# Patient Record
Sex: Female | Born: 1960 | Race: White | Hispanic: No | Marital: Married | Smoking: Current every day smoker
Health system: Southern US, Community
[De-identification: ages and names within clinical notes are randomized; demographics above are authoritative.]

## PROBLEM LIST (undated history)

## (undated) DIAGNOSIS — Z720 Tobacco use: Secondary | ICD-10-CM

## (undated) DIAGNOSIS — R011 Cardiac murmur, unspecified: Secondary | ICD-10-CM

## (undated) DIAGNOSIS — I358 Other nonrheumatic aortic valve disorders: Secondary | ICD-10-CM

## (undated) DIAGNOSIS — I10 Essential (primary) hypertension: Secondary | ICD-10-CM

## (undated) DIAGNOSIS — F419 Anxiety disorder, unspecified: Secondary | ICD-10-CM

## (undated) DIAGNOSIS — Z8249 Family history of ischemic heart disease and other diseases of the circulatory system: Secondary | ICD-10-CM

## (undated) DIAGNOSIS — I5189 Other ill-defined heart diseases: Secondary | ICD-10-CM

## (undated) DIAGNOSIS — T7840XA Allergy, unspecified, initial encounter: Secondary | ICD-10-CM

## (undated) DIAGNOSIS — H9190 Unspecified hearing loss, unspecified ear: Secondary | ICD-10-CM

## (undated) DIAGNOSIS — D649 Anemia, unspecified: Secondary | ICD-10-CM

## (undated) DIAGNOSIS — E785 Hyperlipidemia, unspecified: Secondary | ICD-10-CM

## (undated) DIAGNOSIS — F1910 Other psychoactive substance abuse, uncomplicated: Secondary | ICD-10-CM

## (undated) DIAGNOSIS — I739 Peripheral vascular disease, unspecified: Secondary | ICD-10-CM

## (undated) DIAGNOSIS — Z9289 Personal history of other medical treatment: Secondary | ICD-10-CM

## (undated) HISTORY — DX: Other psychoactive substance abuse, uncomplicated: F19.10

## (undated) HISTORY — DX: Other nonrheumatic aortic valve disorders: I35.8

## (undated) HISTORY — DX: Tobacco use: Z72.0

## (undated) HISTORY — DX: Hyperlipidemia, unspecified: E78.5

## (undated) HISTORY — DX: Family history of ischemic heart disease and other diseases of the circulatory system: Z82.49

## (undated) HISTORY — DX: Allergy, unspecified, initial encounter: T78.40XA

## (undated) HISTORY — PX: CHOLECYSTECTOMY: SHX55

## (undated) HISTORY — DX: Personal history of other medical treatment: Z92.89

## (undated) HISTORY — PX: SKIN CANCER EXCISION: SHX779

## (undated) HISTORY — DX: Essential (primary) hypertension: I10

## (undated) HISTORY — DX: Anxiety disorder, unspecified: F41.9

## (undated) HISTORY — PX: ABDOMINAL HYSTERECTOMY: SHX81

## (undated) HISTORY — DX: Cardiac murmur, unspecified: R01.1

## (undated) HISTORY — DX: Other ill-defined heart diseases: I51.89

---

## 2008-02-29 ENCOUNTER — Ambulatory Visit: Payer: Self-pay | Admitting: Unknown Physician Specialty

## 2011-07-22 LAB — CBC
HGB: 16.4 g/dL — ABNORMAL HIGH (ref 12.0–16.0)
MCH: 32.4 pg (ref 26.0–34.0)
MCHC: 32.5 g/dL (ref 32.0–36.0)
MCV: 100 fL (ref 80–100)
RBC: 5.07 10*6/uL (ref 3.80–5.20)
WBC: 11.7 10*3/uL — ABNORMAL HIGH (ref 3.6–11.0)

## 2011-07-22 LAB — COMPREHENSIVE METABOLIC PANEL
Alkaline Phosphatase: 107 U/L (ref 50–136)
Anion Gap: 11 (ref 7–16)
Bilirubin,Total: 0.5 mg/dL (ref 0.2–1.0)
Calcium, Total: 9.7 mg/dL (ref 8.5–10.1)
Co2: 27 mmol/L (ref 21–32)
Creatinine: 0.78 mg/dL (ref 0.60–1.30)
Osmolality: 282 (ref 275–301)
Potassium: 3.7 mmol/L (ref 3.5–5.1)
SGOT(AST): 16 U/L (ref 15–37)
SGPT (ALT): 20 U/L
Sodium: 141 mmol/L (ref 136–145)

## 2011-07-22 LAB — URINALYSIS, COMPLETE
Hyaline Cast: 6
Nitrite: NEGATIVE
Protein: 100
RBC,UR: 3 /HPF (ref 0–5)
Specific Gravity: 1.028 (ref 1.003–1.030)

## 2011-07-23 ENCOUNTER — Inpatient Hospital Stay: Payer: Self-pay | Admitting: Internal Medicine

## 2011-07-23 DIAGNOSIS — I059 Rheumatic mitral valve disease, unspecified: Secondary | ICD-10-CM

## 2011-07-23 LAB — CK TOTAL AND CKMB (NOT AT ARMC)
CK, Total: 42 U/L (ref 21–215)
CK, Total: 50 U/L (ref 21–215)
CK-MB: 1.4 ng/mL (ref 0.5–3.6)
CK-MB: 0.8 ng/mL (ref 0.5–3.6)

## 2011-07-23 LAB — DRUG SCREEN, URINE
Amphetamines, Ur Screen: NEGATIVE (ref ?–1000)
Benzodiazepine, Ur Scrn: NEGATIVE (ref ?–200)
Methadone, Ur Screen: NEGATIVE (ref ?–300)
Opiate, Ur Screen: POSITIVE (ref ?–300)
Tricyclic, Ur Screen: NEGATIVE (ref ?–1000)

## 2011-07-23 LAB — PRO B NATRIURETIC PEPTIDE: B-Type Natriuretic Peptide: 503 pg/mL — ABNORMAL HIGH (ref 0–125)

## 2011-07-23 LAB — TROPONIN I: Troponin-I: <0.02 ng/mL

## 2011-08-04 ENCOUNTER — Ambulatory Visit (INDEPENDENT_AMBULATORY_CARE_PROVIDER_SITE_OTHER): Payer: PRIVATE HEALTH INSURANCE | Admitting: Family Medicine

## 2011-08-04 ENCOUNTER — Encounter: Payer: Self-pay | Admitting: Family Medicine

## 2011-08-04 VITALS — BP 158/78 | HR 72 | Temp 98.0°F | Ht 65.0 in | Wt 201.0 lb

## 2011-08-04 DIAGNOSIS — F419 Anxiety disorder, unspecified: Secondary | ICD-10-CM | POA: Insufficient documentation

## 2011-08-04 DIAGNOSIS — I10 Essential (primary) hypertension: Secondary | ICD-10-CM

## 2011-08-04 DIAGNOSIS — I152 Hypertension secondary to endocrine disorders: Secondary | ICD-10-CM | POA: Insufficient documentation

## 2011-08-04 DIAGNOSIS — F411 Generalized anxiety disorder: Secondary | ICD-10-CM

## 2011-08-04 LAB — COMPREHENSIVE METABOLIC PANEL
ALT: 30 U/L (ref 0–35)
AST: 21 U/L (ref 0–37)
Creatinine, Ser: 0.8 mg/dL (ref 0.4–1.2)
Total Bilirubin: 0.4 mg/dL (ref 0.3–1.2)

## 2011-08-04 MED ORDER — HYDROCHLOROTHIAZIDE 50 MG PO TABS: 50.0000 mg | ORAL_TABLET | Freq: Every day | ORAL | Status: DC

## 2011-08-04 MED ORDER — ALPRAZOLAM 0.25 MG PO TABS: 0.2500 mg | ORAL_TABLET | Freq: Three times a day (TID) | ORAL | Status: DC | PRN

## 2011-08-04 MED ORDER — METOPROLOL SUCCINATE ER 50 MG PO TB24: 50.0000 mg | ORAL_TABLET | Freq: Every day | ORAL | Status: DC

## 2011-08-04 MED ORDER — POTASSIUM CHLORIDE ER 10 MEQ PO TBCR: 10.0000 meq | EXTENDED_RELEASE_TABLET | Freq: Every day | ORAL | Status: DC

## 2011-08-04 NOTE — Patient Instructions (Addendum)
We are starting potassium - 10 meq daily. We are also starting xanax 0.25 mg three times daily as needed. Please follow up with me in 2 weeks.

## 2011-08-04 NOTE — Progress Notes (Signed)
Subjective:    Patient ID: Sara Roy, female    DOB: 1960/06/29, 51 y.o.   MRN: [id number]  HPI  51 yo here to establish care and for hospital follow up.  Presented to [facility] on 6/14 with "worst head of her life x 6 days."  Had not bee to a physician in many years. Under significant stress and assumed that is why she felt so bad.  Also had been taking OTC decongestants/sudafed.  ER notes reviewed-  Upon arrival BP was 244/134! Given IV hydralazine, norvasc with only minimal improvement.  MRI of brain- No acute findings Head CT without contrast- no acute findings 2 D echo- LVEF 55%, moderate LV hypertrophy  Admitted to hospital and started on coreg, lisinopril and norvasc. Unclear why she was discharged home on HCTZ 50 mg (no potassium supplementation) and Metoprolol 50 mg daily.  Pt unsure as well.  HA has improved a little. Has been normotensive at home- BP running in 120s- 130s systolic.  Has been having panic attacks- and is frequently tearful. No SI or HI.  Patient Active Problem List  Diagnosis  . Anxiety  . Hypertension   Past Medical History  Diagnosis Date  . Anxiety   . Hypertension   . Family history of early CAD   . Tobacco abuse    Past Surgical History  Procedure Date  . Cholecystectomy   . Abdominal hysterectomy    History  Substance Use Topics  . Smoking status: Current Everyday Smoker  . Smokeless tobacco: Not on file  . Alcohol Use: Not on file   Family History  Problem Relation Age of Onset  . Heart disease Father    Allergies  Allergen Reactions  . Penicillins Hives   Current Outpatient Prescriptions on File Prior to Visit  Medication Sig Dispense Refill  . potassium chloride (K-DUR) 10 MEQ tablet Take 1 tablet (10 mEq total) by mouth daily.  30 tablet  1   The PMH, PSH, Social History, Family History, Medications, and allergies have been reviewed in [facility], and have been updated if relevant.    Review of Systems    See  HPI Objective:   Physical Exam BP 158/78  Pulse 72  Temp 98 F (36.7 C)  Ht 5\' 5"  (1.651 m)  Wt 201 lb (91.173 kg)  BMI 33.45 kg/m2  General:  Well-developed,well-nourished,in no acute distress; alert,appropriate and cooperative throughout examination Head:  normocephalic and atraumatic.   Eyes:  vision grossly intact, pupils equal, pupils round, and pupils reactive to light.   Ears:  R ear normal and L ear normal.   Nose:  no external deformity.   Mouth:  good dentition.   Neck:  No deformities, masses, or tenderness noted. Lungs:  Normal respiratory effort, chest expands symmetrically. Lungs are clear to auscultation, no crackles or wheezes. Heart:  Normal rate and regular rhythm. S1 and S2 normal without gallop, murmur, click, rub or other extra sounds. Msk:  No deformity or scoliosis noted of thoracic or lumbar spine.   Extremities:  No clubbing, cyanosis, edema, or deformity noted with normal full range of motion of all joints.   Neurologic:  alert & oriented X3 and gait normal.   Skin:  Intact without suspicious lesions or rashes Psych:  Cognition and judgment appear intact. Alert and cooperative with normal attention span and concentration. No apparent delusions, illusions, hallucinations      Assessment & Plan:   1. HTN (hypertension)  Complicated situation- unclear as to why she was  sent on her current medications. BP improved- will recheck CMET today since she was not sent home with potassium but is on high dose HCTZ. Start Kdur 10 meq daily. Follow up in 2 weeks. The patient indicates understanding of these issues and agrees with the plan.  Comprehensive metabolic panel  2. Family h/o CAD- Very high risk- she is also a smoker.  I cannot find a lipid panel in hospital records. Will order lipids in 2 weeks.    3. Anxiety  Deteriorated. Discussed different tx options. Will start xanax for now- as needed for panic attacks.  Reasses in two weeks.  She does not yet  want to start SSRI.  Has had issues with ETOH abuse in past, advised against drinking ETOH with benzo. The patient indicates understanding of these issues and agrees with the plan.

## 2011-08-18 ENCOUNTER — Encounter: Payer: Self-pay | Admitting: Family Medicine

## 2011-08-18 ENCOUNTER — Ambulatory Visit (INDEPENDENT_AMBULATORY_CARE_PROVIDER_SITE_OTHER): Payer: PRIVATE HEALTH INSURANCE | Admitting: Family Medicine

## 2011-08-18 ENCOUNTER — Other Ambulatory Visit: Payer: Self-pay | Admitting: Family Medicine

## 2011-08-18 VITALS — BP 142/84 | HR 72 | Temp 98.1°F | Wt 204.0 lb

## 2011-08-18 DIAGNOSIS — F419 Anxiety disorder, unspecified: Secondary | ICD-10-CM

## 2011-08-18 DIAGNOSIS — D72829 Elevated white blood cell count, unspecified: Secondary | ICD-10-CM | POA: Insufficient documentation

## 2011-08-18 DIAGNOSIS — R5381 Other malaise: Secondary | ICD-10-CM

## 2011-08-18 DIAGNOSIS — I10 Essential (primary) hypertension: Secondary | ICD-10-CM

## 2011-08-18 DIAGNOSIS — Z136 Encounter for screening for cardiovascular disorders: Secondary | ICD-10-CM

## 2011-08-18 DIAGNOSIS — R5383 Other fatigue: Secondary | ICD-10-CM | POA: Insufficient documentation

## 2011-08-18 DIAGNOSIS — D582 Other hemoglobinopathies: Secondary | ICD-10-CM

## 2011-08-18 DIAGNOSIS — F411 Generalized anxiety disorder: Secondary | ICD-10-CM

## 2011-08-18 LAB — CBC WITH DIFFERENTIAL/PLATELET
Basophils Absolute: 0 10*3/uL (ref 0.0–0.1)
Basophils Relative: 0.4 % (ref 0.0–3.0)
Eosinophils Absolute: 0.3 10*3/uL (ref 0.0–0.7)
Lymphocytes Relative: 26.7 % (ref 12.0–46.0)
MCHC: 33.8 g/dL (ref 30.0–36.0)
Neutrophils Relative %: 64.2 % (ref 43.0–77.0)
Platelets: 316 10*3/uL (ref 150.0–400.0)
RBC: 5.32 Mil/uL — ABNORMAL HIGH (ref 3.87–5.11)

## 2011-08-18 LAB — COMPREHENSIVE METABOLIC PANEL
AST: 20 U/L (ref 0–37)
Albumin: 3.8 g/dL (ref 3.5–5.2)
BUN: 20 mg/dL (ref 6–23)
Calcium: 9.7 mg/dL (ref 8.4–10.5)
Chloride: 97 mEq/L (ref 96–112)
Glucose, Bld: 111 mg/dL — ABNORMAL HIGH (ref 70–99)
Potassium: 4.1 mEq/L (ref 3.5–5.1)

## 2011-08-18 LAB — TSH: TSH: 1 u[IU]/mL (ref 0.35–5.50)

## 2011-08-18 LAB — LIPID PANEL: VLDL: 32.2 mg/dL (ref 0.0–40.0)

## 2011-08-18 LAB — LDL CHOLESTEROL, DIRECT: Direct LDL: 162.7 mg/dL

## 2011-08-18 MED ORDER — LISINOPRIL-HYDROCHLOROTHIAZIDE 20-25 MG PO TABS: 1.0000 | ORAL_TABLET | Freq: Every day | ORAL | Status: DC

## 2011-08-18 NOTE — Patient Instructions (Addendum)
It was good to see you. Please stop the metoprolol, HCTZ and Kdur (potassium). We are starting a new blood pressure medication. Please come see me in 1-2 weeks.

## 2011-08-18 NOTE — Progress Notes (Signed)
Subjective:    Patient ID: Sara Roy, female    DOB: [birth year], 51 y.o.   MRN: [id number]  HPI  51 yo here for BP follow up. Initially saw her two weeks ago for hospital follow up.  Presented to [facility] on 6/14 with "worst head of her life x 6 days."  Had not bee to a physician in many years. Under significant stress and assumed that is why she felt so bad.  Also had been taking OTC decongestants/sudafed.  ER notes reviewed-  Upon arrival BP was 244/134! Given IV hydralazine, norvasc with only minimal improvement.  MRI of brain- No acute findings Head CT without contrast- no acute findings 2 D echo- LVEF 55%, moderate LV hypertrophy  Admitted to hospital and started on coreg, lisinopril and norvasc. Unclear why she was discharged home on HCTZ 50 mg (no potassium supplementation) and Metoprolol 50 mg daily- ? Possible adverse rxn.  Pt unsure as well, said she vomited once but had not eaten in two days. Nothing in hospital records indicate and adverse reaction.  Started Kdur at last office visit and continued previous medication. Lab Results  Component Value Date   NA 140 08/04/2011   K 3.5 08/04/2011   CL 96 08/04/2011   CO2 35* 08/04/2011     Has been having panic attacks- and is frequently tearful. No SI or HI. Started Xanax as needed at last office visit.  Overall, HA has improved but she feels more fatigued. No SOB or CP.  Patient Active Problem List  Diagnosis  . Anxiety  . Hypertension   Past Medical History  Diagnosis Date  . Anxiety   . Hypertension   . Family history of early CAD   . Tobacco abuse    Past Surgical History  Procedure Date  . Cholecystectomy   . Abdominal hysterectomy    History  Substance Use Topics  . Smoking status: Current Everyday Smoker  . Smokeless tobacco: Not on file  . Alcohol Use: Not on file   Family History  Problem Relation Age of Onset  . Heart disease Father    Allergies  Allergen Reactions  . Penicillins Hives     Current Outpatient Prescriptions on File Prior to Visit  Medication Sig Dispense Refill  . ALPRAZolam (XANAX) 0.25 MG tablet Take 1 tablet (0.25 mg total) by mouth 3 (three) times daily as needed for sleep or anxiety.  90 tablet  0  . aspirin 325 MG tablet Take 325 mg by mouth daily.      . hydrochlorothiazide (HYDRODIURIL) 50 MG tablet Take 1 tablet (50 mg total) by mouth daily.      . metoprolol succinate (TOPROL-XL) 50 MG 24 hr tablet Take 1 tablet (50 mg total) by mouth daily. Take with or immediately following a meal.      . potassium chloride (K-DUR) 10 MEQ tablet Take 1 tablet (10 mEq total) by mouth daily.  30 tablet  1   The PMH, PSH, Social History, Family History, Medications, and allergies have been reviewed in [facility], and have been updated if relevant.    Review of Systems    See HPI Objective:   Physical Exam BP 142/84  Pulse 72  Temp 98.1 F (36.7 C)  Wt 204 lb (92.534 kg)  General:  Well-developed,well-nourished,in no acute distress; alert,appropriate and cooperative throughout examination Head:  normocephalic and atraumatic.   Eyes:  vision grossly intact, pupils equal, pupils round, and pupils reactive to light.   Ears:  R ear  normal and L ear normal.   Nose:  no external deformity.   Mouth:  good dentition.   Neck:  No deformities, masses, or tenderness noted. Lungs:  Normal respiratory effort, chest expands symmetrically. Lungs are clear to auscultation, no crackles or wheezes. Heart:  Normal rate and regular rhythm. S1 and S2 normal without gallop, murmur, click, rub or other extra sounds. Msk:  No deformity or scoliosis noted of thoracic or lumbar spine.   Extremities:  No clubbing, cyanosis, edema, or deformity noted with normal full range of motion of all joints.   Neurologic:  alert & oriented X3 and gait normal.   Skin:  Intact without suspicious lesions or rashes Psych:  Cognition and judgment appear intact. Alert and cooperative with normal attention  span and concentration. No apparent delusions, illusions, hallucinations      Assessment & Plan:   1. HTN (hypertension)  Improved but now with profound fatigue.  Remains unclear as to why she was sent on her current medications. Since no adverse reaction documented to ACEI and pt only states that she vomited once, will d/c metoprolol as it is likely contributing to her fatigue.  D/c HCTZ at current dose- pt reports urinating too frequently. Start ACEI- HCTZ combo- d/c Kdur as we have decreased dose of diuretic. Follow up in 1 week. Comprehensive metabolic panel  2. Family h/o CAD- Very high risk- she is also a smoker.   Lipid panel today.   3. Fatigue - see above. Likely multifactorial. Hopefully will improve after stopping metoprolol. Check labs to rule out other possible contributing factors. Orders Placed This Encounter  Procedures  . Lipid Panel  . Comprehensive metabolic panel  . CBC with Differential  . TSH  . Vitamin D, 25-hydroxy

## 2011-08-19 ENCOUNTER — Telehealth: Payer: Self-pay | Admitting: Family Medicine

## 2011-08-19 LAB — VITAMIN D 25 HYDROXY (VIT D DEFICIENCY, FRACTURES): Vit D, 25-Hydroxy: 20 ng/mL — ABNORMAL LOW (ref 30–89)

## 2011-08-19 MED ORDER — LOSARTAN POTASSIUM-HCTZ 50-12.5 MG PO TABS: 1.0000 | ORAL_TABLET | Freq: Every day | ORAL | Status: DC

## 2011-08-19 NOTE — Telephone Encounter (Signed)
Jacki Cones said to send to Dr Dayton Martes.

## 2011-08-19 NOTE — Telephone Encounter (Signed)
Caller: Honor/Patient; PCP: Ruthe Mannan (Nestor Ramp); CB#: [phone]; Call regarding Rash/Hives; Onset after first dose of new medication - Lisinopril/ HCTZ.  Rash noted on arms only so far, not itchy.  Splotchy rash primarily on forearms reported.   Emergent sx ruled out.  Call provider in 4 hours per Rash protocol.  Information noted and sent to office for follow up.  Confirmed pharmacy information with patient.

## 2011-08-19 NOTE — Telephone Encounter (Signed)
Called pt- instructed her to STOP the lisinopril/HCTZ although rash has improved. Will add lisinopril to her allergy list. Will send in rx for HCTZ-losartan. She has follow up appt scheduled with me.

## 2011-08-20 ENCOUNTER — Other Ambulatory Visit: Payer: Self-pay | Admitting: Family Medicine

## 2011-08-20 ENCOUNTER — Other Ambulatory Visit (INDEPENDENT_AMBULATORY_CARE_PROVIDER_SITE_OTHER): Payer: PRIVATE HEALTH INSURANCE

## 2011-08-20 DIAGNOSIS — D582 Other hemoglobinopathies: Secondary | ICD-10-CM

## 2011-08-20 MED ORDER — HYDROCHLOROTHIAZIDE 25 MG PO TABS: 25.0000 mg | ORAL_TABLET | Freq: Every day | ORAL | Status: DC

## 2011-08-20 MED ORDER — AMLODIPINE BESYLATE 5 MG PO TABS: 5.0000 mg | ORAL_TABLET | Freq: Every day | ORAL | Status: DC

## 2011-08-20 NOTE — Progress Notes (Signed)
Pt came in for lab draw- losartan label states not to take since she is allergic to PCN. Advised not to start it. Will add to allergy list. Called in rx for HCTZ and amlopidine. Follow up as planned.

## 2011-08-21 ENCOUNTER — Other Ambulatory Visit: Payer: Self-pay | Admitting: Family Medicine

## 2011-08-21 DIAGNOSIS — R7989 Other specified abnormal findings of blood chemistry: Secondary | ICD-10-CM

## 2011-08-21 LAB — CBC WITH DIFFERENTIAL/PLATELET
Basophils Absolute: 0 10*3/uL (ref 0.0–0.1)
HCT: 46 % (ref 36.0–46.0)
Lymphocytes Relative: 28 % (ref 12–46)
Neutro Abs: 7.4 10*3/uL (ref 1.7–7.7)
Neutrophils Relative %: 64 % (ref 43–77)
Platelets: 325 10*3/uL (ref 150–400)
RDW: 13.7 % (ref 11.5–15.5)
WBC: 11.6 10*3/uL — ABNORMAL HIGH (ref 4.0–10.5)

## 2011-08-21 LAB — D-DIMER, QUANTITATIVE: D-Dimer, Quant: 0.3 ug/mL-FEU (ref 0.00–0.48)

## 2011-09-01 ENCOUNTER — Ambulatory Visit (INDEPENDENT_AMBULATORY_CARE_PROVIDER_SITE_OTHER): Payer: PRIVATE HEALTH INSURANCE | Admitting: Family Medicine

## 2011-09-01 ENCOUNTER — Encounter: Payer: Self-pay | Admitting: Family Medicine

## 2011-09-01 ENCOUNTER — Ambulatory Visit: Payer: Self-pay | Admitting: Family Medicine

## 2011-09-01 VITALS — BP 142/82 | HR 84 | Temp 98.3°F | Wt 203.0 lb

## 2011-09-01 DIAGNOSIS — F411 Generalized anxiety disorder: Secondary | ICD-10-CM

## 2011-09-01 DIAGNOSIS — I10 Essential (primary) hypertension: Secondary | ICD-10-CM

## 2011-09-01 DIAGNOSIS — F419 Anxiety disorder, unspecified: Secondary | ICD-10-CM

## 2011-09-01 MED ORDER — POTASSIUM CHLORIDE ER 10 MEQ PO TBCR: 5.0000 meq | EXTENDED_RELEASE_TABLET | Freq: Every day | ORAL | Status: DC

## 2011-09-01 NOTE — Progress Notes (Signed)
Subjective:    Patient ID: Sara Roy, female    DOB: [birth year], 51 y.o.   MRN: [id number]  HPI  51 yo here for BP follow up. Initially saw her earlier this month for hospital follow up.  Presented to [facility] on 6/14 with "worst head of her life x 6 days."  Had not bee to a physician in many years. Under significant stress and assumed that is why she felt so bad.  Also had been taking OTC decongestants/sudafed.  ER notes reviewed-  Upon arrival BP was 244/134! Given IV hydralazine, norvasc with only minimal improvement.  MRI of brain- No acute findings Head CT without contrast- no acute findings 2 D echo- LVEF 55%, moderate LV hypertrophy  Admitted to hospital and started on coreg, lisinopril and norvasc. Unclear why she was discharged home on HCTZ 50 mg (no potassium supplementation) and Metoprolol 50 mg daily- ? Possible adverse rxn.  Pt unsure as well, said she vomited once but had not eaten in two days. Nothing in hospital records indicate and adverse reaction.  Attempted to start her on ACEI but she developed a rash. On 7/12, rx for amlodipine and HCTZ called in.  Lab Results  Component Value Date   NA 137 08/18/2011   K 4.1 08/18/2011   CL 97 08/18/2011   CO2 28 08/18/2011     Has been having panic attacks- and is frequently tearful. No SI or HI. Started Xanax as needed at last office visit.  Overall, HA has improved but she feels more fatigued. No SOB or CP.  Abnormal CBC- Lab Results  Component Value Date   WBC 11.6* 08/20/2011   HGB 16.3* 08/20/2011   HCT 46.0 08/20/2011   MCV 87.8 08/20/2011   PLT 325 08/20/2011   Elevated Hgb and WBC.  D dimer negative.  Stable- heavy smoker.  Patient Active Problem List  Diagnosis  . Anxiety  . Hypertension  . Fatigue  . Leukocytosis  . Elevated hemoglobin   Past Medical History  Diagnosis Date  . Anxiety   . Hypertension   . Family history of early CAD   . Tobacco abuse    Past Surgical History  Procedure Date    . Cholecystectomy   . Abdominal hysterectomy    History  Substance Use Topics  . Smoking status: Current Everyday Smoker  . Smokeless tobacco: Not on file  . Alcohol Use: Not on file   Family History  Problem Relation Age of Onset  . Heart disease Father    Allergies  Allergen Reactions  . Lisinopril     Rash   . Losartan   . Penicillins Hives   Current Outpatient Prescriptions on File Prior to Visit  Medication Sig Dispense Refill  . ALPRAZolam (XANAX) 0.25 MG tablet Take 1 tablet (0.25 mg total) by mouth 3 (three) times daily as needed for sleep or anxiety.  90 tablet  0  . amLODipine (NORVASC) 5 MG tablet Take 1 tablet (5 mg total) by mouth daily.  90 tablet  3  . aspirin 325 MG tablet Take 325 mg by mouth daily.      . hydrochlorothiazide (HYDRODIURIL) 25 MG tablet Take 1 tablet (25 mg total) by mouth daily.  30 tablet  1   The PMH, PSH, Social History, Family History, Medications, and allergies have been reviewed in [facility], and have been updated if relevant.    Review of Systems    See HPI Objective:   Physical Exam BP 142/82  Pulse  84  Temp 98.3 F (36.8 C)  Wt 203 lb (92.08 kg)  General:  Well-developed,well-nourished,in no acute distress; alert,appropriate and cooperative throughout examination Head:  normocephalic and atraumatic.   Eyes:  vision grossly intact, pupils equal, pupils round, and pupils reactive to light.   Ears:  R ear normal and L ear normal.   Nose:  no external deformity.   Mouth:  good dentition.   Neck:  No deformities, masses, or tenderness noted. Lungs:  Normal respiratory effort, chest expands symmetrically. Lungs are clear to auscultation, no crackles or wheezes. Heart:  Normal rate and regular rhythm. S1 and S2 normal without gallop, murmur, click, rub or other extra sounds. Msk:  No deformity or scoliosis noted of thoracic or lumbar spine.   Extremities:  No clubbing, cyanosis, edema, or deformity noted with normal full range of  motion of all joints.   Neurologic:  alert & oriented X3 and gait normal.   Skin:  Intact without suspicious lesions or rashes Psych:  Cognition and judgment appear intact. Alert and cooperative with normal attention span and concentration. No apparent delusions, illusions, hallucinations      Assessment & Plan:   1. HTN (hypertension)  Improved and now feeling less fatigued. Follow up in 2-3 months.   2. Abnormal CBC Likely due to long term tobacco use. Repeat labs scheduled for next month.

## 2011-09-01 NOTE — Patient Instructions (Addendum)
Good to see you. I will call you with your lab results in August. Please come see me in a few months and we address the concerns we talked about.

## 2011-09-09 ENCOUNTER — Telehealth: Payer: Self-pay | Admitting: *Deleted

## 2011-09-09 DIAGNOSIS — M549 Dorsalgia, unspecified: Secondary | ICD-10-CM

## 2011-09-09 NOTE — Telephone Encounter (Signed)
Order entered

## 2011-09-09 NOTE — Telephone Encounter (Signed)
Patient called and asked that you go ahead and refer her for MRI of her back. Her pain is still there and she would like to know the source. It isn't any worse, but it isn't any better.

## 2011-09-09 NOTE — Telephone Encounter (Signed)
We would need to get an xray first.  Please have her come for lumbar xray or office visit.

## 2011-09-09 NOTE — Telephone Encounter (Signed)
Advised patient.  She will come in tomorrow for x-ray.

## 2011-09-10 ENCOUNTER — Other Ambulatory Visit: Payer: Self-pay | Admitting: Family Medicine

## 2011-09-10 ENCOUNTER — Ambulatory Visit (INDEPENDENT_AMBULATORY_CARE_PROVIDER_SITE_OTHER)
Admission: RE | Admit: 2011-09-10 | Discharge: 2011-09-10 | Disposition: A | Discharge status: Home / Self Care | Payer: PRIVATE HEALTH INSURANCE | Source: Ambulatory Visit | Attending: Family Medicine | Admitting: Family Medicine

## 2011-09-10 DIAGNOSIS — M549 Dorsalgia, unspecified: Secondary | ICD-10-CM

## 2011-09-10 DIAGNOSIS — M545 Low back pain: Secondary | ICD-10-CM

## 2011-09-13 ENCOUNTER — Other Ambulatory Visit: Payer: Self-pay | Admitting: Family Medicine

## 2011-09-18 ENCOUNTER — Other Ambulatory Visit: Payer: PRIVATE HEALTH INSURANCE

## 2011-09-21 ENCOUNTER — Other Ambulatory Visit: Payer: Self-pay | Admitting: Family Medicine

## 2011-09-21 DIAGNOSIS — D72829 Elevated white blood cell count, unspecified: Secondary | ICD-10-CM

## 2011-09-23 ENCOUNTER — Other Ambulatory Visit (INDEPENDENT_AMBULATORY_CARE_PROVIDER_SITE_OTHER): Payer: PRIVATE HEALTH INSURANCE

## 2011-09-23 ENCOUNTER — Other Ambulatory Visit: Payer: Self-pay | Admitting: Family Medicine

## 2011-09-23 ENCOUNTER — Encounter: Payer: Self-pay | Admitting: Family Medicine

## 2011-09-23 ENCOUNTER — Ambulatory Visit (INDEPENDENT_AMBULATORY_CARE_PROVIDER_SITE_OTHER): Payer: PRIVATE HEALTH INSURANCE | Admitting: Family Medicine

## 2011-09-23 VITALS — BP 120/64 | HR 60 | Temp 98.2°F | Wt 205.0 lb

## 2011-09-23 DIAGNOSIS — D72829 Elevated white blood cell count, unspecified: Secondary | ICD-10-CM

## 2011-09-23 DIAGNOSIS — M79605 Pain in left leg: Secondary | ICD-10-CM | POA: Insufficient documentation

## 2011-09-23 DIAGNOSIS — M545 Low back pain: Secondary | ICD-10-CM

## 2011-09-23 LAB — CBC WITH DIFFERENTIAL/PLATELET
Basophils Relative: 0.4 % (ref 0.0–3.0)
Eosinophils Absolute: 0.3 10*3/uL (ref 0.0–0.7)
Eosinophils Relative: 2.2 % (ref 0.0–5.0)
Hemoglobin: 15.1 g/dL — ABNORMAL HIGH (ref 12.0–15.0)
Lymphocytes Relative: 25.5 % (ref 12.0–46.0)
MCHC: 32.4 g/dL (ref 30.0–36.0)
MCV: 92.6 fl (ref 78.0–100.0)
Monocytes Absolute: 0.9 10*3/uL (ref 0.1–1.0)
Neutro Abs: 10.1 10*3/uL — ABNORMAL HIGH (ref 1.4–7.7)
Neutrophils Relative %: 65.9 % (ref 43.0–77.0)
RBC: 5.04 Mil/uL (ref 3.87–5.11)
WBC: 15.3 10*3/uL — ABNORMAL HIGH (ref 4.5–10.5)

## 2011-09-23 MED ORDER — LIDOCAINE 5 % EX PTCH: 1.0000 | MEDICATED_PATCH | CUTANEOUS | Status: AC

## 2011-09-23 NOTE — Progress Notes (Signed)
Subjective:    Patient ID: Sara Roy, female    DOB: 1960/04/19, 52 y.o.   MRN: [id number]  HPI  51 yo here for back pain x 2 years but progressively within last 6 months.   Progressive back pain for past several month. Low back- radiates to left leg. She is starting to noticed that her left leg "may give way."  Has tried alleve or ibuprofen for months- pain continues to progress.   Dg Lumbar Spine Complete  09/10/2011  *RADIOLOGY REPORT*  Clinical Data: Back pain.  LUMBAR SPINE - COMPLETE 4+ VIEW  Comparison: None.  Findings: There are five lumbar-type vertebral bodies.  No fracture or malalignment.  Disc spaces well maintained.  SI joints are symmetric.  IMPRESSION: No acute findings.  Original Report Authenticated By: Cyndie Chime, M.D.    Patient Active Problem List  Diagnosis  . Anxiety  . Hypertension  . Fatigue  . Leukocytosis  . Elevated hemoglobin   Past Medical History  Diagnosis Date  . Anxiety   . Hypertension   . Family history of early CAD   . Tobacco abuse    Past Surgical History  Procedure Date  . Cholecystectomy   . Abdominal hysterectomy    History  Substance Use Topics  . Smoking status: Current Everyday Smoker  . Smokeless tobacco: Not on file  . Alcohol Use: Not on file   Family History  Problem Relation Age of Onset  . Heart disease Father    Allergies  Allergen Reactions  . Lisinopril     Rash   . Losartan   . Penicillins Hives   Current Outpatient Prescriptions on File Prior to Visit  Medication Sig Dispense Refill  . amLODipine (NORVASC) 5 MG tablet Take 1 tablet (5 mg total) by mouth daily.  90 tablet  3  . aspirin 325 MG tablet Take 325 mg by mouth daily.      . hydrochlorothiazide (HYDRODIURIL) 25 MG tablet Take 1 tablet (25 mg total) by mouth daily.  30 tablet  1  . potassium chloride (K-DUR) 10 MEQ tablet Take 0.5 tablets (5 mEq total) by mouth daily.  30 tablet  1   The PMH, PSH, Social History, Family History,  Medications, and allergies have been reviewed in [facility], and have been updated if relevant.    Review of Systems    See HPI Objective:   Physical Exam BP 120/64  Pulse 60  Temp 98.2 F (36.8 C) (Oral)  Wt 205 lb (92.987 kg)  General:  Well-developed,well-nourished,in no acute distress; alert,appropriate and cooperative throughout examination Head:  normocephalic and atraumatic.   Eyes:  vision grossly intact, pupils equal, pupils round, and pupils reactive to light.   Ears:  R ear normal and L ear normal.   Nose:  no external deformity.   Mouth:  good dentition.   Neck:  No deformities, masses, or tenderness noted. Lungs:  Normal respiratory effort, chest expands symmetrically. Lungs are clear to auscultation, no crackles or wheezes. Heart:  Normal rate and regular rhythm. S1 and S2 normal without gallop, murmur, click, rub or other extra sounds. Msk:  No deformity or scoliosis noted of thoracic or lumbar spine.   Extremities:  No clubbing, cyanosis, edema, or deformity noted with normal full range of motion of all joints.   +SLR left Neg fabers Neurologic:  alert & oriented X3 and gait normal.   Normal reflexes bilaterally Skin:  Intact without suspicious lesions or rashes Psych:  Cognition and judgment  appear intact. Alert and cooperative with normal attention span and concentration. No apparent delusions, illusions, hallucinations      Assessment & Plan:    1. Lumbar pain with radiation down left leg  Deteriorated. I am concerned for disc pathology and or spinal stenosis. Will order MRI of lumbar spine. Start Lidoderm patches. The patient indicates understanding of these issues and agrees with the plan.

## 2011-09-23 NOTE — Patient Instructions (Addendum)
Please stop by to see Shirlee Limerick on your way out. Call me next week, let me know how the lidoderm patch is working.

## 2011-09-27 ENCOUNTER — Ambulatory Visit: Payer: Self-pay | Admitting: Family Medicine

## 2011-10-05 ENCOUNTER — Other Ambulatory Visit: Payer: Self-pay | Admitting: Family Medicine

## 2011-10-05 ENCOUNTER — Ambulatory Visit
Admission: RE | Admit: 2011-10-05 | Discharge: 2011-10-05 | Disposition: A | Discharge status: Home / Self Care | Payer: PRIVATE HEALTH INSURANCE | Source: Ambulatory Visit | Attending: Family Medicine | Admitting: Family Medicine

## 2011-10-05 DIAGNOSIS — M545 Low back pain: Secondary | ICD-10-CM

## 2011-10-05 DIAGNOSIS — M549 Dorsalgia, unspecified: Secondary | ICD-10-CM

## 2011-10-07 ENCOUNTER — Ambulatory Visit: Payer: Self-pay | Admitting: Oncology

## 2011-10-07 LAB — CBC CANCER CENTER
Basophil %: 0.4 %
Eosinophil #: 0.4 x10 3/mm (ref 0.0–0.7)
HCT: 46.6 % (ref 35.0–47.0)
HGB: 15.2 g/dL (ref 12.0–16.0)
Lymphocyte %: 25.8 %
MCHC: 32.6 g/dL (ref 32.0–36.0)
Monocyte %: 6.8 %
Neutrophil #: 9.8 x10 3/mm — ABNORMAL HIGH (ref 1.4–6.5)
Neutrophil %: 64.6 %
Platelet: 298 x10 3/mm (ref 150–440)
RDW: 13.7 % (ref 11.5–14.5)
WBC: 15.1 x10 3/mm — ABNORMAL HIGH (ref 3.6–11.0)

## 2011-10-07 LAB — LACTATE DEHYDROGENASE: LDH: 144 U/L (ref 81–234)

## 2011-10-10 ENCOUNTER — Ambulatory Visit: Payer: Self-pay | Admitting: Oncology

## 2011-10-25 ENCOUNTER — Other Ambulatory Visit: Payer: Self-pay | Admitting: *Deleted

## 2011-10-25 MED ORDER — HYDROCHLOROTHIAZIDE 25 MG PO TABS: 25.0000 mg | ORAL_TABLET | Freq: Every day | ORAL | Status: DC

## 2011-11-04 ENCOUNTER — Other Ambulatory Visit: Payer: Self-pay

## 2011-11-04 MED ORDER — ALPRAZOLAM 0.25 MG PO TABS: 0.2500 mg | ORAL_TABLET | Freq: Three times a day (TID) | ORAL | Status: DC | PRN

## 2011-11-04 NOTE — Telephone Encounter (Signed)
Medication phoned to pharmacy.  

## 2011-11-04 NOTE — Telephone Encounter (Signed)
Pt request refill alprazolam to [facility] [address].Please advise.

## 2011-11-05 ENCOUNTER — Encounter: Payer: Self-pay | Admitting: Family Medicine

## 2011-11-05 ENCOUNTER — Other Ambulatory Visit: Payer: Self-pay | Admitting: Family Medicine

## 2011-11-05 ENCOUNTER — Ambulatory Visit (INDEPENDENT_AMBULATORY_CARE_PROVIDER_SITE_OTHER): Payer: PRIVATE HEALTH INSURANCE | Admitting: Family Medicine

## 2011-11-05 VITALS — BP 144/68 | HR 80 | Temp 98.1°F | Wt 203.0 lb

## 2011-11-05 DIAGNOSIS — J069 Acute upper respiratory infection, unspecified: Secondary | ICD-10-CM

## 2011-11-05 MED ORDER — AZITHROMYCIN 250 MG PO TABS: ORAL_TABLET | ORAL | Status: DC

## 2011-11-05 MED ORDER — ALPRAZOLAM 0.25 MG PO TABS: ORAL_TABLET | ORAL | Status: DC

## 2011-11-05 MED ORDER — HYDROCOD POLST-CHLORPHEN POLST 10-8 MG/5ML PO LQCR: 5.0000 mL | Freq: Every evening | ORAL | Status: DC | PRN

## 2011-11-05 NOTE — Progress Notes (Signed)
SUBJECTIVE:  Sara Roy is a 51 y.o. female who complains of coryza, congestion, sneezing, sore throat, swollen glands, productive cough, myalgias and bilateral sinus pain for 8 days. She denies a history of anorexia, chest pain, chills and dizziness and denies a history of asthma. Patient admits to smoke cigarettes.   Patient Active Problem List  Diagnosis  . Anxiety  . Hypertension  . Fatigue  . Leukocytosis  . Elevated hemoglobin  . 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 Anxiety   . Hypertension   . Family history of early CAD   . Tobacco abuse    Past Surgical History  Procedure Date  . Cholecystectomy   . Abdominal hysterectomy    History  Substance Use Topics  . Smoking status: Current Every Day Smoker  . Smokeless tobacco: Not on file  . Alcohol Use: Not on file   Family History  Problem Relation Age of Onset  . Heart disease Father    Allergies  Allergen Reactions  . Lisinopril     Rash   . Losartan   . Penicillins Hives   Current Outpatient Prescriptions on File Prior to Visit  Medication Sig Dispense Refill  . ALPRAZolam (XANAX) 0.25 MG tablet TAKE ONE TABLET BY MOUTH THREE TIMES DAILY AS NEEDED FOR SLEEP OR ANXIETY  90 tablet  0  . amLODipine (NORVASC) 5 MG tablet Take 1 tablet (5 mg total) by mouth daily.  90 tablet  3  . aspirin 325 MG tablet Take 325 mg by mouth daily.      . hydrochlorothiazide (HYDRODIURIL) 25 MG tablet Take 1 tablet (25 mg total) by mouth daily.  30 tablet  1  . potassium chloride (K-DUR) 10 MEQ tablet Take 0.5 tablets (5 mEq total) by mouth daily.  30 tablet  1   The PMH, PSH, Social History, Family History, Medications, and allergies have been reviewed in [facility], and have been updated if relevant.  OBJECTIVE: BP 144/68  Pulse 80  Temp 98.1 F (36.7 C)  Wt 203 lb (92.08 kg)  She appears well, vital signs are as noted. Ears normal.  Throat and pharynx normal.  Neck supple. No  adenopathy in the neck. Nose is congested. Sinuses tender. Scattered bilateral expiratory wheezes  ASSESSMENT:  sinusitis and bronchitis  PLAN: Given duration and progression of symptoms, along with smoking history, will treat for bacterial process with Zpack. Symptomatic therapy suggested: push fluids, rest and return office visit prn if symptoms persist or worsen.  Call or return to clinic prn if these symptoms worsen or fail to improve as anticipated. s

## 2011-11-05 NOTE — Telephone Encounter (Signed)
Medicine called to [facility]. 

## 2011-11-05 NOTE — Patient Instructions (Addendum)
Take Zpack as directed.  Drink lots of fluids.  Treat sympotmatically with Mucinex, nasal saline irrigation, and Tylenol/Ibuprofen. You can use warm compresses.  Cough suppressant at night. Call if not improving as expected in 5-7 days.

## 2011-11-08 ENCOUNTER — Telehealth: Payer: Self-pay | Admitting: *Deleted

## 2011-11-08 MED ORDER — BENZONATATE 100 MG PO CAPS: 100.0000 mg | ORAL_CAPSULE | Freq: Two times a day (BID) | ORAL | Status: DC | PRN

## 2011-11-08 NOTE — Telephone Encounter (Signed)
No this will make her drowsy during the day but we could call in tessalon if she is interested to take during the day.

## 2011-11-08 NOTE — Telephone Encounter (Signed)
Pt was seen on Friday for URI.  She has been taking tussionex at night but her cough has gotten worse and she's asking if she can take this during the day as well.

## 2011-11-08 NOTE — Telephone Encounter (Signed)
Advised patient, she's ok with having tessalon sent in.  Uses [facility] [address].

## 2011-11-08 NOTE — Telephone Encounter (Signed)
Rx sent 

## 2011-11-10 ENCOUNTER — Ambulatory Visit (INDEPENDENT_AMBULATORY_CARE_PROVIDER_SITE_OTHER): Payer: PRIVATE HEALTH INSURANCE | Admitting: Family Medicine

## 2011-11-10 ENCOUNTER — Encounter: Payer: Self-pay | Admitting: Family Medicine

## 2011-11-10 VITALS — BP 160/72 | Temp 98.3°F | Wt 197.0 lb

## 2011-11-10 DIAGNOSIS — J40 Bronchitis, not specified as acute or chronic: Secondary | ICD-10-CM

## 2011-11-10 DIAGNOSIS — B370 Candidal stomatitis: Secondary | ICD-10-CM

## 2011-11-10 MED ORDER — DEXAMETHASONE SOD PHOSPHATE PF 10 MG/ML IJ SOLN
10.0000 mg | Freq: Once | INTRAMUSCULAR | Status: AC
  Administered 2011-11-10: 10 mg via INTRAMUSCULAR

## 2011-11-10 MED ORDER — DIPHENHYD-HYDROCORT-NYSTATIN MT SUSP: OROMUCOSAL | Status: DC

## 2011-11-10 MED ORDER — MOXIFLOXACIN HCL 400 MG PO TABS: 400.0000 mg | ORAL_TABLET | Freq: Every day | ORAL | Status: DC

## 2011-11-10 NOTE — Addendum Note (Signed)
Addended by: Eliezer Bottom on: 11/10/2011 12:26 PM   Modules accepted: Orders

## 2011-11-10 NOTE — Patient Instructions (Addendum)
Please hang in there. Ok to increase tussionex two times daily if you are not driving. I have also sent in a prescription for avelox (an antibiotic) and mouthwash to help with your tongue.  Please call us tomorrow with an update.

## 2011-11-10 NOTE — Progress Notes (Signed)
SUBJECTIVE:  Sara Roy is a 51 y.o. female here for follow up cough.   Seen here last week- placed on Zpack for bronchitis/sinusitis.  Finished last dose of Zpack yesterday- cough is actually worsening, now more productive. Remains afebrile.  Tongue is very sore.  Hurts to swallow but no difficulty swallowing.  No CP or SOB.     Patient Active Problem List  Diagnosis  . Anxiety  . Hypertension  . Fatigue  . Leukocytosis  . Elevated hemoglobin  . 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 Anxiety   . Hypertension   . Family history of early CAD   . Tobacco abuse    Past Surgical History  Procedure Date  . Cholecystectomy   . Abdominal hysterectomy    History  Substance Use Topics  . Smoking status: Current Every Day Smoker  . Smokeless tobacco: Not on file  . Alcohol Use: Not on file   Family History  Problem Relation Age of Onset  . Heart disease Father    Allergies  Allergen Reactions  . Lisinopril     Rash   . Losartan   . Penicillins Hives   Current Outpatient Prescriptions on File Prior to Visit  Medication Sig Dispense Refill  . ALPRAZolam (XANAX) 0.25 MG tablet TAKE ONE TABLET BY MOUTH THREE TIMES DAILY AS NEEDED FOR SLEEP OR ANXIETY  90 tablet  0  . amLODipine (NORVASC) 5 MG tablet Take 1 tablet (5 mg total) by mouth daily.  90 tablet  3  . aspirin 325 MG tablet Take 325 mg by mouth daily.      . benzonatate (TESSALON) 100 MG capsule Take 1 capsule (100 mg total) by mouth 2 (two) times daily as needed for cough.  20 capsule  0  . chlorpheniramine-HYDROcodone (TUSSIONEX PENNKINETIC ER) 10-8 MG/5ML LQCR Take 5 mLs by mouth at bedtime as needed.  140 mL  0  . gabapentin (NEURONTIN) 300 MG capsule Take 300 mg by mouth 2 (two) times daily.      . hydrochlorothiazide (HYDRODIURIL) 25 MG tablet Take 1 tablet (25 mg total) by mouth daily.  30 tablet  1  . potassium chloride (K-DUR) 10 MEQ tablet Take 0.5 tablets  (5 mEq total) by mouth daily.  30 tablet  1  . traMADol (ULTRAM) 50 MG tablet Take one to two twice a day       The PMH, PSH, Social History, Family History, Medications, and allergies have been reviewed in [facility], and have been updated if relevant.  OBJECTIVE: BP 160/72  Temp 98.3 F (36.8 C)  Wt 197 lb (89.359 kg)  Actively having coughing spell, vital signs are as noted. Ears normal.  Tongue erythematous with white plaques Neck supple. No adenopathy in the neck. Nose is congested. Sinuses tender. Scattered bilateral expiratory wheezes  ASSESSMENT/PLAN:   1.  Bronchitis/CAP- Probable macrolide resistance. Given rx for Avelox x 7 days. IM decadron in office for inflammation. Continue tussionex.  2.  Thrush- magic mouthwash with nystatin. The patient indicates understanding of these issues and agrees with the plan.

## 2011-11-17 ENCOUNTER — Other Ambulatory Visit: Payer: Self-pay | Admitting: Family Medicine

## 2011-12-19 ENCOUNTER — Other Ambulatory Visit: Payer: Self-pay | Admitting: Family Medicine

## 2012-02-16 ENCOUNTER — Other Ambulatory Visit: Payer: Self-pay | Admitting: Family Medicine

## 2012-02-17 NOTE — Telephone Encounter (Signed)
Medicine called to [facility]. 

## 2012-04-12 ENCOUNTER — Encounter: Payer: Self-pay | Admitting: Family Medicine

## 2012-04-12 ENCOUNTER — Ambulatory Visit (INDEPENDENT_AMBULATORY_CARE_PROVIDER_SITE_OTHER): Payer: PRIVATE HEALTH INSURANCE | Admitting: Family Medicine

## 2012-04-12 DIAGNOSIS — F17200 Nicotine dependence, unspecified, uncomplicated: Secondary | ICD-10-CM

## 2012-04-12 DIAGNOSIS — J029 Acute pharyngitis, unspecified: Secondary | ICD-10-CM

## 2012-04-12 DIAGNOSIS — H6690 Otitis media, unspecified, unspecified ear: Secondary | ICD-10-CM

## 2012-04-12 MED ORDER — AZITHROMYCIN 250 MG PO TABS: ORAL_TABLET | ORAL | Status: DC

## 2012-04-12 NOTE — Progress Notes (Signed)
Nature conservation officer at [facility] [address] [place] [address] Phone: [phone] Fax: [phone]  Date:  04/12/2012   Name:  Sara Roy   DOB:  1960-12-24   MRN:  [id number] Gender: female Age: 52 y.o.  Primary Physician:  Ruthe Mannan, MD    Chief Complaint: Sore Throat, Otalgia and Chills   History of Present Illness:  Sara Roy is a 51 y.o. pleasant patient who presents with the following:  Sore throat, R ear pain, and chills x 1 week. Dry cough and chest discomfort x 1 week.  Right ear is bothering a lot, some headache. Cough is not really bad.   Longstanding smoker.  AF, no diarrhea, no vomitting.  Patient Active Problem List  Diagnosis  . Anxiety  . Hypertension  . Fatigue  . Leukocytosis  . Elevated hemoglobin  . 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 Anxiety   . Hypertension   . Family history of early CAD   . Tobacco abuse     Past Surgical History  Procedure Laterality Date  . Cholecystectomy    . Abdominal hysterectomy      History   Social History  . Marital Status: Married    Spouse Name: N/A    Number of Children: N/A  . Years of Education: N/A   Occupational History  . Not on file.   Social History Main Topics  . Smoking status: Current Every Day Smoker  . Smokeless tobacco: Not on file  . Alcohol Use: Not on file  . Drug Use: Not on file  . Sexually Active: Not on file   Other Topics Concern  . Not on file   Social History Narrative  . No narrative on file    Family History  Problem Relation Age of Onset  . Heart disease Father     Allergies  Allergen Reactions  . Lisinopril     Rash   . Losartan   . Penicillins Hives    Medication list has been reviewed and updated.  Outpatient Prescriptions Prior to Visit  Medication Sig Dispense Refill  . ALPRAZolam (XANAX) 0.25 MG tablet TAKE ONE TABLET BY MOUTH THREE TIMES DAILY AS NEEDED FOR SLEEP OR ANXIETY   90 tablet  0  . amLODipine (NORVASC) 5 MG tablet Take 1 tablet (5 mg total) by mouth daily.  90 tablet  3  . aspirin 325 MG tablet Take 325 mg by mouth daily.      Marland Kitchen gabapentin (NEURONTIN) 300 MG capsule Take 300 mg by mouth 2 (two) times daily.      . hydrochlorothiazide (HYDRODIURIL) 25 MG tablet TAKE ONE TABLET BY MOUTH EVERY DAY  30 tablet  4  . KLOR-CON M10 10 MEQ tablet TAKE ONE TABLET BY MOUTH EVERY DAY  30 tablet  6  . potassium chloride (K-DUR) 10 MEQ tablet Take 0.5 tablets (5 mEq total) by mouth daily.  30 tablet  1  . traMADol (ULTRAM) 50 MG tablet Take one to two twice a day      . benzonatate (TESSALON) 100 MG capsule Take 1 capsule (100 mg total) by mouth 2 (two) times daily as needed for cough.  20 capsule  0  . chlorpheniramine-HYDROcodone (TUSSIONEX PENNKINETIC ER) 10-8 MG/5ML LQCR Take 5 mLs by mouth at bedtime as needed.  140 mL  0  . Diphenhyd-Hydrocort-Nystatin SUSP Please swish 5 ml four times daily until symptoms resolve.  1 Bottle  0  . moxifloxacin (AVELOX) 400 MG tablet Take 1 tablet (400 mg total) by mouth daily.  7 tablet  0   No facility-administered medications prior to visit.    Review of Systems:  No nausea or vomiting, no SOB more than usual  Physical Examination: BP 120/82  Pulse 82  Temp(Src) 98.2 F (36.8 C) (Oral)  Ht 5\' 5"  (1.651 m)  SpO2 96%  Ideal Body Weight: Weight in (lb) to have BMI = 25: 149.9  ROS: GEN: Acute illness details above GI: Tolerating PO intake GU: maintaining adequate hydration and urination Pulm: No SOB Interactive and getting along well at home.  Otherwise, ROS is as per the HPI.    Gen: WDWN, NAD; A & O x3, cooperative. Pleasant.Globally Non-toxic HEENT: Normocephalic and atraumatic. Throat clear, w/o exudate, R TM clear, fluid, L TM - cloudy, reddish fluid. rhinnorhea.  MMM Frontal sinuses: NT Max sinuses: NT NECK: Anterior cervical  LAD is absent CV: RRR, No M/G/R, cap refill <2 sec PULM: Breathing  comfortably in no respiratory distress. no wheezing, crackles, rhonchi EXT: No c/c/e PSYCH: Friendly, good eye contact MSK: Nml gait    Assessment and Plan:   Otitis media, unspecified laterality  Sore throat - Plan: POCT rapid strep A  Smoker  Abnormal ear exam, questionable OM Smoker, ? If undiagnosed COPD  Results for orders placed in visit on 04/12/12  POCT RAPID STREP A (OFFICE)      Result Value Range   Rapid Strep A Screen Negative  Negative     Orders Today:  Orders Placed This Encounter  Procedures  . POCT rapid strep A    Updated Medication List: (Includes new medications, updates to list, dose adjustments) Meds ordered this encounter  Medications  . azithromycin (ZITHROMAX) 250 MG tablet    Sig: 2 tabs po on day 1, then 1 tab po for 4 days    Dispense:  6 tablet    Refill:  0    Medications Discontinued: Medications Discontinued During This Encounter  Medication Reason  . benzonatate (TESSALON) 100 MG capsule Error  . chlorpheniramine-HYDROcodone (TUSSIONEX PENNKINETIC ER) 10-8 MG/5ML LQCR Error  . Diphenhyd-Hydrocort-Nystatin SUSP Error  . moxifloxacin (AVELOX) 400 MG tablet Error  . traMADol (ULTRAM) 50 MG tablet Error  . KLOR-CON M10 10 MEQ tablet Error  . gabapentin (NEURONTIN) 300 MG capsule Error      Signed, Spencer T. Copland, MD 04/12/2012 9:54 AM

## 2012-04-12 NOTE — Patient Instructions (Addendum)
AFRIN NASAL SPRAY twice a day - for the next 3-4 days

## 2012-05-12 ENCOUNTER — Other Ambulatory Visit: Payer: Self-pay | Admitting: Family Medicine

## 2012-05-12 NOTE — Telephone Encounter (Signed)
Medicine called to [facility]. 

## 2012-05-17 ENCOUNTER — Encounter: Payer: Self-pay | Admitting: Family Medicine

## 2012-05-17 ENCOUNTER — Ambulatory Visit (INDEPENDENT_AMBULATORY_CARE_PROVIDER_SITE_OTHER): Payer: PRIVATE HEALTH INSURANCE | Admitting: Family Medicine

## 2012-05-17 VITALS — BP 128/82 | HR 77 | Temp 98.0°F | Wt 212.8 lb

## 2012-05-17 DIAGNOSIS — R21 Rash and other nonspecific skin eruption: Secondary | ICD-10-CM

## 2012-05-17 MED ORDER — FLUOCINONIDE-E 0.05 % EX CREA: TOPICAL_CREAM | Freq: Two times a day (BID) | CUTANEOUS | Status: DC

## 2012-05-17 NOTE — Patient Instructions (Addendum)
Good to see you. Please try Lidex cream to areas twice daily for 2 weeks. Call me in 2 weeks with an update.

## 2012-05-17 NOTE — Progress Notes (Signed)
  Subjective:    Patient ID: Sara Roy, female    DOB: 28-Dec-1960, 52 y.o.   MRN: [id number]  HPI  52 yo pleasant female here for rash on legs bilaterally x 5 months.  Initially was a small spot on her left ankle.  Now it has grown and has multiple other lesions on her legs.  No other lesions elsewhere. Rash is non painful but can be itchy.  Never erythematous or draining.  Has tried neosporin and OTC hydrocortisone with mild relief of symptoms.  Never had anything like this in past.  Patient Active Problem List  Diagnosis  . Anxiety  . Hypertension  . Fatigue  . Leukocytosis  . Elevated hemoglobin  . 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 Anxiety   . Hypertension   . Family history of early CAD   . Tobacco abuse    Past Surgical History  Procedure Laterality Date  . Cholecystectomy    . Abdominal hysterectomy     History  Substance Use Topics  . Smoking status: Current Every Day Smoker  . Smokeless tobacco: Not on file  . Alcohol Use: Not on file   Family History  Problem Relation Age of Onset  . Heart disease Father    Allergies  Allergen Reactions  . Lisinopril     Rash   . Losartan   . Penicillins Hives   Current Outpatient Prescriptions on File Prior to Visit  Medication Sig Dispense Refill  . ALPRAZolam (XANAX) 0.25 MG tablet TAKE ONE TABLET BY MOUTH THREE TIMES DAILY AS NEEDED FOR SLEEP/ANXIETY  90 tablet  0  . amLODipine (NORVASC) 5 MG tablet Take 1 tablet (5 mg total) by mouth daily.  90 tablet  3  . aspirin 325 MG tablet Take 325 mg by mouth daily.      . hydrochlorothiazide (HYDRODIURIL) 25 MG tablet TAKE ONE TABLET BY MOUTH EVERY DAY  30 tablet  4  . potassium chloride (K-DUR) 10 MEQ tablet Take 0.5 tablets (5 mEq total) by mouth daily.  30 tablet  1   No current facility-administered medications on file prior to visit.   The PMH, PSH, Social History, Family History, Medications, and allergies have been  reviewed in [facility], and have been updated if relevant.    Review of Systems    See HPI Objective:   Physical Exam  Constitutional: She appears well-developed and well-nourished. No distress.  Skin: Skin is warm, dry and intact. Rash noted. No bruising, no ecchymosis, no laceration and no petechiae noted. Rash is macular. Nails show no clubbing.      BP 128/82  Pulse 77  Temp(Src) 98 F (36.7 C) (Oral)  Wt 212 lb 12 oz (96.503 kg)  BMI 35.4 kg/m2  SpO2 97%        Assessment & Plan:  1. Rash and nonspecific skin eruption New and progressing. ? Dermatitis vs psorasis or other chronic issue (minimal scaling). Will try two weeks of lidex twice daily. If no improvement, refer to derm. The patient indicates understanding of these issues and agrees with the plan.

## 2012-05-21 ENCOUNTER — Other Ambulatory Visit: Payer: Self-pay | Admitting: Family Medicine

## 2012-08-10 ENCOUNTER — Other Ambulatory Visit: Payer: Self-pay | Admitting: *Deleted

## 2012-08-10 MED ORDER — ALPRAZOLAM 0.25 MG PO TABS: ORAL_TABLET | ORAL | Status: DC

## 2012-08-10 NOTE — Telephone Encounter (Signed)
Refill called to [facility]. 

## 2012-08-10 NOTE — Telephone Encounter (Signed)
Last filled 05/12/12

## 2012-08-16 ENCOUNTER — Other Ambulatory Visit: Payer: Self-pay | Admitting: *Deleted

## 2012-08-16 MED ORDER — AMLODIPINE BESYLATE 5 MG PO TABS: 5.0000 mg | ORAL_TABLET | Freq: Every day | ORAL | Status: DC

## 2012-10-23 ENCOUNTER — Other Ambulatory Visit: Payer: Self-pay | Admitting: *Deleted

## 2012-10-23 MED ORDER — HYDROCHLOROTHIAZIDE 25 MG PO TABS: 25.0000 mg | ORAL_TABLET | Freq: Every day | ORAL | Status: DC

## 2012-10-23 NOTE — Telephone Encounter (Signed)
Received faxed refill request from pharmacy. Refill sent to pharmacy electronically. 

## 2012-11-09 ENCOUNTER — Other Ambulatory Visit: Payer: Self-pay | Admitting: Family Medicine

## 2012-11-09 NOTE — Telephone Encounter (Signed)
Pt needs her Xanax .25 refilled.  She states she has been trying to get this filled for 2 days.  I explained to her that I did not see that [facility] requested this from [place].  [facility] on [address]

## 2012-11-09 NOTE — Telephone Encounter (Signed)
Please phone in rx as entered below. 

## 2012-11-10 MED ORDER — ALPRAZOLAM 0.25 MG PO TABS: ORAL_TABLET | ORAL | Status: DC

## 2012-11-10 NOTE — Telephone Encounter (Signed)
Phoned in to pharmacy. 

## 2013-01-17 ENCOUNTER — Other Ambulatory Visit: Payer: Self-pay | Admitting: *Deleted

## 2013-01-18 ENCOUNTER — Other Ambulatory Visit: Payer: Self-pay | Admitting: *Deleted

## 2013-01-18 ENCOUNTER — Telehealth: Payer: Self-pay

## 2013-01-18 MED ORDER — HYDROCHLOROTHIAZIDE 25 MG PO TABS: 25.0000 mg | ORAL_TABLET | Freq: Every day | ORAL | Status: DC

## 2013-01-18 MED ORDER — AMLODIPINE BESYLATE 5 MG PO TABS: 5.0000 mg | ORAL_TABLET | Freq: Every day | ORAL | Status: DC

## 2013-01-18 MED ORDER — ALPRAZOLAM 0.25 MG PO TABS: ORAL_TABLET | ORAL | Status: DC

## 2013-01-18 MED ORDER — POTASSIUM CHLORIDE CRYS ER 10 MEQ PO TBCR: EXTENDED_RELEASE_TABLET | ORAL | Status: DC

## 2013-01-18 NOTE — Telephone Encounter (Signed)
Pt is aware of Rx sent to pharmacy and Rx left in front office for pick up

## 2013-01-18 NOTE — Telephone Encounter (Signed)
Rx sent through e-scribe I called in the Xanax to pharmacy, but when i called to inform pt, she stated that she did not want the Xanax but the potassium instead. I called pharmacy and told them to cancel the Rx for the Xanax  Pt is aware

## 2013-01-18 NOTE — Telephone Encounter (Signed)
Pt left voicemail with triage requesting refill of medications, pt request call back once complete

## 2013-01-18 NOTE — Addendum Note (Signed)
Addended by: Roena Malady on: 01/18/2013 02:32 PM   Modules accepted: Orders, Medications

## 2013-03-14 ENCOUNTER — Other Ambulatory Visit: Payer: Self-pay | Admitting: Family Medicine

## 2013-03-19 ENCOUNTER — Other Ambulatory Visit: Payer: Self-pay

## 2013-03-19 NOTE — Telephone Encounter (Signed)
Pt [facility] [address] Pt last seen 05/17/12 with no future appt scheduled.Please advise.

## 2013-03-20 MED ORDER — ALPRAZOLAM 0.25 MG PO TABS: ORAL_TABLET | ORAL | Status: DC

## 2013-03-20 MED ORDER — HYDROCHLOROTHIAZIDE 25 MG PO TABS
25.0000 mg | ORAL_TABLET | Freq: Every day | ORAL | Status: DC
Start: 2013-03-19 — End: 2013-05-14

## 2013-03-20 NOTE — Telephone Encounter (Signed)
Alprazolam phoned in to pharmacy.

## 2013-04-04 ENCOUNTER — Ambulatory Visit (INDEPENDENT_AMBULATORY_CARE_PROVIDER_SITE_OTHER): Payer: PRIVATE HEALTH INSURANCE | Admitting: Family Medicine

## 2013-04-04 ENCOUNTER — Encounter: Payer: Self-pay | Admitting: Family Medicine

## 2013-04-04 VITALS — BP 158/92 | HR 88 | Temp 98.0°F | Ht 64.0 in | Wt 208.0 lb

## 2013-04-04 DIAGNOSIS — H6690 Otitis media, unspecified, unspecified ear: Secondary | ICD-10-CM

## 2013-04-04 MED ORDER — AZITHROMYCIN 250 MG PO TABS: ORAL_TABLET | ORAL | Status: DC

## 2013-04-04 NOTE — Progress Notes (Signed)
Pre visit review using our clinic review tool, if applicable. No additional management support is needed unless otherwise documented below in the visit note. 

## 2013-04-04 NOTE — Progress Notes (Signed)
SUBJECTIVE:  Sara Roy is a 53 y.o. female who complains of bilateral sinus pain, bilateral ear pain, subjective fevers, dry cough for 6 days. She denies a history of anorexia, chest pain, sweats, vomiting and weight loss and denies a history of asthma. Patient admits to smoke cigarettes.   Patient Active Problem List   Diagnosis Date Noted  . Rash and nonspecific skin eruption 05/17/2012  . 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 Fatigue 08/18/2011  . Leukocytosis 08/18/2011  . Elevated hemoglobin 08/18/2011  . Anxiety   . Hypertension    Past Medical History  Diagnosis Date  . Anxiety   . Hypertension   . Family history of early CAD   . Tobacco abuse    Past Surgical History  Procedure Laterality Date  . Cholecystectomy    . Abdominal hysterectomy     History  Substance Use Topics  . Smoking status: Current Every Day Smoker  . Smokeless tobacco: Not on file  . Alcohol Use: Not on file   Family History  Problem Relation Age of Onset  . Heart disease Father    Allergies  Allergen Reactions  . Lisinopril     Rash   . Losartan   . Penicillins Hives   Current Outpatient Prescriptions on File Prior to Visit  Medication Sig Dispense Refill  . ALPRAZolam (XANAX) 0.25 MG tablet TAKE ONE TABLET BY MOUTH THREE TIMES DAILY AS NEEDED FOR SLEEP/ANXIETY  90 tablet  0  . amLODipine (NORVASC) 5 MG tablet Take 1 tablet (5 mg total) by mouth daily.  90 tablet  1  . aspirin 325 MG tablet Take 325 mg by mouth daily.      . fluocinonide-emollient (LIDEX-E) 0.05 % cream Apply topically 2 (two) times daily.  30 g  0  . hydrochlorothiazide (HYDRODIURIL) 25 MG tablet Take 1 tablet (25 mg total) by mouth daily.  30 tablet  1  . potassium chloride (K-DUR,KLOR-CON) 10 MEQ tablet Take one tablet by mouth once daily. **WILL NEED FOLLOW UP FOR FURTHER REFILLS**  30 tablet  2   No current facility-administered medications on file prior to visit.   The PMH, PSH, Social History,  Family History, Medications, and allergies [facility], and have been updated if relevant.  OBJECTIVE: BP 158/92  Pulse 88  Temp(Src) 98 F (36.7 C) (Oral)  Ht 5\' 4"  (1.626 m)  Wt 208 lb (94.348 kg)  BMI 35.69 kg/m2  SpO2 93%  She appears well, vital signs are as noted.  Normocephalic and atraumatic. Throat clear, w/o exudate, R TM clear, fluid, L TM - cloudy, reddish fluid. The chest is clear, without wheezes or rales.  ASSESSMENT:  otitis media  PLAN: PCN allergic- will treat with Zpack. Symptomatic therapy suggested: push fluids, rest and return office visit prn if symptoms persist or worsen. Call or return to clinic prn if these symptoms worsen or fail to improve as anticipated.

## 2013-04-04 NOTE — Patient Instructions (Signed)
Otitis Media With Effusion Otitis media with effusion is the presence of fluid in the middle ear. This is a common problem in children, which often follows ear infections. It may be present for weeks or longer after the infection. Unlike an acute ear infection, otitis media with effusion refers only to fluid behind the ear drum and not infection. Children with repeated ear and sinus infections and allergy problems are the most likely to get otitis media with effusion. CAUSES  The most frequent cause of the fluid buildup is dysfunction of the eustachian tubes. These are the tubes that drain fluid in the ears to the to the back of the nose (nasopharynx). SYMPTOMS   The main symptom of this condition is hearing loss. As a result, you or your child may:  Listen to the TV at a loud volume.  Not respond to questions.  Ask "what" often when spoken to.  Mistake or confuse on sound or word for another.  There may be a sensation of fullness or pressure but usually not pain. DIAGNOSIS   Your health care provider will diagnose this condition by examining you or your child's ears.  Your health care provider may test the pressure in you or your child's ear with a tympanometer.  A hearing test may be conducted if the problem persists. TREATMENT   Treatment depends on the duration and the effects of the effusion.  Antibiotics, decongestants, nose drops, and cortisone-type drugs (tablets or nasal spray) may not be helpful.  Children with persistent ear effusions may have delayed language or behavioral problems. Children at risk for developmental delays in hearing, learning, and speech may require referral to a specialist earlier than children not at risk.  You or your child's health care provider may suggest a referral to an ear, nose, and throat surgeon for treatment. The following may help restore normal hearing:  Drainage of fluid.  Placement of ear tubes (tympanostomy tubes).  Removal of  adenoids (adenoidectomy). HOME CARE INSTRUCTIONS   Avoid second hand smoke.  Infants who are breast fed are less likely to have this condition.  Avoid feeding infants while laying flat.  Avoid known environmental allergens.  Avoid people who are sick. SEEK MEDICAL CARE IF:   Hearing is not better in 3 months.  Hearing is worse.  Ear pain.  Drainage from the ear.  Dizziness. MAKE SURE YOU:   Understand these instructions.  Will watch your condition.  Will get help right away if you are not doing well or get worse. Document Released: 03/04/2004 Document Revised: 11/15/2012 Document Reviewed: 08/22/2012 ExitCare Patient Information 2014 [employer].  

## 2013-04-06 ENCOUNTER — Telehealth: Payer: Self-pay | Admitting: Family Medicine

## 2013-04-06 ENCOUNTER — Telehealth: Payer: Self-pay

## 2013-04-06 MED ORDER — DOXYCYCLINE HYCLATE 100 MG PO TABS: 100.0000 mg | ORAL_TABLET | Freq: Two times a day (BID) | ORAL | Status: DC

## 2013-04-06 NOTE — Telephone Encounter (Signed)
Yes rx for doxycyline sent to pharmacy.

## 2013-04-06 NOTE — Telephone Encounter (Signed)
Relevant patient education assigned to patient using Emmi. ° °

## 2013-04-06 NOTE — Telephone Encounter (Signed)
Pt seen 04/04/13; pt taking antibiotic but does not see any improvement; non prod cough is worse, ? Fever but pt having chills, S/T is very sore with white spots at back of throat, both ears burning. Head congested. [facility] [address] Pt request cb.

## 2013-04-06 NOTE — Telephone Encounter (Signed)
Spoke to pt and informed her Rx has been sent to pharmacy

## 2013-04-20 ENCOUNTER — Telehealth: Payer: Self-pay | Admitting: Family Medicine

## 2013-04-20 NOTE — Telephone Encounter (Signed)
Pt is calling concerning getting a wart removed. Pt has a question for Dr. Dayton MartesAron before she comes into office to have it removed.

## 2013-04-20 NOTE — Telephone Encounter (Signed)
Spoke to pt and advised of cryotherapy Tx. Appt scheduled for removal

## 2013-04-26 ENCOUNTER — Encounter: Payer: Self-pay | Admitting: Radiology

## 2013-04-27 ENCOUNTER — Encounter: Payer: Self-pay | Admitting: Family Medicine

## 2013-04-27 ENCOUNTER — Ambulatory Visit (INDEPENDENT_AMBULATORY_CARE_PROVIDER_SITE_OTHER): Payer: PRIVATE HEALTH INSURANCE | Admitting: Family Medicine

## 2013-04-27 VITALS — BP 128/66 | HR 84 | Temp 98.0°F | Wt 212.8 lb

## 2013-04-27 DIAGNOSIS — B079 Viral wart, unspecified: Secondary | ICD-10-CM | POA: Insufficient documentation

## 2013-04-27 NOTE — Patient Instructions (Signed)
Cryosurgery for Skin Conditions, Care After  Refer to this sheet in the next few weeks. These instructions provide you with information on caring for yourself after your procedure. Your health care Sara Roy may also give you more specific instructions. Your treatment has been planned according to current medical practices, but problems sometimes occur. Call your health care [name] if you have any problems or questions after your procedure.  WHAT TO EXPECT AFTER THE PROCEDURE  After your procedure, it is typical to have the following:   The treated area will become red and swollen shortly after the procedure.   Within 2 3 days, a blister will form over the treated area. The blister may contain a small amount of blood.   In about 2 weeks, the blister will break on its own, leaving a scab. The treated area will then heal. After healing, there is usually little or no scarring.  HOME CARE INSTRUCTIONS    Keep the treated area clean, dry, and covered with a bandage until healed. The area can be cleaned as usual with soap and water.   You may take showers. If your bandage gets wet, change it right away.   Do not pick at your blister or try to break it open. This can cause infection and scarring.   Do not apply any medicine, cream, or lotion to the treated area unless directed to do so by your health care [name].  SEEK MEDICAL CARE IF:    You have increased pain, swelling, redness, fluid drainage, or bleeding in the treated area.   Your blister becomes large and painful.  Document Released: 08/14/2004 Document Revised: 09/27/2012 Document Reviewed: 08/25/2012  ExitCare Patient Information 2014 [employer].

## 2013-04-27 NOTE — Progress Notes (Signed)
Pre visit review using our clinic review tool, if applicable. No additional management support is needed unless otherwise documented below in the visit note. 

## 2013-04-27 NOTE — Progress Notes (Signed)
S: The patient complains of wart inside left nostril present for 2 month(s).     Patient Active Problem List   Diagnosis Date Noted  . Wart 04/27/2013  . 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 Fatigue 08/18/2011  . Leukocytosis 08/18/2011  . Elevated hemoglobin 08/18/2011  . Anxiety   . Hypertension    Past Medical History  Diagnosis Date  . Anxiety   . Hypertension   . Family history of early CAD   . Tobacco abuse    Past Surgical History  Procedure Laterality Date  . Cholecystectomy    . Abdominal hysterectomy     History  Substance Use Topics  . Smoking status: Current Every Day Smoker  . Smokeless tobacco: Not on file  . Alcohol Use: Not on file   Family History  Problem Relation Age of Onset  . Heart disease Father    Allergies  Allergen Reactions  . Lisinopril     Rash   . Losartan   . Penicillins Hives   Current Outpatient Prescriptions on File Prior to Visit  Medication Sig Dispense Refill  . ALPRAZolam (XANAX) 0.25 MG tablet TAKE ONE TABLET BY MOUTH THREE TIMES DAILY AS NEEDED FOR SLEEP/ANXIETY  90 tablet  0  . amLODipine (NORVASC) 5 MG tablet Take 1 tablet (5 mg total) by mouth daily.  90 tablet  1  . aspirin 325 MG tablet Take 325 mg by mouth daily.      . fluocinonide-emollient (LIDEX-E) 0.05 % cream Apply topically 2 (two) times daily.  30 g  0  . hydrochlorothiazide (HYDRODIURIL) 25 MG tablet Take 1 tablet (25 mg total) by mouth daily.  30 tablet  1  . potassium chloride (K-DUR,KLOR-CON) 10 MEQ tablet Take one tablet by mouth once daily. **WILL NEED FOLLOW UP FOR FURTHER REFILLS**  30 tablet  2   No current facility-administered medications on file prior to visit.   The PMH, PSH, Social History, Family History, Medications, and allergies [facility], and have been updated if relevant.  O:  BP 128/66  Pulse 84  Temp(Src) 98 F (36.7 C) (Oral)  Wt 212 lb 12 oz (96.503 kg)  SpO2 95%  Exam discloses typical warts  on inside aspect of left nostril.    A: Viral warts  P: The treatments, side effects and failure rates are discussed.  Liquid nitrogen was applied to the wart.  The expected skin reaction including erythema, pain, scabbing, blistering and hypopigmented scar formation was discussed.  See at intervals until warts resolved.

## 2013-04-30 ENCOUNTER — Telehealth: Payer: Self-pay | Admitting: Family Medicine

## 2013-04-30 NOTE — Telephone Encounter (Signed)
Relevant patient education assigned to patient using Emmi. ° °

## 2013-05-01 ENCOUNTER — Telehealth: Payer: Self-pay

## 2013-05-01 DIAGNOSIS — B079 Viral wart, unspecified: Secondary | ICD-10-CM

## 2013-05-01 NOTE — Telephone Encounter (Signed)
Pt only wants to speak with Dr Dayton MartesAron; advised Dr Dayton MartesAron was not in the office today and could I take a message. Pt said no she will only talk with Dr Dayton MartesAron.pt does not want to speak with another physician. Pt said at Dr Elmer SowAron's convenience to call pt.

## 2013-05-02 NOTE — Telephone Encounter (Signed)
Returned pt's call. Wart is same size.  I will refer dermatologist- referral placed.

## 2013-05-14 ENCOUNTER — Other Ambulatory Visit: Payer: Self-pay | Admitting: Family Medicine

## 2013-05-14 IMAGING — CT CT HEAD WITHOUT CONTRAST
2 series · 16 of 30 positions shown, 20 images · non-contrast
Comparison: none

REASON FOR EXAM: "Worst headache of life", elevated BP
COMMENTS:

PROCEDURE:     CT  - CT HEAD WITHOUT CONTRAST  - July 22, 2011  [DATE]
RESULT:     Comparison:  None
TECHNIQUE: Multiple axial images from the foramen magnum to the vertex were
obtained without IV contrast.

[Series 2: without · axial · non-contrast · 0.43mm/px · z∈[-134,+1]mm · 13 of 33 slices shown, 17 images]
[im 3/33  brain]
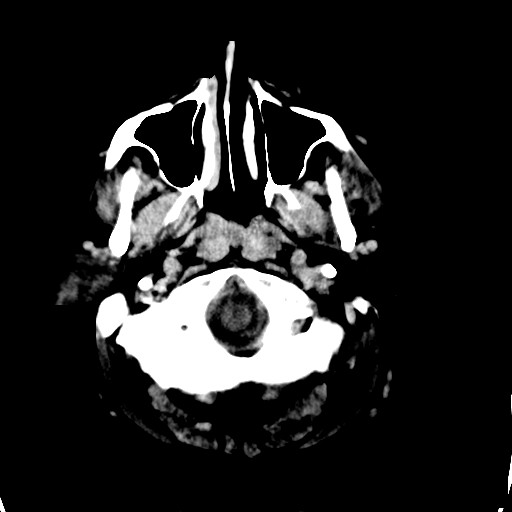
[im 3/33  bone]
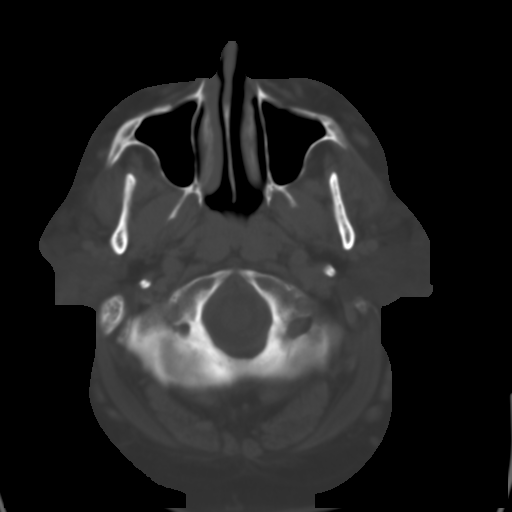
[im 5/33  brain]
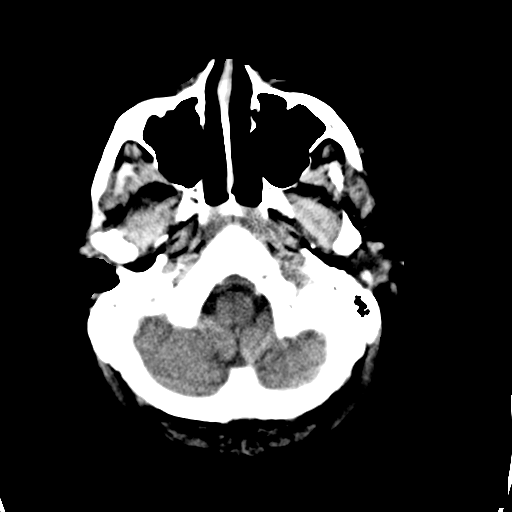
[im 7/33  brain]
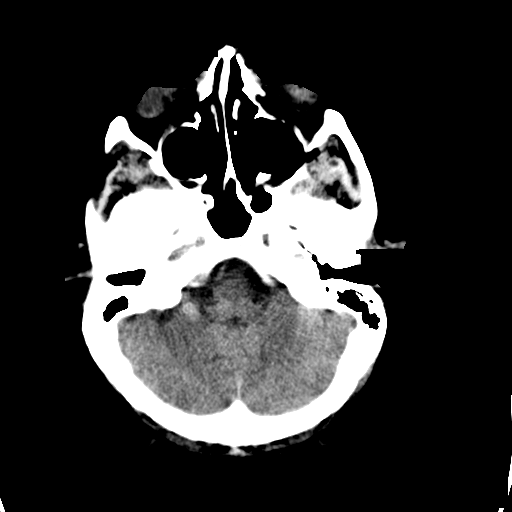
[im 10/33  brain]
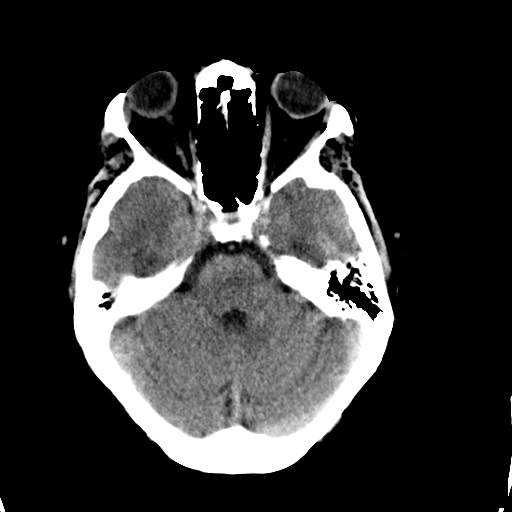
[im 12/33  brain]
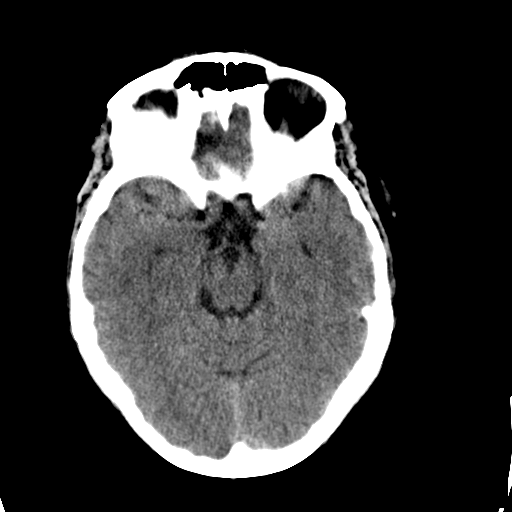
[im 12/33  bone]
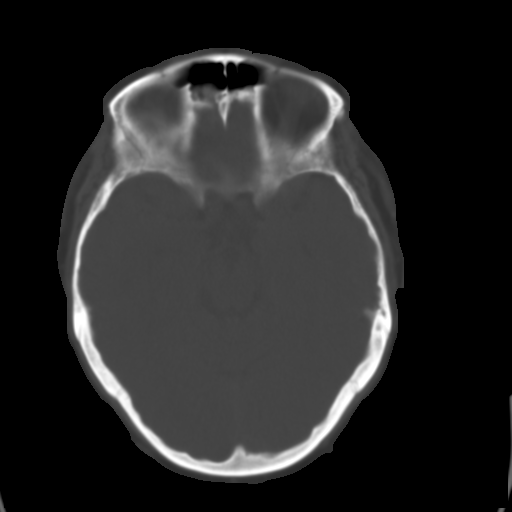
[im 14/33  brain]
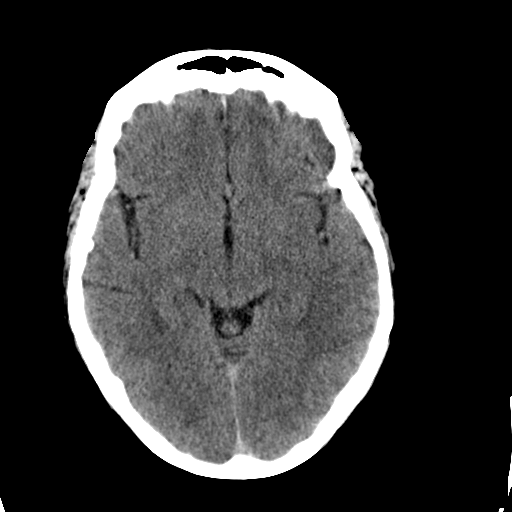
[im 17/33  brain]
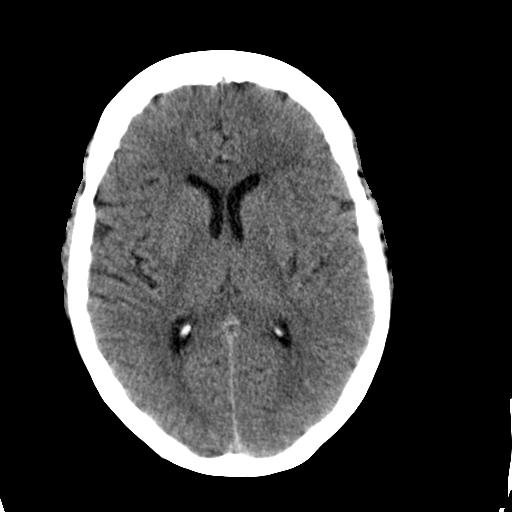
[im 19/33  brain]
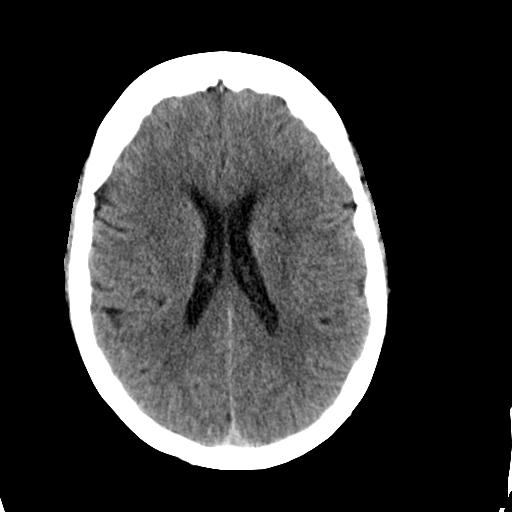
[im 21/33  brain]
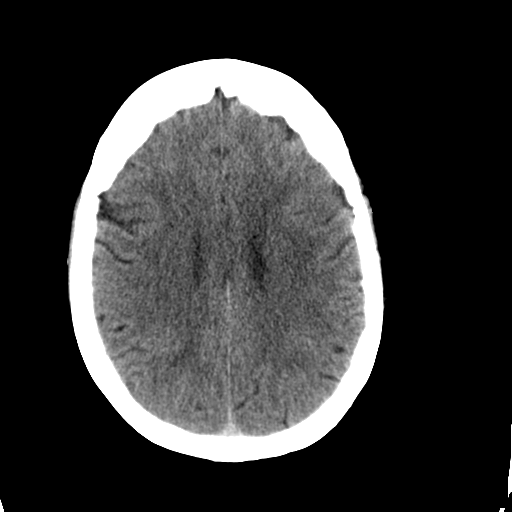
[im 21/33  bone]
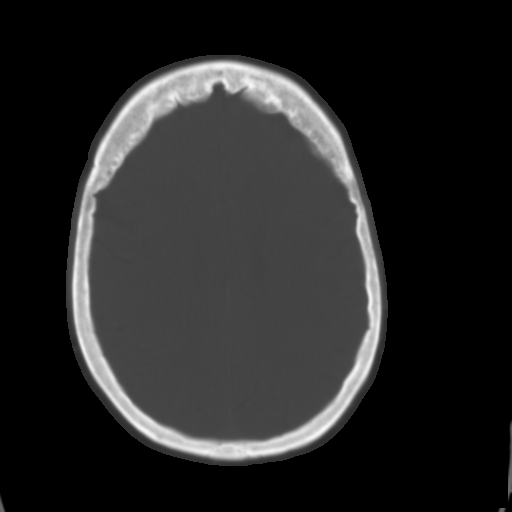
[im 23/33  brain]
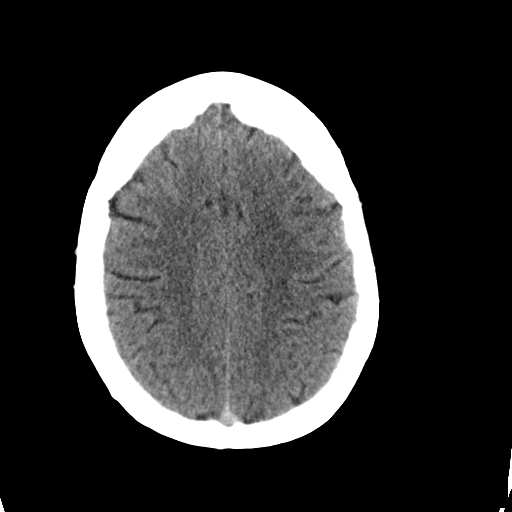
[im 26/33  brain]
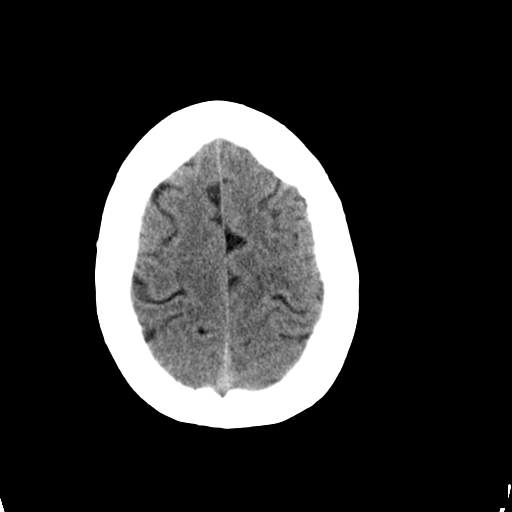
[im 28/33  brain]
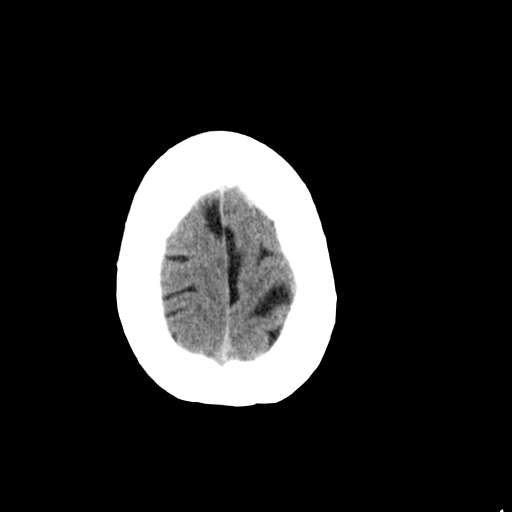
[im 30/33  brain]
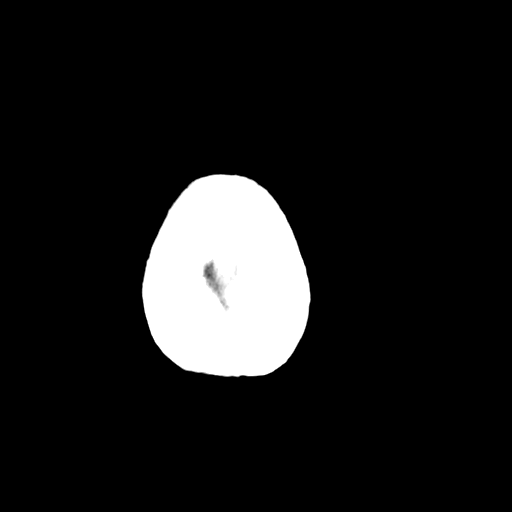
[im 30/33  bone]
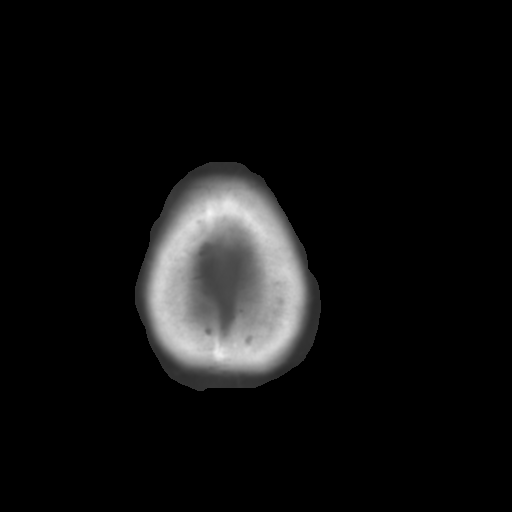

[Series 3: bone · axial · 0.43mm/px · z∈[-134,-89]mm · 3 of 33 slices shown]
[im 3/33  bone]
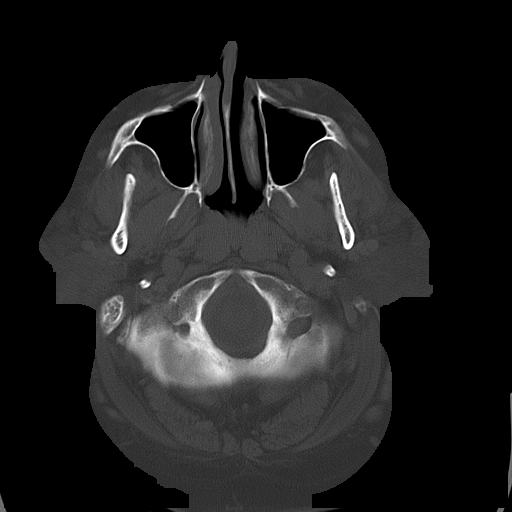
[im 7/33  bone]
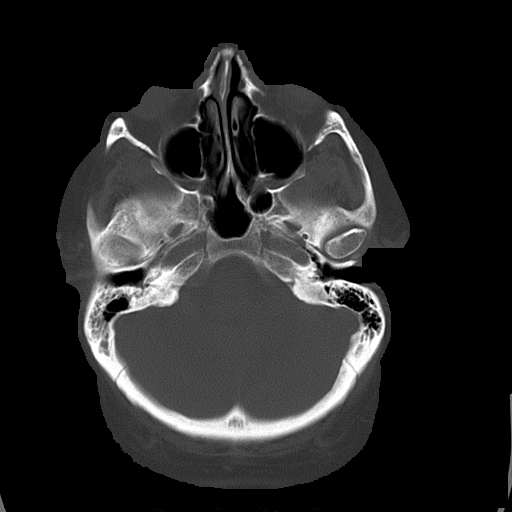
[im 12/33  bone]
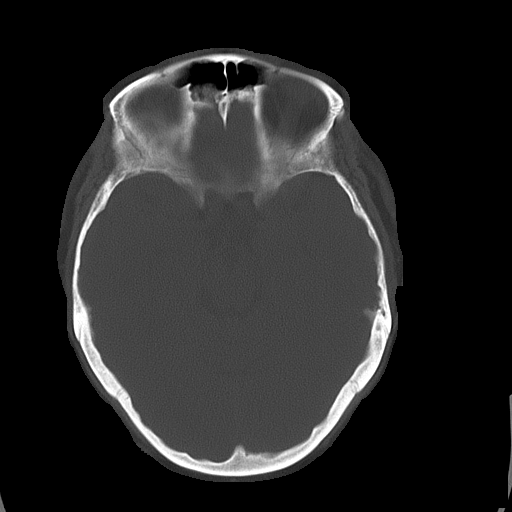

[16 of 30 positions shown; findings below may reference images not displayed]

FINDINGS: There is no evidence of mass effect, midline shift, or extra-axial fluid
collections.  There is no evidence of a space-occupying lesion or
intracranial hemorrhage. There is no evidence of a cortical-based area of
acute infarction.

The ventricles and sulci are appropriate for the patient's age. The basal
cisterns are patent.

Visualized portions of the orbits are unremarkable. The visualized portions
of the paranasal sinuses and mastoid air cells are unremarkable.

The osseous structures are unremarkable.
IMPRESSION: No acute intracranial process.

[REDACTED]

## 2013-05-28 ENCOUNTER — Other Ambulatory Visit: Payer: Self-pay | Admitting: Family Medicine

## 2013-05-28 NOTE — Telephone Encounter (Signed)
Pt requesting medication refill. Last ov 04/2013-acute. pls advise

## 2013-05-28 NOTE — Telephone Encounter (Signed)
Spoke to pt and informed her Rx has been faxed to requested pharmacy 

## 2013-07-20 ENCOUNTER — Other Ambulatory Visit: Payer: Self-pay

## 2013-07-20 MED ORDER — AMLODIPINE BESYLATE 5 MG PO TABS: 5.0000 mg | ORAL_TABLET | Freq: Every day | ORAL | Status: DC

## 2013-08-11 ENCOUNTER — Other Ambulatory Visit: Payer: Self-pay | Admitting: Family Medicine

## 2013-08-20 ENCOUNTER — Other Ambulatory Visit: Payer: Self-pay | Admitting: Family Medicine

## 2013-08-21 NOTE — Telephone Encounter (Signed)
Lm on pts vm and informed her Rx has been faxed to requested pharmacy 

## 2013-08-21 NOTE — Telephone Encounter (Signed)
Pt requesting medication refill. No f/u appts noted, only acute since est appt in 2013. pls advise

## 2013-08-23 MED ORDER — ALPRAZOLAM 0.25 MG PO TABS: ORAL_TABLET | ORAL | Status: DC

## 2013-08-23 NOTE — Telephone Encounter (Signed)
Pt called to ck on alprazolam refill; [facility] told pt did not have refill approval. Spoke with Delice Bisonara at [facility] [address] and pharmacy did not receive faxed rx on 08/21/13. [facility] [address] [facility] as instructed.notified pt med refilled.pt voiced understanding.

## 2013-08-23 NOTE — Addendum Note (Signed)
Addended by: Patience MuscaISLEY, RENA M on: 08/23/2013 04:37 PM   Modules accepted: Orders

## 2013-09-10 ENCOUNTER — Telehealth: Payer: Self-pay | Admitting: Family Medicine

## 2013-09-10 DIAGNOSIS — M79606 Pain in leg, unspecified: Secondary | ICD-10-CM

## 2013-09-10 NOTE — Telephone Encounter (Signed)
Pt request call back by Dr. Dayton MartesAron. Pt would not give any other information.

## 2013-09-10 NOTE — Telephone Encounter (Signed)
Returned pt's call.   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 On 6/3- was told all cancer removed and is cancer free.  She is also complaining of leg pain with walking.  Will order ABIs.

## 2013-09-11 ENCOUNTER — Other Ambulatory Visit: Payer: Self-pay | Admitting: Family Medicine

## 2013-09-11 ENCOUNTER — Other Ambulatory Visit (HOSPITAL_COMMUNITY): Payer: Self-pay | Admitting: *Deleted

## 2013-09-11 DIAGNOSIS — M79606 Pain in leg, unspecified: Secondary | ICD-10-CM

## 2013-09-11 DIAGNOSIS — I70219 Atherosclerosis of native arteries of extremities with intermittent claudication, unspecified extremity: Secondary | ICD-10-CM

## 2013-09-13 ENCOUNTER — Telehealth: Payer: Self-pay | Admitting: *Deleted

## 2013-09-13 ENCOUNTER — Encounter (INDEPENDENT_AMBULATORY_CARE_PROVIDER_SITE_OTHER): Payer: No Typology Code available for payment source

## 2013-09-13 DIAGNOSIS — I70219 Atherosclerosis of native arteries of extremities with intermittent claudication, unspecified extremity: Secondary | ICD-10-CM

## 2013-09-13 DIAGNOSIS — M79606 Pain in leg, unspecified: Secondary | ICD-10-CM

## 2013-09-13 NOTE — Telephone Encounter (Signed)
Sara Roy from [facility] called and stated that the pts arterial doppler was "quite abnormal." to avoid delays, they scheduled a PV consult with Dr Kirke CorinArida on 09/14/13. Report is not in Epic as of yet, but should be shortly

## 2013-09-14 ENCOUNTER — Encounter: Payer: Self-pay | Admitting: Cardiovascular Disease

## 2013-09-14 ENCOUNTER — Ambulatory Visit (INDEPENDENT_AMBULATORY_CARE_PROVIDER_SITE_OTHER): Payer: No Typology Code available for payment source | Admitting: Cardiovascular Disease

## 2013-09-14 VITALS — BP 162/84 | HR 82 | Ht 66.0 in | Wt 213.5 lb

## 2013-09-14 DIAGNOSIS — E785 Hyperlipidemia, unspecified: Secondary | ICD-10-CM | POA: Insufficient documentation

## 2013-09-14 DIAGNOSIS — R0602 Shortness of breath: Secondary | ICD-10-CM | POA: Insufficient documentation

## 2013-09-14 DIAGNOSIS — I10 Essential (primary) hypertension: Secondary | ICD-10-CM

## 2013-09-14 DIAGNOSIS — E1169 Type 2 diabetes mellitus with other specified complication: Secondary | ICD-10-CM | POA: Insufficient documentation

## 2013-09-14 DIAGNOSIS — R0609 Other forms of dyspnea: Secondary | ICD-10-CM

## 2013-09-14 DIAGNOSIS — I739 Peripheral vascular disease, unspecified: Secondary | ICD-10-CM | POA: Insufficient documentation

## 2013-09-14 DIAGNOSIS — Z720 Tobacco use: Secondary | ICD-10-CM | POA: Insufficient documentation

## 2013-09-14 DIAGNOSIS — F17200 Nicotine dependence, unspecified, uncomplicated: Secondary | ICD-10-CM

## 2013-09-14 DIAGNOSIS — R0989 Other specified symptoms and signs involving the circulatory and respiratory systems: Secondary | ICD-10-CM

## 2013-09-14 MED ORDER — ATORVASTATIN CALCIUM 40 MG PO TABS
40.0000 mg | ORAL_TABLET | Freq: Every day | ORAL | Status: DC
Start: 2013-09-14 — End: 2014-09-05

## 2013-09-14 NOTE — Progress Notes (Signed)
Primary care physician: Dr. Dayton Martes  HPI  This is a 53 year old female who was referred for evaluation and management of recently diagnosed peripheral arterial disease. She has no previous cardiac history. She has chronic medical conditions that include hypertension, hyperlipidemia and prolonged tobacco use. She smokes one half pack per day and has been doing so for more than 40 years. She reports progressive symptoms of bilateral leg discomfort starting from the back or the way down worse on the left side and currently happening after walking less than 50 feet. This started about one year ago and has been progressive since then. The symptoms are associated with severe cramping in the muscles. There is no rest pain or lower extremity ulceration. She had recent noninvasive evaluation which showed moderately to severely reduced ABI bilaterally with evidence of severe inflow disease. She denies any chest discomfort. However, she does complain of dyspnea with minimal activities. No previous cardiac evaluation.  Allergies  Allergen Reactions  . Lisinopril     Rash   . Losartan   . Penicillins Hives     Current Outpatient Prescriptions on File Prior to Visit  Medication Sig Dispense Refill  . ALPRAZolam (XANAX) 0.25 MG tablet TAKE 1 TABLET BY MOUTH THREE TIMES DAILY AS NEEDED FOR SLEEP AND OR ANXIETY  90 tablet  0  . amLODipine (NORVASC) 5 MG tablet Take 1 tablet (5 mg total) by mouth daily.  90 tablet  1  . aspirin 325 MG tablet Take 325 mg by mouth daily.      . hydrochlorothiazide (HYDRODIURIL) 25 MG tablet TAKE 1 TABLET BY MOUTH DAILY.  30 tablet  2   No current facility-administered medications on file prior to visit.     Past Medical History  Diagnosis Date  . Anxiety   . Family history of early CAD   . Tobacco abuse   . Hypertension   . Hyperlipidemia      Past Surgical History  Procedure Laterality Date  . Cholecystectomy    . Abdominal hysterectomy    . Skin cancer  excision       Family History  Problem Relation Age of Onset  . Heart disease Father      History   Social History  . Marital Status: Married    Spouse Name: N/A    Number of Children: N/A  . Years of Education: N/A   Occupational History  . Not on file.   Social History Main Topics  . Smoking status: Current Every Day Smoker -- 40 years    Types: Cigarettes  . Smokeless tobacco: Not on file  . Alcohol Use: No  . Drug Use: Yes    Special: Marijuana  . Sexual Activity: Not on file   Other Topics Concern  . Not on file   Social History Narrative  . No narrative on file     ROS A 10 point review of system was performed. It is negative other than that mentioned in the history of present illness.   PHYSICAL EXAM   BP 162/84  Pulse 82  Ht  (1.676 m)  Wt 213 lb 8 oz (96.843 kg)  BMI 34.48 kg/m2 Constitutional: She is oriented to person, place, and time. She appears well-developed and well-nourished. No distress.  HENT: No nasal discharge.  Head: Normocephalic and atraumatic.  Eyes: Pupils are equal and round. No discharge.  Neck: Normal range of motion. Neck supple. No JVD present. No thyromegaly present.  Cardiovascular: Normal rate, regular rhythm, normal  heart sounds. Exam reveals no gallop and no friction rub. No murmur heard.  Pulmonary/Chest: Effort normal and breath sounds normal. No stridor. No respiratory distress. She has no wheezes. She has no rales. She exhibits no tenderness.  Abdominal: Soft. Bowel sounds are normal. She exhibits no distension. There is no tenderness. There is no rebound and no guarding.  Musculoskeletal: Normal range of motion. She exhibits no edema and no tenderness.  Neurological: She is alert and oriented to person, place, and time. Coordination normal.  Skin: Skin is warm and dry. No rash noted. She is not diaphoretic. No erythema. No pallor.  Psychiatric: She has a normal mood and affect. Her behavior is normal. Judgment  and thought content normal.  Vascular: Femoral pulses are absent bilaterally. Radial pulses are normal. Distal pulses are not palpable.   ZOX:WRUEAEKG:Sinus  Rhythm  -Prominent R(V1) and right axis -consider right ventricular hypertrophy  -consider pulmonary disease.   ABNORMAL     ASSESSMENT AND PLAN

## 2013-09-14 NOTE — Patient Instructions (Signed)
  Non-Cardiac CT Angiography (CTA), is a special type of CT scan that uses a computer to produce multi-dimensional views of major blood vessels throughout the body. In CT angiography, a contrast material is injected through an IV to help visualize the blood vessels. This will be on Wednesday 09/19/13 at 0915 am. [facility] [phone] [facility]). Bring a list of your medications with you.    Your physician has recommended you make the following change in your medication:  Start Atorvastatin 40 mg once daily   Your physician recommends that you return for lab work in:  1 month for fasting lipid and liver   Your physician recommends that you schedule a follow-up appointment in:  1 month with Dr. Kirke CorinArida

## 2013-09-14 NOTE — Assessment & Plan Note (Signed)
Blood pressure is mildly elevated

## 2013-09-14 NOTE — Assessment & Plan Note (Signed)
The patient has severe bilateral leg claudication worse on the left side due to what seems to be in aortoiliac disease. Femoral pulses are absent bilaterally and thus, there is possibility of in aortoiliac occlusion. Thus, I requested CT angiogram of the abdominal aorta with lower extremity runoff. If there is long occlusion, she might require surgical revascularization. Otherwise, endovascular intervention might be attempted. Continue treatment with aspirin.

## 2013-09-14 NOTE — Assessment & Plan Note (Signed)
This could be due to prolonged tobacco use. However, she has multiple risk factors for coronary artery disease and has an abnormal EKG. She will require evaluation with an echo and a stress test in the near future.

## 2013-09-14 NOTE — Assessment & Plan Note (Signed)
Lab Results  Component Value Date   CHOL 233* 08/18/2011   HDL 39.80 08/18/2011   LDLDIRECT 162.7 08/18/2011   TRIG 161.0* 08/18/2011   CHOLHDL 6 08/18/2011   Previous lipid profile showed significantly elevated LDL. Given the presence of peripheral arterial disease, I recommend a target LDL of less than 70. I started treatment with atorvastatin.

## 2013-09-14 NOTE — Assessment & Plan Note (Signed)
I had a prolonged discussion with her about the importance of smoking cessation. 

## 2013-09-19 ENCOUNTER — Ambulatory Visit: Payer: Self-pay | Admitting: Cardiovascular Disease

## 2013-09-19 ENCOUNTER — Other Ambulatory Visit: Payer: Self-pay

## 2013-09-19 DIAGNOSIS — I739 Peripheral vascular disease, unspecified: Secondary | ICD-10-CM

## 2013-09-28 NOTE — Telephone Encounter (Signed)
This encounter was created in error - please disregard.

## 2013-10-01 ENCOUNTER — Encounter: Payer: Self-pay | Admitting: *Deleted

## 2013-10-01 ENCOUNTER — Telehealth: Payer: Self-pay | Admitting: *Deleted

## 2013-10-01 DIAGNOSIS — Z01812 Encounter for preprocedural laboratory examination: Secondary | ICD-10-CM

## 2013-10-01 DIAGNOSIS — I739 Peripheral vascular disease, unspecified: Secondary | ICD-10-CM

## 2013-10-01 NOTE — Telephone Encounter (Signed)
Patient scheduled for procedure and labs  Patient is aware

## 2013-10-01 NOTE — Telephone Encounter (Signed)
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 Recommend abdominal aortogram, LE run off and possible angioplasty to be done at [facility]. ------

## 2013-10-02 ENCOUNTER — Ambulatory Visit (INDEPENDENT_AMBULATORY_CARE_PROVIDER_SITE_OTHER): Payer: No Typology Code available for payment source | Admitting: *Deleted

## 2013-10-02 DIAGNOSIS — I739 Peripheral vascular disease, unspecified: Secondary | ICD-10-CM

## 2013-10-02 DIAGNOSIS — Z01812 Encounter for preprocedural laboratory examination: Secondary | ICD-10-CM

## 2013-10-03 LAB — CBC WITH DIFFERENTIAL
Basophils Absolute: 0 10*3/uL (ref 0.0–0.2)
Basos: 0 %
EOS: 2 %
Eosinophils Absolute: 0.3 10*3/uL (ref 0.0–0.4)
HEMATOCRIT: 40.1 % (ref 34.0–46.6)
Hemoglobin: 13 g/dL (ref 11.1–15.9)
Immature Grans (Abs): 0 10*3/uL (ref 0.0–0.1)
Immature Granulocytes: 0 %
LYMPHS ABS: 3.4 10*3/uL — AB (ref 0.7–3.1)
Lymphs: 28 %
MCH: 26.6 pg (ref 26.6–33.0)
MCHC: 32.4 g/dL (ref 31.5–35.7)
MCV: 82 fL (ref 79–97)
MONOCYTES: 7 %
MONOS ABS: 0.8 10*3/uL (ref 0.1–0.9)
NEUTROS ABS: 7.5 10*3/uL — AB (ref 1.4–7.0)
Neutrophils Relative %: 63 %
PLATELETS: 374 10*3/uL (ref 150–379)
RBC: 4.88 x10E6/uL (ref 3.77–5.28)
RDW: 15.3 % (ref 12.3–15.4)
WBC: 12.1 10*3/uL — AB (ref 3.4–10.8)

## 2013-10-03 LAB — BASIC METABOLIC PANEL
BUN / CREAT RATIO: 19 (ref 9–23)
BUN: 15 mg/dL (ref 6–24)
CO2: 24 mmol/L (ref 18–29)
CREATININE: 0.77 mg/dL (ref 0.57–1.00)
Calcium: 9.2 mg/dL (ref 8.7–10.2)
Chloride: 96 mmol/L — ABNORMAL LOW (ref 97–108)
GFR calc non Af Amer: 87 mL/min/{1.73_m2} (ref >59)
GFR, EST AFRICAN AMERICAN: 101 mL/min/{1.73_m2} (ref >59)
Glucose: 89 mg/dL (ref 65–99)
Potassium: 4.4 mmol/L (ref 3.5–5.2)
Sodium: 140 mmol/L (ref 134–144)

## 2013-10-03 LAB — PROTIME-INR
INR: 0.9 (ref 0.8–1.2)
PROTHROMBIN TIME: 9.9 s (ref 9.1–12.0)

## 2013-10-04 ENCOUNTER — Encounter (HOSPITAL_COMMUNITY): Payer: Self-pay | Admitting: Pharmacy Technician

## 2013-10-08 ENCOUNTER — Other Ambulatory Visit: Payer: Self-pay | Admitting: Family Medicine

## 2013-10-09 NOTE — Telephone Encounter (Signed)
Pt recently had normal labs. Does she need to continue K+?

## 2013-10-10 ENCOUNTER — Encounter (HOSPITAL_COMMUNITY)
Admission: RE | Disposition: A | Discharge status: Home / Self Care | Payer: Self-pay | Source: Ambulatory Visit | Attending: Cardiovascular Disease

## 2013-10-10 ENCOUNTER — Other Ambulatory Visit: Payer: Self-pay | Admitting: Cardiovascular Disease

## 2013-10-10 ENCOUNTER — Ambulatory Visit (HOSPITAL_COMMUNITY)
Admission: RE | Admit: 2013-10-10 | Discharge: 2013-10-11 | Disposition: A | Discharge status: Home / Self Care | Payer: No Typology Code available for payment source | Source: Ambulatory Visit | Attending: Cardiovascular Disease | Admitting: Cardiovascular Disease

## 2013-10-10 DIAGNOSIS — Z7982 Long term (current) use of aspirin: Secondary | ICD-10-CM | POA: Insufficient documentation

## 2013-10-10 DIAGNOSIS — F17200 Nicotine dependence, unspecified, uncomplicated: Secondary | ICD-10-CM | POA: Diagnosis not present

## 2013-10-10 DIAGNOSIS — Z6834 Body mass index (BMI) 34.0-34.9, adult: Secondary | ICD-10-CM | POA: Diagnosis not present

## 2013-10-10 DIAGNOSIS — I10 Essential (primary) hypertension: Secondary | ICD-10-CM | POA: Insufficient documentation

## 2013-10-10 DIAGNOSIS — I70219 Atherosclerosis of native arteries of extremities with intermittent claudication, unspecified extremity: Secondary | ICD-10-CM | POA: Insufficient documentation

## 2013-10-10 DIAGNOSIS — Z716 Tobacco abuse counseling: Secondary | ICD-10-CM

## 2013-10-10 DIAGNOSIS — E669 Obesity, unspecified: Secondary | ICD-10-CM | POA: Insufficient documentation

## 2013-10-10 DIAGNOSIS — F411 Generalized anxiety disorder: Secondary | ICD-10-CM | POA: Insufficient documentation

## 2013-10-10 DIAGNOSIS — E785 Hyperlipidemia, unspecified: Secondary | ICD-10-CM | POA: Diagnosis not present

## 2013-10-10 DIAGNOSIS — I739 Peripheral vascular disease, unspecified: Secondary | ICD-10-CM | POA: Diagnosis present

## 2013-10-10 HISTORY — PX: ABDOMINAL AORTAGRAM: SHX5454

## 2013-10-10 HISTORY — DX: Peripheral vascular disease, unspecified: I73.9

## 2013-10-10 HISTORY — PX: LOWER EXTREMITY ANGIOGRAM: SHX5955

## 2013-10-10 LAB — POCT ACTIVATED CLOTTING TIME
ACTIVATED CLOTTING TIME: 168 s
ACTIVATED CLOTTING TIME: 236 s

## 2013-10-10 SURGERY — ABDOMINAL AORTAGRAM
Anesthesia: LOCAL

## 2013-10-10 MED ORDER — ACETAMINOPHEN 325 MG PO TABS: 650.0000 mg | ORAL_TABLET | ORAL | Status: DC | PRN

## 2013-10-10 MED ORDER — ALPRAZOLAM 0.25 MG PO TABS
1.0000 mg | ORAL_TABLET | Freq: Two times a day (BID) | ORAL | Status: DC | PRN
  Administered 2013-10-10: 1 mg via ORAL

## 2013-10-10 MED ORDER — FENTANYL CITRATE 0.05 MG/ML IJ SOLN
INTRAMUSCULAR | Status: AC
  Filled 2013-10-10: qty 2

## 2013-10-10 MED ORDER — MIDAZOLAM HCL 2 MG/2ML IJ SOLN
INTRAMUSCULAR | Status: AC
  Filled 2013-10-10: qty 2

## 2013-10-10 MED ORDER — SODIUM CHLORIDE 0.9 % IJ SOLN: 3.0000 mL | INTRAMUSCULAR | Status: DC | PRN

## 2013-10-10 MED ORDER — LIDOCAINE HCL (PF) 1 % IJ SOLN
INTRAMUSCULAR | Status: AC
  Filled 2013-10-10: qty 30

## 2013-10-10 MED ORDER — SODIUM CHLORIDE 0.9 % IV SOLN: 250.0000 mL | INTRAVENOUS | Status: DC | PRN

## 2013-10-10 MED ORDER — SODIUM CHLORIDE 0.9 % IV SOLN
INTRAVENOUS | Status: DC
  Administered 2013-10-10: 09:00:00 via INTRAVENOUS

## 2013-10-10 MED ORDER — HEPARIN SODIUM (PORCINE) 1000 UNIT/ML IJ SOLN
INTRAMUSCULAR | Status: AC
  Filled 2013-10-10: qty 1

## 2013-10-10 MED ORDER — ATORVASTATIN CALCIUM 40 MG PO TABS: 40.0000 mg | ORAL_TABLET | Freq: Every day | ORAL | Status: DC

## 2013-10-10 MED ORDER — CLOPIDOGREL BISULFATE 300 MG PO TABS
ORAL_TABLET | ORAL | Status: AC
  Filled 2013-10-10: qty 2

## 2013-10-10 MED ORDER — HEPARIN (PORCINE) IN NACL 2-0.9 UNIT/ML-% IJ SOLN
INTRAMUSCULAR | Status: AC
  Filled 2013-10-10: qty 1000

## 2013-10-10 MED ORDER — CLOPIDOGREL BISULFATE 75 MG PO TABS: 75.0000 mg | ORAL_TABLET | Freq: Every day | ORAL | Status: DC

## 2013-10-10 MED ORDER — SODIUM CHLORIDE 0.9 % IV SOLN: INTRAVENOUS | Status: AC

## 2013-10-10 MED ORDER — SODIUM CHLORIDE 0.9 % IJ SOLN: 3.0000 mL | Freq: Two times a day (BID) | INTRAMUSCULAR | Status: DC

## 2013-10-10 MED ORDER — AMLODIPINE BESYLATE 5 MG PO TABS: 5.0000 mg | ORAL_TABLET | Freq: Every day | ORAL | Status: DC

## 2013-10-10 MED ORDER — ASPIRIN 81 MG PO CHEW: 81.0000 mg | CHEWABLE_TABLET | Freq: Every day | ORAL | Status: DC

## 2013-10-10 MED ORDER — ASPIRIN 81 MG PO CHEW: 81.0000 mg | CHEWABLE_TABLET | ORAL | Status: DC

## 2013-10-10 MED ORDER — ALPRAZOLAM 0.25 MG PO TABS
ORAL_TABLET | ORAL | Status: AC
  Filled 2013-10-10: qty 4

## 2013-10-10 NOTE — H&P (View-Only) (Signed)
Primary care physician: Dr. Dayton Martes  HPI  This is a 53 year old female who was referred for evaluation and management of recently diagnosed peripheral arterial disease. She has no previous cardiac history. She has chronic medical conditions that include hypertension, hyperlipidemia and prolonged tobacco use. She smokes one half pack per day and has been doing so for more than 40 years. She reports progressive symptoms of bilateral leg discomfort starting from the back or the way down worse on the left side and currently happening after walking less than 50 feet. This started about one year ago and has been progressive since then. The symptoms are associated with severe cramping in the muscles. There is no rest pain or lower extremity ulceration. She had recent noninvasive evaluation which showed moderately to severely reduced ABI bilaterally with evidence of severe inflow disease. She denies any chest discomfort. However, she does complain of dyspnea with minimal activities. No previous cardiac evaluation.  Allergies  Allergen Reactions  . Lisinopril     Rash   . Losartan   . Penicillins Hives     Current Outpatient Prescriptions on File Prior to Visit  Medication Sig Dispense Refill  . ALPRAZolam (XANAX) 0.25 MG tablet TAKE 1 TABLET BY MOUTH THREE TIMES DAILY AS NEEDED FOR SLEEP AND OR ANXIETY  90 tablet  0  . amLODipine (NORVASC) 5 MG tablet Take 1 tablet (5 mg total) by mouth daily.  90 tablet  1  . aspirin 325 MG tablet Take 325 mg by mouth daily.      . hydrochlorothiazide (HYDRODIURIL) 25 MG tablet TAKE 1 TABLET BY MOUTH DAILY.  30 tablet  2   No current facility-administered medications on file prior to visit.     Past Medical History  Diagnosis Date  . Anxiety   . Family history of early CAD   . Tobacco abuse   . Hypertension   . Hyperlipidemia      Past Surgical History  Procedure Laterality Date  . Cholecystectomy    . Abdominal hysterectomy    . Skin cancer  excision       Family History  Problem Relation Age of Onset  . Heart disease Father      History   Social History  . Marital Status: Married    Spouse Name: N/A    Number of Children: N/A  . Years of Education: N/A   Occupational History  . Not on file.   Social History Main Topics  . Smoking status: Current Every Day Smoker -- 40 years    Types: Cigarettes  . Smokeless tobacco: Not on file  . Alcohol Use: No  . Drug Use: Yes    Special: Marijuana  . Sexual Activity: Not on file   Other Topics Concern  . Not on file   Social History Narrative  . No narrative on file     ROS A 10 point review of system was performed. It is negative other than that mentioned in the history of present illness.   PHYSICAL EXAM   BP 162/84  Pulse 82  Ht  (1.676 m)  Wt 213 lb 8 oz (96.843 kg)  BMI 34.48 kg/m2 Constitutional: She is oriented to person, place, and time. She appears well-developed and well-nourished. No distress.  HENT: No nasal discharge.  Head: Normocephalic and atraumatic.  Eyes: Pupils are equal and round. No discharge.  Neck: Normal range of motion. Neck supple. No JVD present. No thyromegaly present.  Cardiovascular: Normal rate, regular rhythm, normal  heart sounds. Exam reveals no gallop and no friction rub. No murmur heard.  Pulmonary/Chest: Effort normal and breath sounds normal. No stridor. No respiratory distress. She has no wheezes. She has no rales. She exhibits no tenderness.  Abdominal: Soft. Bowel sounds are normal. She exhibits no distension. There is no tenderness. There is no rebound and no guarding.  Musculoskeletal: Normal range of motion. She exhibits no edema and no tenderness.  Neurological: She is alert and oriented to person, place, and time. Coordination normal.  Skin: Skin is warm and dry. No rash noted. She is not diaphoretic. No erythema. No pallor.  Psychiatric: She has a normal mood and affect. Her behavior is normal. Judgment  and thought content normal.  Vascular: Femoral pulses are absent bilaterally. Radial pulses are normal. Distal pulses are not palpable.   ION:GEXBM  Rhythm  -Prominent R(V1) and right axis -consider right ventricular hypertrophy  -consider pulmonary disease.   ABNORMAL     ASSESSMENT AND PLAN

## 2013-10-10 NOTE — Progress Notes (Signed)
Pressure held left groin for 15 min ending now per Dr Kirke Corin and Dr Kirke Corin notified client c/o left leg feeling numb and tingling and no new orders noted; client tearful and states needs something to help her relax and Dr Kirke Corin notified and order noted

## 2013-10-10 NOTE — Discharge Instructions (Signed)

## 2013-10-10 NOTE — Progress Notes (Signed)
Report called to [facility] for 2-w-9 and transferred via bed

## 2013-10-10 NOTE — Plan of Care (Signed)
Problem: Phase I Progression Outcomes Goal: Distal pulses equal to baseline Outcome: Progressing Pulses are palpable as per assessment. Patient c/o numbness/tingling sensation in LLE since after procedure. MD aware of this. No changes Goal: Vascular site scale level 0 - I Vascular Site Scale Level 0: No bruising/bleeding/hematoma Level I (Mild): Bruising/Ecchymosis, minimal bleeding/ooozing, palpable hematoma < 3 cm Level II (Moderate): Bleeding not affecting hemodynamic parameters, pseudoaneurysm, palpable hematoma > 3 cm Level III (Severe) Bleeding which affects hemodynamic parameters or retroperitoneal hemorrhage  Outcome: Progressing Level 1. No increase in bruising. No bleeding. Goal: Pain controlled with appropriate interventions Outcome: Progressing Denies pain Goal: Voiding-avoid urinary catheter unless indicated Outcome: Completed/Met Date Met:  10/10/13 Using bedside commode Goal: Hemodynamically stable Outcome: Progressing Vitals stable.     

## 2013-10-10 NOTE — Interval H&P Note (Signed)
History and Physical Interval Note:  10/10/2013 10:46 AM  Sara Roy  has presented today for surgery, with the diagnosis of pad  The various methods of treatment have been discussed with the patient and family. After consideration of risks, benefits and other options for treatment, the patient has consented to  Procedure(s): ABDOMINAL AORTAGRAM (N/A) LOWER EXTREMITY ANGIOGRAM (Bilateral) as a surgical intervention .  The patient's history has been reviewed, patient examined, no change in status, stable for surgery.  I have reviewed the patient's chart and labs.  Questions were answered to the patient's satisfaction.     Lorine Bears

## 2013-10-10 NOTE — CV Procedure (Signed)
PERIPHERAL VASCULAR PROCEDURE  NAME:  Sara Roy   MRN: [id number] DOB:  August 16, 1960   ADMIT DATE: 10/10/2013  Performing Cardiologist: Lorine Bears Primary Physician: Ruthe Mannan, MD  Procedures Performed:  Abdominal Aortic Angiogram with Bi-Iliofemoral angiography  Right common iliac artery balloon angioplasty and stent placement  Left common iliac artery stent placement (bilateral kissing iliac stent placement extending into the distal aorta)  Mynx closure device X2     Indication(s):   Claudication    Consent: The procedure with Risks/Benefits/Alternatives and Indications was reviewed with the patient .  All questions were answered.  Medications:  Sedation:  4 mg IV Versed, 150 mcg IV Fentanyl  Contrast:  107 ml  Visipaque   Procedural details: The right groin was prepped, draped, and anesthetized with 1% lidocaine. Using modified Seldinger technique, a 4 French micropuncture sheath was introduced into the right common femoral artery. A 5 Fr Short Pigtail Catheter was advanced of over a  Versicore wire into the descending Aorta to a level just above the renal arteries. A power injection of 15ml/sec contrast over 1 sec was performed for Abdominal Aortic Angiography.  The catheter was then pulled back to a level just above the Aortic bifurcation, and a second power injection was performed to evaluate the iliac arteries.    Interventional Procedure:  The left groin was prepped, draped, and anesthetized with 1% lidocaine. Using modified Seldinger technique, a 4 French micropuncture sheath was introduced into the left common femoral artery. This was exchanged into a 7 [place] brighttip sheath. A 5 French sheath in the right common femoral artery was exchanged into a 7 [place] Bright tip sheath. The patient was initially given 6000 units of unfractionated heparin with ACT was less than 200. Thus, an additional 4000 units of heparin was given with an ACT of 236. The right common  iliac artery was occluded and was predilated with a 7 x 20 mm balloon. I then placed an 8 x 29 mm Genesis balloon expandable stent in both iliac arteries extending about 5 mm into the distal aorta. These were deployed simultaneously in a kissing fashion to 10 atmospheres. The pigtail catheter was then placed again into the distal aorta. Final angiography showed excellent results. There was mild to moderate disease in the right external iliac artery with a gradient of about 10 mm systolic. No intervention was performed on this. Hemostasis was achieved with a  Mynx closure device on both sides. The patient tolerated the procedure well with no immediate complications.   Hemodynamics:  Central Aortic Pressure / Mean Aortic Pressure: 114/59  Findings:  Abdominal aorta: Normal in size with no evidence of aortic aneurysm. There is 40% disease in the distal aorta above the bifurcation.  Left renal artery: Normal  Right renal artery: 20% proximal stenosis.  Celiac artery: Patent  Superior mesenteric artery: Patent  Right common iliac artery: Short occlusion at the ostium.  Right internal iliac artery: Patent  Right external iliac artery: Diffuse 30% disease  Right common femoral artery: Minor irregularities.  Left common iliac artery:  90% ostial stenosis  Left internal iliac artery: Normal  Left external iliac artery: Normal  Left common femoral artery: Normal  Conclusions: 1. 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 2. Successful bilateral kissing stent placement to both common iliac arteries extending into the distal aorta.  Recommendations:  Dual antiplatelet therapy for at least one month. Aggressive treatment of risk factors.  Lorine Bears, MD, [facility] 10/10/2013 12:22 PM

## 2013-10-10 NOTE — Progress Notes (Signed)
Client up to bathroom and c/o swelling left groin; hematoma felt about 5x5cm and pressure held for and left groin soft and bruised; Dr Kirke Corin notified

## 2013-10-11 ENCOUNTER — Encounter (HOSPITAL_COMMUNITY): Payer: Self-pay | Admitting: General Practice

## 2013-10-11 DIAGNOSIS — I70219 Atherosclerosis of native arteries of extremities with intermittent claudication, unspecified extremity: Secondary | ICD-10-CM | POA: Diagnosis not present

## 2013-10-11 DIAGNOSIS — F17200 Nicotine dependence, unspecified, uncomplicated: Secondary | ICD-10-CM

## 2013-10-11 DIAGNOSIS — Z7189 Other specified counseling: Secondary | ICD-10-CM

## 2013-10-11 DIAGNOSIS — I739 Peripheral vascular disease, unspecified: Secondary | ICD-10-CM

## 2013-10-11 MED ORDER — CLOPIDOGREL BISULFATE 75 MG PO TABS
75.0000 mg | ORAL_TABLET | Freq: Every day | ORAL | Status: DC
  Administered 2013-10-11: 75 mg via ORAL
  Filled 2013-10-11: qty 1

## 2013-10-11 MED ORDER — ACETAMINOPHEN 325 MG PO TABS
650.0000 mg | ORAL_TABLET | ORAL | Status: DC | PRN
  Administered 2013-10-11: 650 mg via ORAL
  Filled 2013-10-11: qty 2

## 2013-10-11 MED ORDER — ATORVASTATIN CALCIUM 40 MG PO TABS
40.0000 mg | ORAL_TABLET | Freq: Every day | ORAL | Status: DC
  Administered 2013-10-11: 40 mg via ORAL
  Filled 2013-10-11 (×2): qty 1

## 2013-10-11 MED ORDER — AMLODIPINE BESYLATE 5 MG PO TABS
5.0000 mg | ORAL_TABLET | Freq: Every day | ORAL | Status: DC
  Administered 2013-10-11: 5 mg via ORAL
  Filled 2013-10-11 (×2): qty 1

## 2013-10-11 MED ORDER — ALPRAZOLAM 0.5 MG PO TABS: 1.0000 mg | ORAL_TABLET | Freq: Two times a day (BID) | ORAL | Status: DC | PRN

## 2013-10-11 MED ORDER — ACETAMINOPHEN 325 MG PO TABS: 650.0000 mg | ORAL_TABLET | ORAL | Status: DC | PRN

## 2013-10-11 MED ORDER — ASPIRIN 81 MG PO CHEW
81.0000 mg | CHEWABLE_TABLET | Freq: Every day | ORAL | Status: DC
  Administered 2013-10-11: 81 mg via ORAL
  Filled 2013-10-11: qty 1

## 2013-10-11 NOTE — Progress Notes (Signed)
Patient Name: Sara Roy Date of Encounter: 10/11/2013  Active Problems:   PAD (peripheral artery disease)    Patient Profile: 53 yo female with hx of PAD, HTN, HLD, tobacco abuse, no previous cardiac hx admitted on 9/02 for lower bilateral extremity angiogram and abdominal aortagra is s/p left common iliac artery stent placement and right common iliac artery balloon angioplasty and stent placement.  SUBJECTIVE: She is doing well. Able to get up and walk the hallways. Still experiencing some claudication symptoms, however unable to differentiate if pain is soreness or actual muscle cramping. Eager to be discharged home today.   OBJECTIVE Filed Vitals:   10/10/13 2200 10/10/13 2300 10/11/13 0100 10/11/13 0450  BP: 127/63 152/65 145/68 127/76  Pulse: 70  87 96  Temp:   98.2 F (36.8 C) 98.6 F (37 C)  TempSrc:   Oral Oral  Resp:   16 18  Height:      Weight:    213 lb 3 oz (96.7 kg)  SpO2:   96% 95%    Intake/Output Summary (Last 24 hours) at 10/11/13 1135 Last data filed at 10/11/13 0300  Gross per 24 hour  Intake      0 ml  Output    700 ml  Net   -700 ml   Filed Weights   10/10/13 0821 10/10/13 1711 10/11/13 0450  Weight: 213 lb (96.616 kg) 213 lb (96.616 kg) 213 lb 3 oz (96.7 kg)    PHYSICAL EXAM General: Well developed, well nourished, obese female in no acute distress. Head: Normocephalic, atraumatic.  Neck: Supple without bruits, JVD not elevated. Lungs:  Resp regular and unlabored, CTA. Heart: RRR, S1, S2, no S3, S4, or murmur; no rub. Abdomen: Soft, non-tender, non-distended, BS + x 4.  Extremities: ecchymoses present in the groin region bilaterally. About 8cm x 11cm. Right slightly larger than the left. Dressing remains intact.  1+ pulses PT bilaterally, L>R DP. 2+ radial pulses bilaterally. No clubbing, cyanosis, no edema. Bilateral femoral bruits noted, R>L (no femoral pulses PTA). Neuro: Alert and oriented X 3. Moves all extremities  spontaneously. Psych: Normal affect.  TELE:  SR with occasional PACs.     Peripheral vascular Procedure 9/02 Findings:  Abdominal aorta: Normal in size with no evidence of aortic aneurysm. There is 40% disease in the distal aorta above the bifurcation.  Left renal artery: Normal  Right renal artery: 20% proximal stenosis.  Celiac artery: Patent  Superior mesenteric artery: Patent  Right common iliac artery: Short occlusion at the ostium.  Right internal iliac artery: Patent  Right external iliac artery: Diffuse 30% disease  Right common femoral artery: Minor irregularities.  Left common iliac artery: 90% ostial stenosis  Left internal iliac artery: Normal  Left external iliac artery: Normal  Left common femoral artery: Normal Conclusions:  1. 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  2. Successful bilateral kissing stent placement to both common iliac arteries extending into the distal aorta.  Recommendations:  Dual antiplatelet therapy for at least one month. Aggressive treatment of risk factors.  Current Medications:  . amLODipine  5 mg Oral Daily  . aspirin  81 mg Oral Daily  . atorvastatin  40 mg Oral q1800  . clopidogrel  75 mg Oral Daily    ASSESSMENT AND PLAN: Active Problems:   PAD (peripheral artery disease)- s/p stent placement to both common iliac arteries extending to the aorta 9/02 by Dr. Kirke Corin, dual  antiplatelet therapy for a month. On ASA and Plavix. Aggresive treatment of risk factors (weight loss and tobacco cessation). Counseled on home care for wound sites. Tylenol or ibuprofen PRN for pain. F/u in the office as scheduled  HTN- on amlodipine. F/u with PCP  HLD- on statin. F/u with PCP  Tobacco abuse- cessation counseled.  D/C home today.   Signed,  Daine Floras, PA-S2  Seen and agree with changes made. Theodore Demark , PA-C 11:35 AM 10/11/2013   Patient seen and examined. Agree with  assessment and plan. Discussed the absolute importance of smoking cessation. Good distal pulses. DC today and f/u with Dr. Konrad Dolores, MD, [facility] 10/11/2013 11:57 AM

## 2013-10-11 NOTE — Progress Notes (Signed)
Pt discharged to home per MD order. Pt received and reviewed all discharge instructions and medication information including follow-up appointments and prescription information. Pt verbalized understanding. Pt alert and oriented at discharge with no complaints of pain. Pt IV and telemetry box removed prior to discharge. Pt escorted to private vehicle via wheelchair by guest services volunteer. Sara Roy C  

## 2013-10-11 NOTE — Discharge Summary (Signed)
CARDIOLOGY DISCHARGE SUMMARY   Patient ID: Sara Roy MRN: [id number] DOB/AGE: 06/06/1960 53 y.o.  Admit date: 10/10/2013 Discharge date: 10/11/2013  PCP: Ruthe Mannan, MD Primary Cardiologist: Dr. Kirke Corin  Primary Discharge Diagnosis:  PAD Secondary Discharge Diagnosis:  Hypertension Tobacco use  Procedures Performed:  Abdominal Aortic Angiogram with Bi-Iliofemoral angiography  Right common iliac artery balloon angioplasty and stent placement  Left common iliac artery stent placement (bilateral kissing iliac stent placement extending into the distal aorta)  Mynx closure device X2   Hospital Course: Sara Roy is a 53 y.o. female with a history of PAD but not CAD. She was evaluated by Dr. Kirke Corin for claudication symptoms and was noted to have severely reduced ABIs bilaterally. She was scheduled for peripheral vascular catheterization and came to the hospital for the procedure on 09/02.  Procedure results are reproduced below. She had successful kissing balloon stent placement to both common iliac arteries extending up into the aorta. She tolerated the procedure well.  On 09/03, she was seen by Dr. Tresa Endo and all data were reviewed. The importance of smoking cessation was discussed and emphasized as well as compliance with medications. She had ecchymosis on her left groin cath site but no hematoma. Femoral bruits were noted bilaterally, but femoral pulses had been absent at her preadmission exam. She was ambulating without chest pain or shortness of breath and feeling much better. No further inpatient workup is indicated and she is considered stable for discharge, to follow up as an outpatient.   PROCEDURES: Peripheral vascular Cath: 10/10/2013 Interventional Procedure:  The left groin was prepped, draped, and anesthetized with 1% lidocaine. Using modified Seldinger technique, a 4 French micropuncture sheath was introduced into the left common femoral artery. This was exchanged into  a 7 [place] brighttip sheath. A 5 French sheath in the right common femoral artery was exchanged into a 7 [place] Bright tip sheath. The patient was initially given 6000 units of unfractionated heparin with ACT was less than 200. Thus, an additional 4000 units of heparin was given with an ACT of 236. The right common iliac artery was occluded and was predilated with a 7 x 20 mm balloon. I then placed an 8 x 29 mm Genesis balloon expandable stent in both iliac arteries extending about 5 mm into the distal aorta. These were deployed simultaneously in a kissing fashion to 10 atmospheres. The pigtail catheter was then placed again into the distal aorta. Final angiography showed excellent results. There was mild to moderate disease in the right external iliac artery with a gradient of about 10 mm systolic. No intervention was performed on this.  Hemostasis was achieved with a Mynx closure device on both sides. The patient tolerated the procedure well with no immediate complications.  Hemodynamics:  Central Aortic Pressure / Mean Aortic Pressure: 114/59  Findings:  Abdominal aorta: Normal in size with no evidence of aortic aneurysm. There is 40% disease in the distal aorta above the bifurcation.  Left renal artery: Normal  Right renal artery: 20% proximal stenosis.  Celiac artery: Patent  Superior mesenteric artery: Patent  Right common iliac artery: Short occlusion at the ostium.  Right internal iliac artery: Patent  Right external iliac artery: Diffuse 30% disease  Right common femoral artery: Minor irregularities.  Left common iliac artery: 90% ostial stenosis  Left internal iliac artery: Normal  Left external iliac artery: Normal  Left common femoral artery: Normal Conclusions:  1. Occluded ostial right common iliac artery and severe  ostial left common iliac artery stenosis.  2. Successful bilateral kissing stent placement to both common iliac arteries extending into the distal aorta.    Recommendations:  Dual antiplatelet therapy for at least one month. Aggressive treatment of risk factors.   FOLLOW UP PLANS AND APPOINTMENTS Allergies  Allergen Reactions  . Lisinopril     Rash   . Penicillins Hives  . Losartan Rash     Medication List         acetaminophen 325 MG tablet  Commonly known as:  TYLENOL  Take 2 tablets (650 mg total) by mouth every 4 (four) hours as needed for headache or mild pain. Limit ibuprofen while on aspirin and Plavix, to avoid stomach upset.     ALPRAZolam 0.25 MG tablet  Commonly known as:  XANAX  Take 0.25 mg by mouth 3 (three) times daily as needed for anxiety.     amLODipine 5 MG tablet  Commonly known as:  NORVASC  Take 1 tablet (5 mg total) by mouth daily.     aspirin 325 MG tablet  Take 325 mg by mouth daily.     atorvastatin 40 MG tablet  Commonly known as:  LIPITOR  Take 1 tablet (40 mg total) by mouth daily.     clopidogrel 75 MG tablet  Commonly known as:  PLAVIX  Take 1 tablet (75 mg total) by mouth daily.     fluocinonide-emollient 0.05 % cream  Commonly known as:  LIDEX-E  Apply 1 application topically as needed (for rash).     hydrochlorothiazide 25 MG tablet  Commonly known as:  HYDRODIURIL  Take 25 mg by mouth daily.     ibuprofen 200 MG tablet  Commonly known as:  ADVIL,MOTRIN  Take 800 mg by mouth every 6 (six) hours as needed (for pain).     potassium chloride 10 MEQ tablet  Commonly known as:  K-DUR,KLOR-CON  TAKE 1 TABLET BY MOUTH EVERY DAY        Discharge Instructions   Diet - low sodium heart healthy    Complete by:  As directed      Increase activity slowly    Complete by:  As directed           Follow-up Information   Follow up with Lorine Bears, MD. (The office will call)    Specialty:  Cardiology   Contact information:   [address] [place] [address] [phone]       BRING ALL MEDICATIONS WITH YOU TO FOLLOW UP APPOINTMENTS  Time spent with patient  to include physician time: 41 min Signed: Theodore Demark, PA-C 10/11/2013, 12:02 PM Co-Sign MD

## 2013-10-22 ENCOUNTER — Telehealth: Payer: Self-pay

## 2013-10-22 NOTE — Telephone Encounter (Signed)
Pt states she had stents 2 weeks ago, states her left leg is swelling, and Friday she had a sharp pain in that same leg. Please call.

## 2013-10-22 NOTE — Telephone Encounter (Signed)
Yes, bring her to see Sara Roy .

## 2013-10-22 NOTE — Telephone Encounter (Signed)
Verbally informed Dr. Kirke Corin that the closest available appt we have is later than her current appt  He instructed for the patient to keep her current appointment Call or go to the ED if the leg becomes symptomatic again  Patient verbalized understanding

## 2013-10-22 NOTE — Telephone Encounter (Signed)
Patient stated she had stents placed 2 weeks ago  Friday she had a pain in her left leg  She stated the pain was "very sharp and felt like something was tearing" Her leg was also swollen over the weekend  She states her leg has improved today and the swelling has gone down  She pre medicated with tylenol this morning and is not currently in pain

## 2013-10-26 ENCOUNTER — Encounter: Payer: Self-pay | Admitting: Cardiovascular Disease

## 2013-10-26 ENCOUNTER — Ambulatory Visit (INDEPENDENT_AMBULATORY_CARE_PROVIDER_SITE_OTHER): Payer: No Typology Code available for payment source | Admitting: *Deleted

## 2013-10-26 ENCOUNTER — Ambulatory Visit (INDEPENDENT_AMBULATORY_CARE_PROVIDER_SITE_OTHER): Payer: No Typology Code available for payment source | Admitting: Cardiovascular Disease

## 2013-10-26 VITALS — BP 130/83 | HR 78 | Ht 67.0 in | Wt 212.8 lb

## 2013-10-26 DIAGNOSIS — F17200 Nicotine dependence, unspecified, uncomplicated: Secondary | ICD-10-CM

## 2013-10-26 DIAGNOSIS — I739 Peripheral vascular disease, unspecified: Secondary | ICD-10-CM

## 2013-10-26 DIAGNOSIS — Z720 Tobacco use: Secondary | ICD-10-CM

## 2013-10-26 DIAGNOSIS — E785 Hyperlipidemia, unspecified: Secondary | ICD-10-CM

## 2013-10-26 MED ORDER — ASPIRIN EC 81 MG PO TBEC: 81.0000 mg | DELAYED_RELEASE_TABLET | Freq: Every day | ORAL | Status: AC

## 2013-10-26 NOTE — Patient Instructions (Signed)
Your physician has requested that you have an ankle brachial index (ABI). During this test an ultrasound and blood pressure cuff are used to evaluate the arteries that supply the arms and legs with blood. Allow thirty minutes for this exam. There are no restrictions or special instructions.  Your physician has requested that you have an abdominal aorta duplex. During this test, an ultrasound is used to evaluate the aorta. Allow 30 minutes for this exam. Do not eat after midnight the day before and avoid carbonated beverages  Please have your ABI as soon as possible. You can have your Aorta Iliac Duplex the same day as your 3 month follow up.   Your physician has recommended you make the following change in your medication:  Decrease Aspirin to 81 mg once daily   Please follow dietary information given.  Your physician recommends that you schedule a follow-up appointment in:  3 months with Dr. Kirke Corin

## 2013-10-26 NOTE — Progress Notes (Signed)
Primary care physician: Dr. Dayton Martes  HPI  This is a 53 year old female who is here today for a follow up visit regarding peripheral arterial disease. She has no previous cardiac history. She has chronic medical conditions that include hypertension, hyperlipidemia and prolonged tobacco use. She smokes one half pack per day and has been doing so for more than 40 years. She was seen recently for severe bilateral leg and back claudication.  Noninvasive evaluation showed moderately to severely reduced ABI bilaterally with evidence of severe inflow disease. I proceeded with angiography on 10/10/13 which showed :   1. 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  2. Successful bilateral kissing stent placement to both common iliac arteries extending into the distal aorta.  She had moderate left groin hematoma and thus was observed overnight. She had discomfort there which gradually improved. She feels better overall but has not done much walking.   Allergies  Allergen Reactions  . Lisinopril     Rash   . Penicillins Hives  . Losartan Rash     Current Outpatient Prescriptions on File Prior to Visit  Medication Sig Dispense Refill  . acetaminophen (TYLENOL) 325 MG tablet Take 2 tablets (650 mg total) by mouth every 4 (four) hours as needed for headache or mild pain. Limit ibuprofen while on aspirin and Plavix, to avoid stomach upset.      Marland Kitchen ALPRAZolam (XANAX) 0.25 MG tablet Take 0.25 mg by mouth 3 (three) times daily as needed for anxiety.      Marland Kitchen amLODipine (NORVASC) 5 MG tablet Take 1 tablet (5 mg total) by mouth daily.  90 tablet  1  . aspirin 325 MG tablet Take 325 mg by mouth daily.      Marland Kitchen atorvastatin (LIPITOR) 40 MG tablet Take 1 tablet (40 mg total) by mouth daily.  90 tablet  3  . clopidogrel (PLAVIX) 75 MG tablet Take 1 tablet (75 mg total) by mouth daily.  30 tablet  3  . fluocinonide-emollient (LIDEX-E) 0.05 % cream Apply 1 application  topically as needed (for rash).       . hydrochlorothiazide (HYDRODIURIL) 25 MG tablet Take 25 mg by mouth daily.      Marland Kitchen ibuprofen (ADVIL,MOTRIN) 200 MG tablet Take 800 mg by mouth every 6 (six) hours as needed (for pain).      . potassium chloride (K-DUR,KLOR-CON) 10 MEQ tablet TAKE 1 TABLET BY MOUTH EVERY DAY  30 tablet  0   No current facility-administered medications on file prior to visit.     Past Medical History  Diagnosis Date  . Anxiety   . Family history of early CAD   . Tobacco abuse   . Hypertension   . Hyperlipidemia   . Peripheral vascular disease      Past Surgical History  Procedure Laterality Date  . Cholecystectomy    . Abdominal hysterectomy    . Skin cancer excision    . Lower extremity angiogram Bilateral 10/10/2013    ILIAC   BILATERAL   BY DR ARDA     Family History  Problem Relation Age of Onset  . Heart disease Father      History   Social History  . Marital Status: Married    Spouse Name: N/A    Number of Children: N/A  . Years of Education: N/A   Occupational History  . Not on file.   Social History Main Topics  . Smoking status: Current Every Day  Smoker -- 1.50 packs/day for 40 years    Types: Cigarettes  . Smokeless tobacco: Never Used  . Alcohol Use: No  . Drug Use: Yes    Special: Marijuana  . Sexual Activity: Not on file   Other Topics Concern  . Not on file   Social History Narrative  . No narrative on file     ROS A 10 point review of system was performed. It is negative other than that mentioned in the history of present illness.   PHYSICAL EXAM   BP 130/83  Pulse 78  Ht  (1.702 m)  Wt 212 lb 12 oz (96.503 kg)  BMI 33.31 kg/m2 Constitutional: She is oriented to person, place, and time. She appears well-developed and well-nourished. No distress.  HENT: No nasal discharge.  Head: Normocephalic and atraumatic.  Eyes: Pupils are equal and round. No discharge.  Neck: Normal range of motion. Neck supple.  No JVD present. No thyromegaly present.  Cardiovascular: Normal rate, regular rhythm, normal heart sounds. Exam reveals no gallop and no friction rub. No murmur heard.  Pulmonary/Chest: Effort normal and breath sounds normal. No stridor. No respiratory distress. She has no wheezes. She has no rales. She exhibits no tenderness.  Abdominal: Soft. Bowel sounds are normal. She exhibits no distension. There is no tenderness. There is no rebound and no guarding.  Musculoskeletal: Normal range of motion. She exhibits no edema and no tenderness.  Neurological: She is alert and oriented to person, place, and time. Coordination normal.  Skin: Skin is warm and dry. No rash noted. She is not diaphoretic. No erythema. No pallor.  Psychiatric: She has a normal mood and affect. Her behavior is normal. Judgment and thought content normal.  Vascular: Femoral pulses are normal bilaterally. Radial pulses are normal. Distal pulses are palpable. No hematoma      ASSESSMENT AND PLAN

## 2013-10-27 LAB — HEPATIC FUNCTION PANEL
ALT: 29 IU/L (ref 0–32)
AST: 19 IU/L (ref 0–40)
Albumin: 4.1 g/dL (ref 3.5–5.5)
Alkaline Phosphatase: 119 IU/L — ABNORMAL HIGH (ref 39–117)
Bilirubin, Direct: 0.13 mg/dL (ref 0.00–0.40)
Total Bilirubin: 0.4 mg/dL (ref 0.0–1.2)
Total Protein: 6.6 g/dL (ref 6.0–8.5)

## 2013-10-27 LAB — LIPID PANEL
CHOLESTEROL TOTAL: 139 mg/dL (ref 100–199)
Chol/HDL Ratio: 3.4 ratio units (ref 0.0–4.4)
HDL: 41 mg/dL (ref >39)
LDL Calculated: 76 mg/dL (ref 0–99)
Triglycerides: 112 mg/dL (ref 0–149)
VLDL CHOLESTEROL CAL: 22 mg/dL (ref 5–40)

## 2013-10-30 NOTE — Assessment & Plan Note (Signed)
I again discussed the importance of smoking cessation. 

## 2013-10-30 NOTE — Assessment & Plan Note (Signed)
She is doing well after recent stenting of bilateral common iliac arteries for severe claudication.  Check ABI now and aortoiliac duplex in 3 months. Continue dual antiplatelet therapy for now.

## 2013-10-30 NOTE — Assessment & Plan Note (Signed)
Lab Results  Component Value Date   CHOL  08/18/2011   HDL 41 10/26/2013   LDLCALC 76 10/26/2013     08/18/2011   TRIG 112 10/26/2013   CHOLHDL 3.4 10/26/2013   Continue atorvastatin.

## 2013-11-02 ENCOUNTER — Encounter: Payer: Self-pay | Admitting: Family Medicine

## 2013-11-02 ENCOUNTER — Other Ambulatory Visit: Payer: Self-pay | Admitting: Family Medicine

## 2013-11-02 ENCOUNTER — Ambulatory Visit (INDEPENDENT_AMBULATORY_CARE_PROVIDER_SITE_OTHER): Payer: No Typology Code available for payment source | Admitting: Family Medicine

## 2013-11-02 ENCOUNTER — Encounter (INDEPENDENT_AMBULATORY_CARE_PROVIDER_SITE_OTHER): Payer: No Typology Code available for payment source

## 2013-11-02 VITALS — BP 136/80 | HR 74 | Temp 98.1°F | Wt 214.5 lb

## 2013-11-02 DIAGNOSIS — F419 Anxiety disorder, unspecified: Secondary | ICD-10-CM

## 2013-11-02 DIAGNOSIS — F411 Generalized anxiety disorder: Secondary | ICD-10-CM

## 2013-11-02 DIAGNOSIS — I739 Peripheral vascular disease, unspecified: Secondary | ICD-10-CM

## 2013-11-02 MED ORDER — ALPRAZOLAM 0.25 MG PO TABS: 0.2500 mg | ORAL_TABLET | Freq: Three times a day (TID) | ORAL | Status: DC | PRN

## 2013-11-02 MED ORDER — FLUCONAZOLE 150 MG PO TABS: 150.0000 mg | ORAL_TABLET | Freq: Once | ORAL | Status: AC

## 2013-11-02 NOTE — Progress Notes (Signed)
Pre visit review using our clinic review tool, if applicable. No additional management support is needed unless otherwise documented below in the visit note. 

## 2013-11-02 NOTE — Assessment & Plan Note (Signed)
Claudication resolved s/p angio/stenting. Followed by Dr. Kirke Corin.

## 2013-11-02 NOTE — Progress Notes (Signed)
Subjective:   Patient ID: Sara Roy, female    DOB: 26-Feb-1960, 53 y.o.   MRN: [id number]  Sara Roy is a pleasant 53 y.o. year old female who presents to clinic today with Follow-up and Medication Refill  on 11/02/2013  HPI: PAD- has been followed by Dr. Kirke Corin after ABIs (ordered due to claudication) showed significant disease. Angiography from 9/2 confirmed stenosis. Had stenting of bilateral common iliac arteries.   Last saw him last week- 10/26/13- note reviewed.  Advised continued dual antiplatelet therapy and repeat duplex in 3 months.  Feels so much better.  Can walk long distances without pain.  Anxiety/insomnia- Takes very occasional xanax.  Mainly for insomnia.  Tries not to take it during the day . Feels less anxious that she did months before. Denies feeling depressed.  Current Outpatient Prescriptions on File Prior to Visit  Medication Sig Dispense Refill  . acetaminophen (TYLENOL) 325 MG tablet Take 2 tablets (650 mg total) by mouth every 4 (four) hours as needed for headache or mild pain. Limit ibuprofen while on aspirin and Plavix, to avoid stomach upset.      Marland Kitchen amLODipine (NORVASC) 5 MG tablet Take 1 tablet (5 mg total) by mouth daily.  90 tablet  1  . aspirin EC 81 MG tablet Take 1 tablet (81 mg total) by mouth daily.  90 tablet  3  . atorvastatin (LIPITOR) 40 MG tablet Take 1 tablet (40 mg total) by mouth daily.  90 tablet  3  . clopidogrel (PLAVIX) 75 MG tablet Take 1 tablet (75 mg total) by mouth daily.  30 tablet  3  . fluocinonide-emollient (LIDEX-E) 0.05 % cream Apply 1 application topically as needed (for rash).       . hydrochlorothiazide (HYDRODIURIL) 25 MG tablet Take 25 mg by mouth daily.      Marland Kitchen ibuprofen (ADVIL,MOTRIN) 200 MG tablet Take 800 mg by mouth every 6 (six) hours as needed (for pain).      . potassium chloride (K-DUR,KLOR-CON) 10 MEQ tablet TAKE 1 TABLET BY MOUTH EVERY DAY  30 tablet  0   No current facility-administered medications on  file prior to visit.    Allergies  Allergen Reactions  . Lisinopril     Rash   . Penicillins Hives  . Losartan Rash    Past Medical History  Diagnosis Date  . Anxiety   . Family history of early CAD   . Tobacco abuse   . Hypertension   . Hyperlipidemia   . Peripheral vascular disease     Past Surgical History  Procedure Laterality Date  . Cholecystectomy    . Abdominal hysterectomy    . Skin cancer excision    . Lower extremity angiogram Bilateral 10/10/2013    ILIAC   BILATERAL   BY DR ARDA    Family History  Problem Relation Age of Onset  . Heart disease Father     History   Social History  . Marital Status: Married    Spouse Name: N/A    Number of Children: N/A  . Years of Education: N/A   Occupational History  . Not on file.   Social History Main Topics  . Smoking status: Current Every Day Smoker -- 1.50 packs/day for 40 years    Types: Cigarettes  . Smokeless tobacco: Never Used  . Alcohol Use: No  . Drug Use: Yes    Special: Marijuana  . Sexual Activity: Not on file   Other Topics Concern  .  Not on file   Social History Narrative  . No narrative on file   The PMH, PSH, Social History, Family History, Medications, and allergies have been reviewed in [facility], and have been updated if relevant.     Review of Systems    See HPI Denies SI or HI No CP No SOB Trying to cut back on smoking- only 10-15 cig/day Objective:    BP 136/80  Pulse 74  Temp(Src) 98.1 F (36.7 C) (Oral)  Wt 214 lb 8 oz (97.297 kg)  SpO2 89%   Physical Exam  Nursing note and vitals reviewed. Constitutional: She appears well-developed and well-nourished. No distress.  Cardiovascular: Normal rate and regular rhythm.   Pulmonary/Chest: Effort normal and breath sounds normal.  Skin: Skin is warm and dry.  Psychiatric: She has a normal mood and affect. Her behavior is normal. Judgment and thought content normal.          Assessment & Plan:   Anxiety  PAD  (peripheral artery disease) No Follow-up on file.

## 2013-11-02 NOTE — Assessment & Plan Note (Signed)
Well controlled with occasional xanax. Rx refilled today.

## 2013-11-03 ENCOUNTER — Other Ambulatory Visit: Payer: Self-pay | Admitting: Family Medicine

## 2013-12-19 ENCOUNTER — Other Ambulatory Visit (HOSPITAL_COMMUNITY): Payer: Self-pay | Admitting: *Deleted

## 2013-12-19 DIAGNOSIS — I739 Peripheral vascular disease, unspecified: Secondary | ICD-10-CM

## 2014-01-10 ENCOUNTER — Other Ambulatory Visit: Payer: Self-pay | Admitting: Family Medicine

## 2014-01-15 ENCOUNTER — Telehealth: Payer: Self-pay

## 2014-01-15 NOTE — Telephone Encounter (Signed)
I would suggest a dermatologist.

## 2014-01-15 NOTE — Telephone Encounter (Signed)
Pt has spot on back that pt is not sure if it is a wart or skin CA or what the spot is; pt noticed about 2 months ago and recently noticed spot is larger and is itching. Pt wants to know if Dr Dayton MartesAron could remove or does pt need to see dermatologist. Pt request cb.

## 2014-01-15 NOTE — Telephone Encounter (Signed)
Spoke to pt and advised per Dr Aron; pt verbally expressed understanding.  

## 2014-01-17 ENCOUNTER — Encounter (HOSPITAL_COMMUNITY): Payer: Self-pay | Admitting: Cardiovascular Disease

## 2014-01-21 ENCOUNTER — Other Ambulatory Visit: Payer: Self-pay | Admitting: Family Medicine

## 2014-01-22 NOTE — Telephone Encounter (Signed)
Rx called in to requested pharmacy 

## 2014-01-22 NOTE — Telephone Encounter (Signed)
Pt requesting medication refill. Last f/u appt 09/2013. pls advise 

## 2014-01-25 ENCOUNTER — Encounter (INDEPENDENT_AMBULATORY_CARE_PROVIDER_SITE_OTHER): Payer: No Typology Code available for payment source

## 2014-01-25 ENCOUNTER — Encounter: Payer: Self-pay | Admitting: Cardiovascular Disease

## 2014-01-25 ENCOUNTER — Ambulatory Visit (INDEPENDENT_AMBULATORY_CARE_PROVIDER_SITE_OTHER): Payer: No Typology Code available for payment source | Admitting: Cardiovascular Disease

## 2014-01-25 VITALS — BP 132/70 | HR 64 | Ht 67.0 in | Wt 213.8 lb

## 2014-01-25 DIAGNOSIS — I739 Peripheral vascular disease, unspecified: Secondary | ICD-10-CM

## 2014-01-25 DIAGNOSIS — I700 Atherosclerosis of aorta: Secondary | ICD-10-CM

## 2014-01-25 DIAGNOSIS — E785 Hyperlipidemia, unspecified: Secondary | ICD-10-CM

## 2014-01-25 DIAGNOSIS — I10 Essential (primary) hypertension: Secondary | ICD-10-CM

## 2014-01-25 DIAGNOSIS — Z720 Tobacco use: Secondary | ICD-10-CM

## 2014-01-25 NOTE — Assessment & Plan Note (Signed)
Blood pressure is well controlled on current medications. 

## 2014-01-25 NOTE — Patient Instructions (Signed)
Your physician has requested that you have an abdominal aorta duplex in 3 months. During this test, an ultrasound is used to evaluate the aorta. Allow 30 minutes for this exam. Do not eat after midnight the day before and avoid carbonated beverages.  Your physician wants you to follow-up in: 6 months with Dr. Kirke CorinArida. You will receive a reminder letter in the mail two months in advance. If you don't receive a letter, please call our office to schedule the follow-up appointment.  Your physician recommends that you continue on your current medications as directed. Please refer to the Current Medication list given to you today.

## 2014-01-25 NOTE — Progress Notes (Signed)
Primary care physician: Dr. Dayton MartesAron  HPI  This is a 53 year old female who is here today for a follow up visit regarding peripheral arterial disease. She has no previous cardiac history. She has chronic medical conditions that include hypertension, hyperlipidemia and prolonged tobacco use. She smokes one half pack per day and has been doing so for more than 40 years. She was seen a few months ago for severe bilateral leg and back claudication.  Noninvasive evaluation showed moderately to severely reduced ABI bilaterally with evidence of severe inflow disease. I proceeded with angiography on 10/10/13 which showed :   1. 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  2. Successful bilateral kissing stent placement to both common iliac arteries extending into the distal aorta.  Claudication improved significantly. She continues to have chronic lower back pain which does not seem to be related to peripheral arterial disease. Noninvasive studies today shows normal ABI bilaterally with patent stents.  Allergies  Allergen Reactions  . Lisinopril     Rash   . Penicillins Hives  . Losartan Rash     Current Outpatient Prescriptions on File Prior to Visit  Medication Sig Dispense Refill  . acetaminophen (TYLENOL) 325 MG tablet Take 2 tablets (650 mg total) by mouth every 4 (four) hours as needed for headache or mild pain. Limit ibuprofen while on aspirin and Plavix, to avoid stomach upset.    Marland Kitchen. ALPRAZolam (XANAX) 0.25 MG tablet TAKE 1 TABLET BY MOUTH THREE TIMES DAILY AS NEEDED ANXIETY 90 tablet 0  . amLODipine (NORVASC) 5 MG tablet TAKE 1 TABLET BY MOUTH EVERY DAY 90 tablet 0  . aspirin EC 81 MG tablet Take 1 tablet (81 mg total) by mouth daily. 90 tablet 3  . atorvastatin (LIPITOR) 40 MG tablet Take 1 tablet (40 mg total) by mouth daily. 90 tablet 3  . clopidogrel (PLAVIX) 75 MG tablet Take 1 tablet (75 mg total) by mouth daily. 30 tablet 3  .  fluocinonide-emollient (LIDEX-E) 0.05 % cream Apply 1 application topically as needed (for rash).     . hydrochlorothiazide (HYDRODIURIL) 25 MG tablet Take 25 mg by mouth daily.    . potassium chloride (K-DUR,KLOR-CON) 10 MEQ tablet TAKE 1 TABLET BY MOUTH EVERY DAY 30 tablet 4   No current facility-administered medications on file prior to visit.     Past Medical History  Diagnosis Date  . Anxiety   . Family history of early CAD   . Tobacco abuse   . Hypertension   . Hyperlipidemia   . Peripheral vascular disease      Past Surgical History  Procedure Laterality Date  . Cholecystectomy    . Abdominal hysterectomy    . Skin cancer excision    . Lower extremity angiogram Bilateral 10/10/2013    ILIAC   BILATERAL   BY DR ARDA  . Abdominal aortagram N/A 10/10/2013    Procedure: ABDOMINAL Ronny FlurryAORTAGRAM;  Surgeon: Iran OuchMuhammad A Hendrick Pavich, MD;  [facility];  Service: Cardiovascular;  Laterality: N/A;     Family History  Problem Relation Age of Onset  . Heart disease Father      History   Social History  . Marital Status: Married    Spouse Name: N/A    Number of Children: N/A  . Years of Education: N/A   Occupational History  . Not on file.   Social History Main Topics  . Smoking status: Current Every Day Smoker -- 1.50 packs/day for  40 years    Types: Cigarettes  . Smokeless tobacco: Never Used  . Alcohol Use: No  . Drug Use: Yes    Special: Marijuana  . Sexual Activity: Not on file   Other Topics Concern  . Not on file   Social History Narrative     ROS A 10 point review of system was performed. It is negative other than that mentioned in the history of present illness.   PHYSICAL EXAM   BP 132/70 mmHg  Pulse 64  Ht 5\' 7"  (1.702 m)  Wt 213 lb 12.8 oz (96.979 kg)  BMI 33.48 kg/m2 Constitutional: She is oriented to person, place, and time. She appears well-developed and well-nourished. No distress.  HENT: No nasal discharge.  Head: Normocephalic and  atraumatic.  Eyes: Pupils are equal and round. No discharge.  Neck: Normal range of motion. Neck supple. No JVD present. No thyromegaly present.  Cardiovascular: Normal rate, regular rhythm, normal heart sounds. Exam reveals no gallop and no friction rub. No murmur heard.  Pulmonary/Chest: Effort normal and breath sounds normal. No stridor. No respiratory distress. She has no wheezes. She has no rales. She exhibits no tenderness.  Abdominal: Soft. Bowel sounds are normal. She exhibits no distension. There is no tenderness. There is no rebound and no guarding.  Musculoskeletal: Normal range of motion. She exhibits no edema and no tenderness.  Neurological: She is alert and oriented to person, place, and time. Coordination normal.  Skin: Skin is warm and dry. No rash noted. She is not diaphoretic. No erythema. No pallor.  Psychiatric: She has a normal mood and affect. Her behavior is normal. Judgment and thought content normal.  Vascular: Femoral pulses are normal bilaterally. Radial pulses are normal. Distal pulses are palpable.      ASSESSMENT AND PLAN

## 2014-01-25 NOTE — Assessment & Plan Note (Signed)
I again discussed the importance of smoking cessation. 

## 2014-01-25 NOTE — Assessment & Plan Note (Signed)
She is doing well status post stenting of bilateral common iliac arteries for severe claudication.  ABI is normal with patent stents. Continue medical therapy. Repeat aortoiliac duplex in 3 months from now. Continue treatment of risk factors.

## 2014-01-25 NOTE — Assessment & Plan Note (Signed)
Lab Results  Component Value Date   CHOL 233* 08/18/2011   HDL 41 10/26/2013   LDLCALC 76 10/26/2013   LDLDIRECT 162.7 08/18/2011   TRIG 112 10/26/2013   CHOLHDL 3.4 10/26/2013   Continue treatment with atorvastatin. Most recent LDL was 76 which is close to target of less than 70.

## 2014-01-29 ENCOUNTER — Other Ambulatory Visit: Payer: Self-pay | Admitting: Cardiovascular Disease

## 2014-01-30 NOTE — Progress Notes (Signed)
LVM 12/23 

## 2014-02-07 ENCOUNTER — Other Ambulatory Visit: Payer: Self-pay

## 2014-02-07 DIAGNOSIS — I739 Peripheral vascular disease, unspecified: Secondary | ICD-10-CM

## 2014-02-28 ENCOUNTER — Other Ambulatory Visit: Payer: Self-pay | Admitting: Radiology

## 2014-02-28 DIAGNOSIS — Z95820 Peripheral vascular angioplasty status with implants and grafts: Secondary | ICD-10-CM

## 2014-03-30 ENCOUNTER — Other Ambulatory Visit: Payer: Self-pay | Admitting: Family Medicine

## 2014-04-04 ENCOUNTER — Other Ambulatory Visit: Payer: Self-pay | Admitting: Family Medicine

## 2014-04-05 NOTE — Telephone Encounter (Signed)
Last f/u appt 10/2013 

## 2014-04-05 NOTE — Telephone Encounter (Signed)
Rx called in to requested pharmacy 

## 2014-04-07 ENCOUNTER — Other Ambulatory Visit: Payer: Self-pay | Admitting: Family Medicine

## 2014-04-22 ENCOUNTER — Encounter (INDEPENDENT_AMBULATORY_CARE_PROVIDER_SITE_OTHER): Payer: 59

## 2014-04-22 DIAGNOSIS — I739 Peripheral vascular disease, unspecified: Secondary | ICD-10-CM | POA: Diagnosis not present

## 2014-04-22 DIAGNOSIS — Z95820 Peripheral vascular angioplasty status with implants and grafts: Secondary | ICD-10-CM

## 2014-04-25 ENCOUNTER — Telehealth: Payer: Self-pay | Admitting: *Deleted

## 2014-04-25 DIAGNOSIS — I739 Peripheral vascular disease, unspecified: Secondary | ICD-10-CM

## 2014-04-25 NOTE — Telephone Encounter (Signed)
-----   Message from Iran OuchMuhammad A Arida, MD sent at 04/23/2014  2:34 PM EDT ----- Patent iliac stents with mild restenosis. Repeat aortoiliac duplex in 3 months (PAD).

## 2014-04-25 NOTE — Telephone Encounter (Signed)
Reviewed results with patient. 

## 2014-04-27 ENCOUNTER — Other Ambulatory Visit: Payer: Self-pay | Admitting: Family Medicine

## 2014-05-01 ENCOUNTER — Other Ambulatory Visit: Payer: Self-pay | Admitting: Family Medicine

## 2014-05-31 ENCOUNTER — Other Ambulatory Visit: Payer: Self-pay | Admitting: Cardiovascular Disease

## 2014-05-31 ENCOUNTER — Other Ambulatory Visit: Payer: Self-pay | Admitting: Family Medicine

## 2014-05-31 NOTE — Telephone Encounter (Signed)
Pt has not had K+ labs since 09/2013. pls advise

## 2014-06-02 NOTE — Discharge Summary (Signed)
PATIENT NAME:  Sara Roy, Sara Roy MR#:  [id number] DATE OF BIRTH:  07-26-1960  DATE OF ADMISSION:  07/23/2011 DATE OF DISCHARGE:  07/23/2011  ADMISSION DIAGNOSIS: Malignant hypertension.   DISCHARGE DIAGNOSES:  1. Malignant hypertension.   2. Headache secondary to problem #1.  3. Shortness of breath secondary to problem #1.   CONSULTS: None.   DIAGNOSTIC AND RADIOLOGICAL DATA:  A 2-D echocardiogram showed an ejection fraction of 55%. Doppler flow pattern suggestive of impaired LV relaxation, moderate concentric left ventricular hypertrophy. RVSP is normal.  MRI of the brain showed no evidence of acute cerebrovascular accident.   HOSPITAL COURSE: The patient is a 54 year old female who presented with a headache, found to have malignant hypertension. For further details, please refer to the History and Physical.   1. Malignant hypertension: The patient did not follow up with her primary care physician; therefore, she did not know that she has elevated blood pressure. Echocardiogram was suggestive of long-standing uncontrolled high blood pressure. She was placed on beta blocker, lisinopril and Norvasc. She will be discharged with metoprolol on HCTZ. She will follow up with Dr. Elease HashimotoMaloney, who is at her PCP preference. The patient will need close followup with her primary care physician. Ideally, her blood pressure should be less than 120/80, however, this should be done gradually.  2. Headache: Due to above issues.  3. Shortness of breath: From hypertension, which has improved as her blood pressure has improved.   DISCHARGE MEDICATIONS:  1. HCTZ 50 mg daily.  2. Metoprolol XL 50 mg daily.  3. Aspirin 81 mg daily.  4. Ibuprofen 10 mg, 4 tablets b.i.d. p.r.n.   DISCHARGE ACTIVITY: As tolerated.    DISCHARGE DIET: Low sodium.   DISCHARGE FOLLOWUP: The patient will follow up in one week with Dr. Lorie PhenixNancy Maloney.   TIME SPENT:   Approximately 35 minutes. ____________________________ Janyth ContesSital P. Juliene PinaMody,  MD spm:cbb D: 07/23/2011 13:15:44 ET T: 07/24/2011 16:00:13 ET JOB#: [id number]  cc: Koriana Stepien P. Juliene PinaMody, MD, <Dictator> Leo GrosserNancy J. Maloney, MD Janyth ContesSITAL P Tylee Newby MD ELECTRONICALLY SIGNED 07/25/2011 21:30

## 2014-06-02 NOTE — H&P (Signed)
PATIENT NAME:  Sara Roy, Sara Roy MR#:  [id number] DATE OF BIRTH:  12-07-1960  DATE OF ADMISSION:  07/23/2011  PRIMARY CARE PHYSICIAN: None.   SUBJECTIVE: This is a 54 year old female who presents to the ER with a chief complaint of headache for the last six days. The patient has had a upper respiratory infection for the last week or so for which she has been taking over-the-counter decongestants, subsequently developed a headache and has been taking ibuprofen for the headache which has been unrelenting and there has been no improvement with ibuprofen. She subsequently presented to the ER and was found to have a presenting blood pressure of systolic greater than 200 range, 244 to be precise. The patient was also complaining of shortness of breath and was found to be hypoxic on room air. The patient was treated with multiple medications such as hydralazine, Norvasc and has shown minimal improvement. Her headache is minimally improved with IV morphine that she received in the ER. She is being admitted for hypertensive urgency.   The patient denies any shortness of breath. The patient denies any chest pain per se but does complain of shortness of breath especially when she coughs. She is a smoker and smokes about 2 packs a day.   PAST MEDICAL HISTORY: None.   PAST SURGICAL HISTORY: None.   FAMILY HISTORY: Mother died of vulvar cancer. Father died of a massive heart attack at the age of 78.   SOCIAL HISTORY: The patient works as an Pensions consultant. She currently smokes 1 to 2 packs per day. She states that she drinks on a daily basis and has not had anything to drink for the last week or so because of her symptoms.   PHYSICAL EXAMINATION:  VITAL SIGNS: Most recent blood pressure 151/76, respirations 18, pulse 76, 96% on 2 liters.   GENERAL: Currently comfortable in no acute cardiopulmonary distress.  HEENT: Pupils equal and reactive. Extraocular movements intact.   NECK: Supple. No JVD.   LUNGS:  Clear to auscultation bilaterally. No wheezes or crackles or rhonchi.   CARDIOVASCULAR: Regular rate and rhythm. No murmurs, rubs, or gallops.   ABDOMEN: Obese, soft, nontender, nondistended, normoactive bowel sounds.   NEUROLOGIC: Cranial nerves II through XII grossly intact.   PSYCHIATRIC: Appropriate mood and affect.   LYMPHATIC: No axillary, inguinal, cervical lymphadenopathy.   SKIN: Without any acne or skin rashes.   REVIEW OF SYSTEMS: CONSTITUTIONAL: No fever, fatigue, weakness, weight loss or weight gain. EYES: No double vision, blurry vision, pain, redness, inflammation, glaucoma. ENT: No tinnitus, ear pain, hearing loss, seasonal allergies. RESPIRATORY: Does have a nonproductive cough associated with mild wheezing. No hemoptysis or dyspnea on exertion. CARDIOVASCULAR: No chest pain. No orthopnea. No edema. arrhythmia, palpitations. GENITOURINARY: No nausea, vomiting, abdominal pain, hematemesis. ENDOCRINE: No polyuria, nocturia, thyroid problems, increased swelling. NEUROLOGICAL: No numbness, weakness, dysarthria, epilepsy, tremor, vertigo, ataxia. PSYCHIATRIC: No anxiety, insomnia, bipolar disorder, depression.   LABORATORY, DIAGNOSTIC AND RADIOLOGICAL DATA: WBC 11.7, hemoglobin 16.4, hematocrit 50.6, platelet count 257, glucose 109, BUN 14, creatinine 0.78, serum sodium 141, chloride 103, total bilirubin 0.5, AST 107, ALT 16. CT of the head without contrast showed no acute abnormality. Urinalysis shows trace amount of leukocyte esterase and 20 WBCs per high-power field.   ASSESSMENT:  1. Hypertensive urgency.  2. Mild urinary tract infection.  3. Recent upper respiratory tract infection.  4. Nicotine dependence.  5. Alcohol dependence.   PLAN:  1. The patient will be admitted to telemetry given the fact that  the patient is symptomatic from her elevated blood pressures, it is imperative to maintain her on telemetry. Further imaging studies such as an MRI will be obtained to rule  out an underlying CVA. Will keep her on aspirin and start her on Coreg, lisinopril and as needed hydralazine. A 2-D echo will be obtained in the morning.  2. For her, urinary tract infection will start her on ciprofloxacin IV and send off urine culture.  3. Nicotine dependence. The patient will be continued on DuoNeb treatments. Chest x-ray appears to be clear.  4. Mild shortness of breath. Could be secondary to mild chronic obstructive pulmonary disease exacerbation versus secondary to hypertensive urgency.   5. Alcohol dependence. The patient states that she drinks every day but has not had anything to drink in the last six days because she has not bee feeling well therefore doubt that the patient will withdraw during this hospitalization, however, she should be monitored closely for this.  6. She is a FULL CODE.   _____________ time SPENT 60 MINS _______________ Richarda OverlieNayana Lyrique Hakim, MD na:cms D: 07/23/2011 00:34:21 ET T: 07/23/2011 05:22:08 ET JOB#: [id number]  cc: Richarda OverlieNayana Nevayah Faust, MD, <Dictator>  Richarda OverlieNAYANA Naamah Boggess MD ELECTRONICALLY SIGNED 07/23/2011 6:45

## 2014-06-17 ENCOUNTER — Encounter: Payer: Self-pay | Admitting: Family Medicine

## 2014-06-17 ENCOUNTER — Ambulatory Visit (INDEPENDENT_AMBULATORY_CARE_PROVIDER_SITE_OTHER): Payer: 59 | Admitting: Family Medicine

## 2014-06-17 VITALS — BP 172/84 | HR 80 | Temp 98.0°F | Wt 207.5 lb

## 2014-06-17 DIAGNOSIS — M545 Low back pain, unspecified: Secondary | ICD-10-CM

## 2014-06-17 DIAGNOSIS — I10 Essential (primary) hypertension: Secondary | ICD-10-CM

## 2014-06-17 MED ORDER — METHYLPREDNISOLONE 4 MG PO TBPK: ORAL_TABLET | ORAL | Status: DC

## 2014-06-17 MED ORDER — DEXAMETHASONE SODIUM PHOSPHATE 10 MG/ML IJ SOLN
10.0000 mg | Freq: Once | INTRAMUSCULAR | Status: AC
  Administered 2014-06-17: 10 mg via INTRAMUSCULAR

## 2014-06-17 MED ORDER — HYDROCODONE-ACETAMINOPHEN 5-325 MG PO TABS: 1.0000 | ORAL_TABLET | Freq: Four times a day (QID) | ORAL | Status: DC | PRN

## 2014-06-17 NOTE — Assessment & Plan Note (Signed)
New- IM decadron in office to help reduce inflammation, then start medrol dose back in 48-72 hours. Vicodin prn severe pain. The patient indicates understanding of these issues and agrees with the plan. Call or return to clinic prn if these symptoms worsen or fail to improve as anticipated.

## 2014-06-17 NOTE — Assessment & Plan Note (Signed)
Deteriorated today but asymptomatic and has been well controlled. Likely due to pain.  No changes made today.  She will update me.

## 2014-06-17 NOTE — Progress Notes (Signed)
Pre visit review using our clinic review tool, if applicable. No additional management support is needed unless otherwise documented below in the visit note. 

## 2014-06-17 NOTE — Patient Instructions (Signed)
Good to see you. Please start taking Medrol in 48- 72 hours. Vicodin as needed for severe pain.  Please come see me in a few days to recheck your blood pressure.

## 2014-06-17 NOTE — Addendum Note (Signed)
Addended by: Desmond DikeKNIGHT, Amiee Wiley H on: 06/17/2014 03:29 PM   Modules accepted: Orders

## 2014-06-17 NOTE — Progress Notes (Signed)
Subjective:   Patient ID: Sara Roy, female    DOB: 12/05/1960, 54 y.o.   MRN: [id number]  Sara Roy is a pleasant 54 y.o. year old female with h/o PAD, HTN, tobacco abuse, who presents to clinic today with Back Pain  on 06/17/2014  HPI:  Unfortunately her sister has been in the hospital for several months.  Since she was discharged last week, [name] has been helping her out.  Does not think she lifted something, she thinks pain started when she had to sleep on her sister's couch.  Severe back pain x 2 days.  Hurts to sit and lay down.  Feels better when she is standing.  No radiculopathy or LE weakness.  No urinary symptoms.  BP very elevated today but she thinks this is from pain.  Has been very well controlled and she is compliant with her rxs. Denies HA, blurred vision, CP or SOB. BP Readings from Last 3 Encounters:  06/17/14 172/84  01/25/14 132/70  11/02/13 136/80   Current Outpatient Prescriptions on File Prior to Visit  Medication Sig Dispense Refill  . acetaminophen (TYLENOL) 325 MG tablet Take 2 tablets (650 mg total) by mouth every 4 (four) hours as needed for headache or mild pain. Limit ibuprofen while on aspirin and Plavix, to avoid stomach upset.    Marland Kitchen. ALPRAZolam (XANAX) 0.25 MG tablet TAKE 1 TABLET BY MOUTH THREE TIMES DAILY AS NEEDED FOR ANXIETY 90 tablet 0  . amLODipine (NORVASC) 5 MG tablet TAKE 1 TABLET BY MOUTH EVERY DAY 90 tablet 0  . aspirin EC 81 MG tablet Take 1 tablet (81 mg total) by mouth daily. 90 tablet 3  . atorvastatin (LIPITOR) 40 MG tablet Take 1 tablet (40 mg total) by mouth daily. 90 tablet 3  . clopidogrel (PLAVIX) 75 MG tablet TAKE 1 TABLET BY MOUTH EVERY DAY 30 tablet 3  . fluocinonide-emollient (LIDEX-E) 0.05 % cream Apply 1 application topically as needed (for rash).     . hydrochlorothiazide (HYDRODIURIL) 25 MG tablet TAKE 1 TABLET BY MOUTH EVERY DAY 30 tablet 0  . potassium chloride (K-DUR,KLOR-CON) 10 MEQ tablet TAKE 1 TABLET BY MOUTH EVERY  DAY 30 tablet 3   No current facility-administered medications on file prior to visit.    Allergies  Allergen Reactions  . Lisinopril     Rash   . Penicillins Hives  . Losartan Rash    Past Medical History  Diagnosis Date  . Anxiety   . Family history of early CAD   . Tobacco abuse   . Hypertension   . Hyperlipidemia   . Peripheral vascular disease     Past Surgical History  Procedure Laterality Date  . Cholecystectomy    . Abdominal hysterectomy    . Skin cancer excision    . Lower extremity angiogram Bilateral 10/10/2013    ILIAC   BILATERAL   BY DR ARDA  . Abdominal aortagram N/A 10/10/2013    Procedure: ABDOMINAL Ronny FlurryAORTAGRAM;  Surgeon: Iran OuchMuhammad A Arida, MD;  [facility];  Service: Cardiovascular;  Laterality: N/A;    Family History  Problem Relation Age of Onset  . Heart disease Father     History   Social History  . Marital Status: Married    Spouse Name: N/A  . Number of Children: N/A  . Years of Education: N/A   Occupational History  . Not on file.   Social History Main Topics  . Smoking status: Current Every Day Smoker --  1.50 packs/day for 40 years    Types: Cigarettes  . Smokeless tobacco: Never Used  . Alcohol Use: No  . Drug Use: Yes    Special: Marijuana  . Sexual Activity: Not on file   Other Topics Concern  . Not on file   Social History Narrative   The PMH, PSH, Social History, Family History, Medications, and allergies [facility], and have been updated if relevant.   Review of Systems  Constitutional: Negative.   HENT: Negative.   Respiratory: Negative.   Gastrointestinal: Negative.   Genitourinary: Negative.   Musculoskeletal: Positive for back pain. Negative for gait problem.  All other systems reviewed and are negative.      Objective:    BP 172/84 mmHg  Pulse 80  Temp(Src) 98 F (36.7 C) (Oral)  Wt 207 lb 8 oz (94.121 kg)  SpO2 96%   Physical Exam  Constitutional: She is oriented to  person, place, and time. She appears well-developed and well-nourished.  Standing, tearful, obviously uncomfortable  HENT:  Head: Normocephalic.  Eyes: Conjunctivae are normal.  Cardiovascular: Normal rate.   Pulmonary/Chest: Effort normal.  Musculoskeletal:       Lumbar back: She exhibits decreased range of motion, tenderness, pain and spasm. She exhibits no bony tenderness and no edema.  SLR neg bilaterally  Neurological: She is alert and oriented to person, place, and time. No cranial nerve deficit.  Skin: Skin is warm and dry.  Psychiatric: She has a normal mood and affect. Her behavior is normal. Thought content normal.  Nursing note and vitals reviewed.         Assessment & Plan:   Midline low back pain without sciatica No Follow-up on file.

## 2014-06-24 ENCOUNTER — Other Ambulatory Visit: Payer: Self-pay | Admitting: Family Medicine

## 2014-06-24 NOTE — Telephone Encounter (Signed)
Last f/u appt 10/2013 

## 2014-06-25 NOTE — Telephone Encounter (Signed)
Rx called in to requested pharmacy 

## 2014-06-28 ENCOUNTER — Telehealth: Payer: Self-pay | Admitting: Family Medicine

## 2014-06-28 MED ORDER — AMLODIPINE BESYLATE 10 MG PO TABS: 10.0000 mg | ORAL_TABLET | Freq: Every day | ORAL | Status: DC

## 2014-06-28 NOTE — Telephone Encounter (Signed)
Spoke to pt who states her BP 192/84 last pm, 150/78 this am, and 165/77 this afternoon

## 2014-06-28 NOTE — Telephone Encounter (Signed)
Patient notified and verbalized understanding. 

## 2014-06-28 NOTE — Telephone Encounter (Signed)
Please call pt back at your convenience about blood pressure, thanks

## 2014-06-28 NOTE — Telephone Encounter (Signed)
If back pain has improved then BP remaining elevated. rec increase amlodipine to 10mg  daily (take 2 until runs out and new dose at pharmacy) and update us in 2 wks with effect. Watch for ankle swelling on higher dose. [facility]

## 2014-06-30 ENCOUNTER — Other Ambulatory Visit: Payer: Self-pay | Admitting: Family Medicine

## 2014-07-10 ENCOUNTER — Other Ambulatory Visit: Payer: Self-pay | Admitting: Cardiovascular Disease

## 2014-07-10 DIAGNOSIS — I739 Peripheral vascular disease, unspecified: Secondary | ICD-10-CM

## 2014-07-11 ENCOUNTER — Telehealth: Payer: Self-pay | Admitting: Family Medicine

## 2014-07-11 NOTE — Telephone Encounter (Signed)
So it is improving.  Not quite at goal.  Any ankle swelling?  How is her back pain?

## 2014-07-11 NOTE — Telephone Encounter (Signed)
Spoke to pt who states BP readings after starting new medication 5/22        152/87 5/23        153/77 6/1          146/71

## 2014-07-11 NOTE — Telephone Encounter (Signed)
Patient is asking for Dr.Aron to call her back about her blood pressure.  She wouldn't leave a message with triage.

## 2014-07-11 NOTE — Telephone Encounter (Signed)
Please call pt to get more information. 

## 2014-07-12 NOTE — Telephone Encounter (Signed)
Great.  Please call me next week with an update.

## 2014-07-12 NOTE — Telephone Encounter (Signed)
Spoke to pt who is not having swelling; only one day since changing meds.

## 2014-07-19 ENCOUNTER — Ambulatory Visit (INDEPENDENT_AMBULATORY_CARE_PROVIDER_SITE_OTHER): Payer: 59

## 2014-07-19 DIAGNOSIS — I739 Peripheral vascular disease, unspecified: Secondary | ICD-10-CM | POA: Diagnosis not present

## 2014-07-25 ENCOUNTER — Ambulatory Visit (INDEPENDENT_AMBULATORY_CARE_PROVIDER_SITE_OTHER): Payer: 59 | Admitting: Cardiovascular Disease

## 2014-07-25 ENCOUNTER — Other Ambulatory Visit: Payer: Self-pay

## 2014-07-25 ENCOUNTER — Encounter: Payer: Self-pay | Admitting: Cardiovascular Disease

## 2014-07-25 ENCOUNTER — Ambulatory Visit (INDEPENDENT_AMBULATORY_CARE_PROVIDER_SITE_OTHER): Payer: 59

## 2014-07-25 VITALS — BP 148/60 | HR 85 | Ht 67.0 in | Wt 210.0 lb

## 2014-07-25 DIAGNOSIS — I739 Peripheral vascular disease, unspecified: Secondary | ICD-10-CM

## 2014-07-25 DIAGNOSIS — R0789 Other chest pain: Secondary | ICD-10-CM

## 2014-07-25 DIAGNOSIS — I10 Essential (primary) hypertension: Secondary | ICD-10-CM | POA: Diagnosis not present

## 2014-07-25 DIAGNOSIS — R011 Cardiac murmur, unspecified: Secondary | ICD-10-CM | POA: Diagnosis not present

## 2014-07-25 MED ORDER — CARVEDILOL 6.25 MG PO TABS: 6.2500 mg | ORAL_TABLET | Freq: Two times a day (BID) | ORAL | Status: DC

## 2014-07-25 NOTE — Assessment & Plan Note (Signed)
Blood pressure is elevated. I added carvedilol 6.25 mg twice daily.

## 2014-07-25 NOTE — Assessment & Plan Note (Signed)
The patient has no recurrent claudication since bilateral iliac stenting in September of last year. Recent ABI was normal and duplex showed patent stents with moderately elevated velocities. Recommend a follow-up duplex in 6 months.

## 2014-07-25 NOTE — Assessment & Plan Note (Signed)
I requested an echocardiogram for evaluation. 

## 2014-07-25 NOTE — Progress Notes (Signed)
Primary care physician: Dr. Dayton Martes  HPI  This is a 54 year old female who is here today for a follow up visit regarding peripheral arterial disease. She has no previous cardiac history. She has chronic medical conditions that include hypertension, hyperlipidemia and prolonged tobacco use. She smokes one half pack per day and has been doing so for more than 40 years. She was seen last year for severe bilateral leg and back claudication.  Noninvasive evaluation showed moderately to severely reduced ABI bilaterally with evidence of severe inflow disease. I proceeded with angiography on 10/10/13 which showed :   1. 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  2. Successful bilateral kissing stent placement to both common iliac arteries extending into the distal aorta.  Claudication resolved since then. She is not complaining of substernal chest soreness which worsens with certain movements. This does not seem to be exertional. She does report worsening exertional dyspnea and extreme fatigue. But pressure has not been controlled lately. The dose of amlodipine was increased to 10 mg once daily but blood pressure is still elevated.  Allergies  Allergen Reactions  . Lisinopril     Rash   . Penicillins Hives  . Losartan Rash     Current Outpatient Prescriptions on File Prior to Visit  Medication Sig Dispense Refill  . acetaminophen (TYLENOL) 325 MG tablet Take 2 tablets (650 mg total) by mouth every 4 (four) hours as needed for headache or mild pain. Limit ibuprofen while on aspirin and Plavix, to avoid stomach upset.    Marland Kitchen ALPRAZolam (XANAX) 0.25 MG tablet TAKE 1 TABLET BY MOUTH THREE TIMES DAILY AS NEEDED FOR ANXIETY 90 tablet 0  . amLODipine (NORVASC) 10 MG tablet Take 1 tablet (10 mg total) by mouth daily. 90 tablet 0  . amLODipine (NORVASC) 5 MG tablet TAKE 1 TABLET BY MOUTH EVERY DAY 90 tablet 0  . aspirin EC 81 MG tablet Take 1 tablet (81 mg  total) by mouth daily. 90 tablet 3  . atorvastatin (LIPITOR) 40 MG tablet Take 1 tablet (40 mg total) by mouth daily. 90 tablet 3  . clopidogrel (PLAVIX) 75 MG tablet TAKE 1 TABLET BY MOUTH EVERY DAY 30 tablet 3  . fluocinonide-emollient (LIDEX-E) 0.05 % cream Apply 1 application topically as needed (for rash).     . hydrochlorothiazide (HYDRODIURIL) 25 MG tablet TAKE 1 TABLET BY MOUTH EVERY DAY 30 tablet 0  . potassium chloride (K-DUR,KLOR-CON) 10 MEQ tablet TAKE 1 TABLET BY MOUTH EVERY DAY 30 tablet 3   No current facility-administered medications on file prior to visit.     Past Medical History  Diagnosis Date  . Anxiety   . Family history of early CAD   . Tobacco abuse   . Hypertension   . Hyperlipidemia   . Peripheral vascular disease      Past Surgical History  Procedure Laterality Date  . Cholecystectomy    . Abdominal hysterectomy    . Skin cancer excision    . Lower extremity angiogram Bilateral 10/10/2013    ILIAC   BILATERAL   BY DR ARDA  . Abdominal aortagram N/A 10/10/2013    Procedure: ABDOMINAL Ronny Flurry;  Surgeon: Iran Ouch, MD;  Location: [facility] CATH LAB;  Service: Cardiovascular;  Laterality: N/A;     Family History  Problem Relation Age of Onset  . Heart disease Father      History   Social History  . Marital Status: Married  Spouse Name: N/A  . Number of Children: N/A  . Years of Education: N/A   Occupational History  . Not on file.   Social History Main Topics  . Smoking status: Current Every Day Smoker -- 1.50 packs/day for 40 years    Types: Cigarettes  . Smokeless tobacco: Never Used  . Alcohol Use: No  . Drug Use: Yes    Special: Marijuana  . Sexual Activity: Not on file   Other Topics Concern  . Not on file   Social History Narrative     ROS A 10 point review of system was performed. It is negative other than that mentioned in the history of present illness.   PHYSICAL EXAM   BP 148/60 mmHg  Pulse 85  Ht   (1.702 m)  Wt 210 lb (95.255 kg)  BMI 32.88 kg/m2 Constitutional: She is oriented to person, place, and time. She appears well-developed and well-nourished. No distress.  HENT: No nasal discharge.  Head: Normocephalic and atraumatic.  Eyes: Pupils are equal and round. No discharge.  Neck: Normal range of motion. Neck supple. No JVD present. No thyromegaly present.  Cardiovascular: Normal rate, regular rhythm, normal heart sounds. Exam reveals no gallop and no friction rub. There is a 2/6 systolic ejection murmur in the aortic area  Pulmonary/Chest: Effort normal and breath sounds normal. No stridor. No respiratory distress. She has no wheezes. She has no rales. She exhibits no tenderness.  Abdominal: Soft. Bowel sounds are normal. She exhibits no distension. There is no tenderness. There is no rebound and no guarding.  Musculoskeletal: Normal range of motion. She exhibits mild edema and no tenderness.  Neurological: She is alert and oriented to person, place, and time. Coordination normal.  Skin: Skin is warm and dry. No rash noted. She is not diaphoretic. No erythema. No pallor.  Psychiatric: She has a normal mood and affect. Her behavior is normal. Judgment and thought content normal.  Vascular: Femoral pulses are normal bilaterally. Radial pulses are normal. Distal pulses are palpable.    ZOX:[id number]  Rhythm  -Nonspecific QRS widening.   BORDERLINE   ASSESSMENT AND PLAN

## 2014-07-25 NOTE — Assessment & Plan Note (Signed)
This seems to be musculoskeletal. However, she has significant exertional dyspnea and fatigue. Thus, I requested evaluation with a pharmacologic nuclear stress test. She is not able to exercise on a treadmill due to chronic back pain.

## 2014-07-25 NOTE — Patient Instructions (Addendum)
Medication Instructions:  Your physician has recommended you make the following change in your medication:  1) START taking coreg 6.25mg  twice per day   Labwork: none  Testing/Procedures: Your physician has requested that you have a lexiscan myoview.   [facility] MYOVIEW  Your caregiver has ordered a Stress Test with nuclear imaging. The purpose of this test is to evaluate the blood supply to your heart muscle. This procedure is referred to as a "Non-Invasive Stress Test." This is because other than having an IV started in your vein, nothing is inserted or "invades" your body. Cardiac stress tests are done to find areas of poor blood flow to the heart by determining the extent of coronary artery disease (CAD). Some patients exercise on a treadmill, which naturally increases the blood flow to your heart, while others who are  unable to walk on a treadmill due to physical limitations have a pharmacologic/chemical stress agent called Lexiscan . This medicine will mimic walking on a treadmill by temporarily increasing your coronary blood flow.   Please note: these test may take anywhere between 2-4 hours to complete  PLEASE REPORT TO [facility] MEDICAL MALL ENTRANCE  THE VOLUNTEERS AT THE FIRST DESK WILL DIRECT YOU WHERE TO GO  Date of Procedure: Tuesday, June 21, 8:00am  Arrival Time for Procedure: 7:30am  Instructions regarding medication:     _xx___:  Hold betablocker(s) night before procedure and morning of procedure - Do not take carvidilol/coreg  the morning of your test.    PLEASE NOTIFY THE OFFICE AT LEAST 24 HOURS IN ADVANCE IF YOU ARE UNABLE TO KEEP YOUR APPOINTMENT.  [phone] AND  PLEASE NOTIFY NUCLEAR MEDICINE AT [facility] AT LEAST 24 HOURS IN ADVANCE IF YOU ARE UNABLE TO KEEP YOUR APPOINTMENT. [phone]  How to prepare for your Myoview test:  1. Do not eat or drink after midnight 2. No caffeine for 24 hours prior to test 3. No smoking 24 hours prior to test. 4. Your medication  may be taken with water.  If your doctor stopped a medication because of this test, do not take that medication. 5. Ladies, please do not wear dresses.  Skirts or pants are appropriate. Please wear a short sleeve shirt. 6. No perfume, cologne or lotion. 7. Wear comfortable walking shoes. No heels!           Your physician has requested that you have an echocardiogram. Echocardiography is a painless test that uses sound waves to create images of your heart. It provides your doctor with information about the size and shape of your heart and how well your heart's chambers and valves are working. This procedure takes approximately one hour. There are no restrictions for this procedure.    Follow-Up: Your physician recommends that you schedule a follow-up appointment in: three months with Dr. Kirke Corin.    Any Other Special Instructions Will Be Listed Below (If Applicable).

## 2014-07-28 ENCOUNTER — Other Ambulatory Visit: Payer: Self-pay | Admitting: Family Medicine

## 2014-07-30 ENCOUNTER — Encounter
Admission: RE | Admit: 2014-07-30 | Discharge: 2014-07-30 | Disposition: A | Discharge status: Home / Self Care | Payer: 59 | Source: Ambulatory Visit | Attending: Cardiovascular Disease | Admitting: Cardiovascular Disease

## 2014-07-30 DIAGNOSIS — R0789 Other chest pain: Secondary | ICD-10-CM

## 2014-07-30 MED ORDER — TECHNETIUM TC 99M SESTAMIBI - CARDIOLITE
34.5000 | Freq: Once | INTRAVENOUS | Status: AC | PRN
  Administered 2014-07-30: 09:00:00 34.5 via INTRAVENOUS

## 2014-07-30 MED ORDER — REGADENOSON 0.4 MG/5ML IV SOLN
0.4000 mg | Freq: Once | INTRAVENOUS | Status: AC
  Administered 2014-07-30: 0.4 mg via INTRAVENOUS
  Filled 2014-07-30: qty 5

## 2014-07-30 MED ORDER — TECHNETIUM TC 99M SESTAMIBI - CARDIOLITE
14.1000 | Freq: Once | INTRAVENOUS | Status: AC | PRN
  Administered 2014-07-30: 14.1 via INTRAVENOUS

## 2014-07-31 LAB — NM MYOCAR MULTI W/SPECT W/WALL MOTION / EF
CHL CUP NUCLEAR SRS: 3
CSEPHR: 52 %
CSEPPHR: 86 {beats}/min
LV dias vol: 87 mL
LV sys vol: 40 mL
Rest HR: 65 {beats}/min
SDS: 5
SSS: 6
TID: 0.94

## 2014-08-02 ENCOUNTER — Telehealth: Payer: Self-pay | Admitting: *Deleted

## 2014-08-02 NOTE — Telephone Encounter (Signed)
Pt is asking for results on stress test she did on Tuesday.  Please call when ready.

## 2014-08-02 NOTE — Telephone Encounter (Signed)
Reviewed results with patient. 

## 2014-08-15 ENCOUNTER — Telehealth: Payer: Self-pay | Admitting: Family Medicine

## 2014-08-15 ENCOUNTER — Telehealth: Payer: Self-pay | Admitting: *Deleted

## 2014-08-15 NOTE — Telephone Encounter (Signed)
Spoke w/ pt.  She reports increased leg edema since her last ov.  She reports that Dr. Kirke CorinArida started her on Coreg 6.25 mg BID on 07/25/14. She has been on amlodipine 5 mg for approx 3 yrs, but this was increased to 10 mg by PCP on 06/28/14. She is unsure if swelling is r/t one or both of these. She does report that she has been trying to drink more water and does not weigh herself daily.  Pt is HOH, has 2 hearing aids and has difficulty hearing over the phone.

## 2014-08-15 NOTE — Telephone Encounter (Signed)
Pt c/o swelling: STAT is pt has developed SOB within 24 hours  1. How long have you been experiencing swelling? Couple of weeks   2. Where is the swelling located? Both feet and ankles  3.  Are you currently taking a "fluid pill"? Patient not sure  4.  Are you currently SOB? No  5.  Have you traveled recently? No

## 2014-08-15 NOTE — Telephone Encounter (Signed)
Pt called wanting Dr. Dayton MartesAron to call her at her convenience. She did not want to speak to triage she wanted specifically to for Dr. Dayton MartesAron to call her.

## 2014-08-15 NOTE — Telephone Encounter (Signed)
Spoke to pt.  Does not like the way norvasc or coreg (started by Dr. Kirke CorinArida) make her feel.  Wants me to adjust them and I advise that she either come in here or go see Dr. Kirke CorinArida.  She said she will call Dr. Kirke CorinArida.

## 2014-08-15 NOTE — Telephone Encounter (Signed)
Spoke w/ pt.  Advised her of Dr. Jari SportsmanArida's recommendation. She verbalizes understanding and will call back is sx do not resolve.

## 2014-08-15 NOTE — Telephone Encounter (Signed)
The edema is more likely to be due to increased dose of amlodipine. I recommend decreasing the dose back to 5 mg once daily. Continue carvedilol. She should try to elevate her legs frequently during the day at least 3 times for 15 minutes.

## 2014-08-27 ENCOUNTER — Other Ambulatory Visit: Payer: Self-pay | Admitting: Family Medicine

## 2014-08-27 ENCOUNTER — Telehealth: Payer: Self-pay | Admitting: *Deleted

## 2014-08-27 NOTE — Telephone Encounter (Signed)
Last f/u appt 10/2013 

## 2014-08-27 NOTE — Telephone Encounter (Signed)
Rx called in to requested pharmacy 

## 2014-08-27 NOTE — Telephone Encounter (Signed)
S/w pt who states she continues to have lower extremity edema. Takes norvasc 5mg  qday. States she thinks swelling is from coreg thus she did not take it last night or this morning. Does not have scales so she does not know how much weight she has gained but states her legs and ankles are swelling and it hurts to walk. States she has been eating canned soups and vegetables, sandwich meat, bacon about 2 times per week, and occasionally uses table salt. Consumes 3 cans of soda per day.  Discussed alternatives to food she has been eating and educated her regarding low sodium/heart healthy diet, limiting fluid intake, and elevating feet. States BP for the past two weeks has been 133-142/70-79.  Forward to [employer]

## 2014-08-27 NOTE — Telephone Encounter (Signed)
Left message on machine for patient to contact the office.   

## 2014-08-27 NOTE — Telephone Encounter (Signed)
S/w pt regarding Dr. Jari SportsmanArida's recommendations. States she will stop coreg and monitor BP. Will contact PCP if systolic >140 Pt verbalized understanding with no further questions.

## 2014-08-27 NOTE — Telephone Encounter (Signed)
I think it is hard to convince her otherwise. This is the second call about Carvedilol. Discontinue carvedilol for now and monitor blood pressure. If systolic blood pressure runs above 140 systolic, she should address that with her primary care physician.

## 2014-08-27 NOTE — Telephone Encounter (Signed)
Pt c/o swelling: STAT is pt has developed SOB within 24 hours  1. How long have you been experiencing swelling? For a few weeks   2. Where is the swelling located? Legs and ankles  3.  Are you currently taking a "fluid pill"? Yes   4.  Are you currently SOB? no  5.  Have you traveled recently? No  Thinks it was the change in medications, is not sure what to do.

## 2014-09-05 ENCOUNTER — Other Ambulatory Visit: Payer: Self-pay | Admitting: Cardiovascular Disease

## 2014-09-06 ENCOUNTER — Other Ambulatory Visit: Payer: Self-pay | Admitting: Family Medicine

## 2014-10-06 ENCOUNTER — Other Ambulatory Visit: Payer: Self-pay | Admitting: Cardiovascular Disease

## 2014-10-09 ENCOUNTER — Telehealth: Payer: Self-pay

## 2014-10-09 NOTE — Telephone Encounter (Signed)
I called to notify patient of being due for a mammogram. Patient states that she wants to check with her insurance company first, and then she will schedule one at [facility].

## 2014-10-22 ENCOUNTER — Telehealth: Payer: Self-pay | Admitting: Family Medicine

## 2014-10-22 NOTE — Telephone Encounter (Signed)
[facility] Day - Client TELEPHONE ADVICE RECORD [facility]  Patient Name: Sara Roy  DOB: 10-01-1960    Initial Comment Caller states she thinks she has a sinus infection. Wants to know what otc med she can use.   Nurse Assessment  Nurse: Laural Benes, RN, Dondra Spry Date/Time [name] Time): 10/22/2014 12:08:00 PM  Confirm and document reason for call. If symptomatic, describe symptoms. ---[name] is having sinus pain and pressure no discharge stuffy onset Sunday  Has the patient traveled out of the country within the last 30 days? ---No  Does the patient require triage? ---Yes  Related visit to physician within the last 2 weeks? ---No  Does the PT have any chronic conditions? (i.e. diabetes, asthma, etc.) ---No     Guidelines    Guideline Title Affirmed Question Affirmed Notes  Sinus Pain or Congestion [1] Sinus congestion as part of a cold AND [2] present < 10 days (all triage questions negative)    Final Disposition User   [facility] [place], RN, Dondra Spry    Comments  patient does not have copay for MD visit nor medications --- wanted OTC recommendations Sudafed for people with high blood pressure is one recommended and strongly encouraged her to call office if she gets worse.   Disagree/Comply: Comply

## 2014-10-24 ENCOUNTER — Other Ambulatory Visit: Payer: Self-pay | Admitting: Family Medicine

## 2014-10-24 NOTE — Telephone Encounter (Signed)
Medication phoned to [facility] as instructed.

## 2014-10-24 NOTE — Telephone Encounter (Signed)
Electronic refill request, pt hasn't had a recent f/u or CPE last OV was an acute appt on 06/17/14, last refilled on 08/27/14 #90 with 0 refills, please advise

## 2014-10-25 ENCOUNTER — Ambulatory Visit: Payer: 59 | Admitting: Cardiovascular Disease

## 2014-11-15 ENCOUNTER — Ambulatory Visit: Payer: 59 | Admitting: Cardiovascular Disease

## 2014-11-20 ENCOUNTER — Other Ambulatory Visit: Payer: Self-pay

## 2014-11-20 MED ORDER — FLUOCINONIDE-E 0.05 % EX CREA: 1.0000 "application " | TOPICAL_CREAM | CUTANEOUS | Status: DC | PRN

## 2014-11-20 NOTE — Telephone Encounter (Signed)
Pt left v/m requesting refill fluocinonide cream to [facility] [address] for psoriasis. Spoke with pt and offered appt to be seen pt said the spots of psoriasis are very small and do not need appt to be seen; using the cream keeps them small. No recent CPX or F/u appt. Pt last seen for acute visit on 06/17/14. Fluocinonide cream last filled 05/17/2012.Please advise.

## 2014-12-06 ENCOUNTER — Encounter: Payer: Self-pay | Admitting: Cardiovascular Disease

## 2014-12-06 ENCOUNTER — Ambulatory Visit (INDEPENDENT_AMBULATORY_CARE_PROVIDER_SITE_OTHER): Payer: 59 | Admitting: Cardiovascular Disease

## 2014-12-06 VITALS — BP 150/72 | HR 83 | Ht 67.0 in | Wt 214.0 lb

## 2014-12-06 DIAGNOSIS — I739 Peripheral vascular disease, unspecified: Secondary | ICD-10-CM | POA: Diagnosis not present

## 2014-12-06 DIAGNOSIS — I10 Essential (primary) hypertension: Secondary | ICD-10-CM

## 2014-12-06 DIAGNOSIS — Z720 Tobacco use: Secondary | ICD-10-CM | POA: Diagnosis not present

## 2014-12-06 NOTE — Assessment & Plan Note (Signed)
Unfortunately, she continues to smoke one pack per day. I again discussed with her the importance of smoking cessation but she does not seem to be able to quit at the present time.

## 2014-12-06 NOTE — Assessment & Plan Note (Signed)
She has no recurrent claudication. She is due for an aortoiliac duplex in December. However, she reports that her insurance company did not cover the most recent one and she is concerned about the finances and wants to hold off at the present time. Continue to monitor clinically.

## 2014-12-06 NOTE — Progress Notes (Signed)
Primary care physician: Dr. Dayton MartesAron  HPI  This is a 54 year old female who is here today for a follow up visit regarding peripheral arterial disease. She has no previous cardiac history. She has chronic medical conditions that include hypertension, hyperlipidemia and prolonged tobacco use. She smokes one half pack per day and has been doing so for more than 40 years. She was seen last year for severe bilateral leg and back claudication.  Noninvasive evaluation showed moderately to severely reduced ABI bilaterally with evidence of severe inflow disease. I proceeded with angiography on 10/10/13 which showed :   1. 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  2. Successful bilateral kissing stent placement to both common iliac arteries extending into the distal aorta.  Claudication resolved since then.  During last visit, I started her on small dose carvedilol due to elevated blood pressure. She reported increased leg edema and thus the medication was discontinued. She reports that the blood pressure has been controlled at home although it is elevated today. She continues to smoke one pack per day. She denies claudication.   Allergies  Allergen Reactions  . Lisinopril     Rash   . Penicillins Hives  . Losartan Rash     Current Outpatient Prescriptions on File Prior to Visit  Medication Sig Dispense Refill  . acetaminophen (TYLENOL) 325 MG tablet Take 2 tablets (650 mg total) by mouth every 4 (four) hours as needed for headache or mild pain. Limit ibuprofen while on aspirin and Plavix, to avoid stomach upset.    Marland Kitchen. ALPRAZolam (XANAX) 0.25 MG tablet TAKE 1 TABLET BY MOUTH THREE TIMES DAILY AS NEEDED FOR ANXIETY 90 tablet 0  . amLODipine (NORVASC) 5 MG tablet TAKE 1 TABLET BY MOUTH EVERY DAY 90 tablet 0  . aspirin EC 81 MG tablet Take 1 tablet (81 mg total) by mouth daily. 90 tablet 3  . atorvastatin (LIPITOR) 40 MG tablet TAKE 1 TABLET BY MOUTH  EVERY DAY 90 tablet 3  . clopidogrel (PLAVIX) 75 MG tablet TAKE 1 TABLET BY MOUTH EVERY DAY 30 tablet 3  . fluocinonide-emollient (LIDEX-E) 0.05 % cream Apply 1 application topically as needed (for rash). 30 g 0  . hydrochlorothiazide (HYDRODIURIL) 25 MG tablet TAKE 1 TABLET BY MOUTH EVERY DAY 30 tablet 5  . potassium chloride (K-DUR,KLOR-CON) 10 MEQ tablet TAKE 1 TABLET BY MOUTH EVERY DAY 30 tablet 3   No current facility-administered medications on file prior to visit.     Past Medical History  Diagnosis Date  . Anxiety   . Family history of early CAD   . Tobacco abuse   . Hypertension   . Hyperlipidemia   . Peripheral vascular disease [facility](HCC)      Past Surgical History  Procedure Laterality Date  . Cholecystectomy    . Abdominal hysterectomy    . Skin cancer excision    . Lower extremity angiogram Bilateral 10/10/2013    ILIAC   BILATERAL   BY DR ARDA  . Abdominal aortagram N/A 10/10/2013    Procedure: ABDOMINAL Ronny FlurryAORTAGRAM;  Surgeon: Iran OuchMuhammad A Rishav Rockefeller, MD;  [facility];  Service: Cardiovascular;  Laterality: N/A;     Family History  Problem Relation Age of Onset  . Heart disease Father      Social History   Social History  . Marital Status: Married    Spouse Name: N/A  . Number of Children: N/A  . Years of Education: N/A  Occupational History  . Not on file.   Social History Main Topics  . Smoking status: Current Every Day Smoker -- 1.50 packs/day for 40 years    Types: Cigarettes  . Smokeless tobacco: Never Used  . Alcohol Use: No  . Drug Use: Yes    Special: Marijuana  . Sexual Activity: Not on file   Other Topics Concern  . Not on file   Social History Narrative     ROS A 10 point review of system was performed. It is negative other than that mentioned in the history of present illness.   PHYSICAL EXAM   BP 150/72 mmHg  Pulse 83  Ht  (1.702 m)  Wt 214 lb (97.07 kg)  BMI 33.51 kg/m2 Constitutional: She is oriented to person,  place, and time. She appears well-developed and well-nourished. No distress.  HENT: No nasal discharge.  Head: Normocephalic and atraumatic.  Eyes: Pupils are equal and round. No discharge.  Neck: Normal range of motion. Neck supple. No JVD present. No thyromegaly present.  Cardiovascular: Normal rate, regular rhythm, normal heart sounds. Exam reveals no gallop and no friction rub. There is a 2/6 systolic ejection murmur in the aortic area  Pulmonary/Chest: Effort normal and breath sounds normal. No stridor. No respiratory distress. She has no wheezes. She has no rales. She exhibits no tenderness.  Abdominal: Soft. Bowel sounds are normal. She exhibits no distension. There is no tenderness. There is no rebound and no guarding.  Musculoskeletal: Normal range of motion. She exhibits mild edema and no tenderness.  Neurological: She is alert and oriented to person, place, and time. Coordination normal.  Skin: Skin is warm and dry. No rash noted. She is not diaphoretic. No erythema. No pallor.  Psychiatric: She has a normal mood and affect. Her behavior is normal. Judgment and thought content normal.  Vascular: Femoral pulses are normal bilaterally. Radial pulses are normal. Distal pulses are palpable.      ASSESSMENT AND PLAN

## 2014-12-06 NOTE — Assessment & Plan Note (Signed)
Blood pressure is elevated today but she reports normal readings at home. She did not tolerate carvedilol. She is currently on hydrochlorothiazide and amlodipine.

## 2014-12-06 NOTE — Patient Instructions (Signed)
Medication Instructions: Continue same medications.   Labwork: None.   Procedures/Testing: None.   Follow-Up: 6 months with Dr. Sharetta Ricchio.   Any Additional Special Instructions Will Be Listed Below (If Applicable).   

## 2015-01-08 ENCOUNTER — Other Ambulatory Visit: Payer: Self-pay | Admitting: Family Medicine

## 2015-01-09 NOTE — Telephone Encounter (Signed)
Is she needing to schedule a f/u since her anxiety has not been addressed in over 8060yrs?

## 2015-01-09 NOTE — Telephone Encounter (Signed)
Yes please schedule 30 min follow up over next few months.

## 2015-01-09 NOTE — Telephone Encounter (Signed)
rx called into requested pharmacy. Lm on pts vm and advised a f/u appt is required for any additional refills, and will need to be scheduled before the Rx called in to day, runs out

## 2015-01-09 NOTE — Telephone Encounter (Signed)
Pt has not had f/u appt in 3+yrs, acute only. Does not take daily as last Rx 10/2014. pls advise

## 2015-01-26 ENCOUNTER — Other Ambulatory Visit: Payer: Self-pay | Admitting: Cardiovascular Disease

## 2015-02-13 ENCOUNTER — Other Ambulatory Visit: Payer: Self-pay | Admitting: Family Medicine

## 2015-02-20 ENCOUNTER — Other Ambulatory Visit: Payer: Self-pay | Admitting: Family Medicine

## 2015-03-10 ENCOUNTER — Ambulatory Visit (INDEPENDENT_AMBULATORY_CARE_PROVIDER_SITE_OTHER): Payer: 59 | Admitting: Family Medicine

## 2015-03-10 ENCOUNTER — Encounter: Payer: Self-pay | Admitting: Family Medicine

## 2015-03-10 ENCOUNTER — Encounter: Payer: Self-pay | Admitting: *Deleted

## 2015-03-10 VITALS — BP 134/74 | HR 78 | Temp 98.3°F | Wt 215.2 lb

## 2015-03-10 DIAGNOSIS — F419 Anxiety disorder, unspecified: Secondary | ICD-10-CM | POA: Diagnosis not present

## 2015-03-10 DIAGNOSIS — E785 Hyperlipidemia, unspecified: Secondary | ICD-10-CM | POA: Diagnosis not present

## 2015-03-10 DIAGNOSIS — Z8349 Family history of other endocrine, nutritional and metabolic diseases: Secondary | ICD-10-CM | POA: Insufficient documentation

## 2015-03-10 DIAGNOSIS — Z720 Tobacco use: Secondary | ICD-10-CM

## 2015-03-10 DIAGNOSIS — I10 Essential (primary) hypertension: Secondary | ICD-10-CM

## 2015-03-10 DIAGNOSIS — I739 Peripheral vascular disease, unspecified: Secondary | ICD-10-CM | POA: Diagnosis not present

## 2015-03-10 LAB — COMPREHENSIVE METABOLIC PANEL
ALT: 12 U/L (ref 0–35)
AST: 14 U/L (ref 0–37)
Albumin: 4 g/dL (ref 3.5–5.2)
Alkaline Phosphatase: 113 U/L (ref 39–117)
BUN: 16 mg/dL (ref 6–23)
CHLORIDE: 102 meq/L (ref 96–112)
CO2: 29 meq/L (ref 19–32)
CREATININE: 0.74 mg/dL (ref 0.40–1.20)
Calcium: 9.3 mg/dL (ref 8.4–10.5)
GFR: 86.11 mL/min (ref >60.00)
Glucose, Bld: 112 mg/dL — ABNORMAL HIGH (ref 70–99)
Potassium: 3.7 mEq/L (ref 3.5–5.1)
Sodium: 140 mEq/L (ref 135–145)
Total Bilirubin: 0.2 mg/dL (ref 0.2–1.2)
Total Protein: 7.2 g/dL (ref 6.0–8.3)

## 2015-03-10 LAB — LIPID PANEL
CHOL/HDL RATIO: 4
Cholesterol: 132 mg/dL (ref 0–200)
HDL: 36 mg/dL — AB (ref >39.00)
LDL CALC: 58 mg/dL (ref 0–99)
NonHDL: 95.82
TRIGLYCERIDES: 187 mg/dL — AB (ref 0.0–149.0)
VLDL: 37.4 mg/dL (ref 0.0–40.0)

## 2015-03-10 MED ORDER — POTASSIUM CHLORIDE CRYS ER 10 MEQ PO TBCR: 10.0000 meq | EXTENDED_RELEASE_TABLET | Freq: Every day | ORAL | Status: DC

## 2015-03-10 NOTE — Assessment & Plan Note (Signed)
No further symptoms of claudication. Continue ASA, plavix. S/p angio/stenting. Followed by cardiology.

## 2015-03-10 NOTE — Assessment & Plan Note (Signed)
Improved control today. Continue current rxs.

## 2015-03-10 NOTE — Patient Instructions (Signed)
Great to see you. We will call you with your lab results and you can view them online.  

## 2015-03-10 NOTE — Assessment & Plan Note (Signed)
Unfortunately still not ready to quit smoking. Smoking cessation instruction/counseling given:  counseled patient on the dangers of tobacco use, advised patient to stop smoking, and reviewed strategies to maximize success

## 2015-03-10 NOTE — Progress Notes (Signed)
Subjective:   Patient ID: Sara Roy, female    DOB: [birth year], 55 y.o.   MRN: [id number]  Cimberly Stoffel Fawver is a pleasant 55 y.o. year old female who presents to clinic today with Follow-up and Medication Refill  on 03/10/2015  HPI: PAD- has been followed by Dr. Kirke Corin after ABIs (ordered due to claudication) showed significant disease.  Had stenting of bilateral common iliac arteries.  Last saw him on 12/06/14- note reviewed. Monitoring PAD clinically as she cannot afford follow up aortoiliac duplex. No changes were made to her rxs.  She is taking plavix and Liptior.  Lab Results  Component Value Date   CHOL 139 10/26/2013   HDL 41 10/26/2013   LDLCALC 76 10/26/2013   LDLDIRECT 162.7 08/18/2011   TRIG 112 10/26/2013   CHOLHDL 3.4 10/26/2013   Lab Results  Component Value Date   ALT 29 10/26/2013   AST 19 10/26/2013   ALKPHOS 119* 10/26/2013   BILITOT 0.4 10/26/2013   HTN- taking HCTZ 25 mg daily and norvasc. Could not tolerate coreg.  Taking potassium as well.  Lab Results  Component Value Date   NA 140 10/02/2013   K 4.4 10/02/2013   CL 96* 10/02/2013   CO2 24 10/02/2013   Lab Results  Component Value Date   CREATININE 0.77 10/02/2013     Anxiety/insomnia- Takes very occasional xanax.  Mainly for insomnia.  Tries not to take it during the day . Feels less anxious that she did months before. Denies feeling depressed.  Sister just diagnosed with hemochromatosis.  She would like to be screened for this as well.  Lab Results  Component Value Date   WBC 12.1* 10/02/2013   HGB 13.0 10/02/2013   HCT 40.1 10/02/2013   MCV 82 10/02/2013   PLT 374 10/02/2013     Current Outpatient Prescriptions on File Prior to Visit  Medication Sig Dispense Refill  . acetaminophen (TYLENOL) 325 MG tablet Take 2 tablets (650 mg total) by mouth every 4 (four) hours as needed for headache or mild pain. Limit ibuprofen while on aspirin and Plavix, to avoid stomach upset.    Marland Kitchen  ALPRAZolam (XANAX) 0.25 MG tablet TAKE 1 TABLET BY MOUTH THREE TIMES DAILY AS NEEDED FOR ANXIETY 90 tablet 0  . amLODipine (NORVASC) 5 MG tablet TAKE 1 TABLET BY MOUTH EVERY DAY 90 tablet 0  . aspirin EC 81 MG tablet Take 1 tablet (81 mg total) by mouth daily. 90 tablet 3  . atorvastatin (LIPITOR) 40 MG tablet TAKE 1 TABLET BY MOUTH EVERY DAY 90 tablet 3  . clopidogrel (PLAVIX) 75 MG tablet TAKE 1 TABLET BY MOUTH EVERY DAY 30 tablet 3  . fluocinonide-emollient (LIDEX-E) 0.05 % cream Apply 1 application topically as needed (for rash). 30 g 0  . hydrochlorothiazide (HYDRODIURIL) 25 MG tablet TAKE 1 TABLET BY MOUTH EVERY DAY 30 tablet 4  . potassium chloride (K-DUR,KLOR-CON) 10 MEQ tablet TAKE 1 TABLET BY MOUTH EVERY DAY 30 tablet 3   No current facility-administered medications on file prior to visit.    Allergies  Allergen Reactions  . Lisinopril     Rash   . Penicillins Hives  . Losartan Rash    Past Medical History  Diagnosis Date  . Anxiety   . Family history of early CAD   . Tobacco abuse   . Hypertension   . Hyperlipidemia   . Peripheral vascular disease [facility])     Past Surgical History  Procedure Laterality Date  .  Cholecystectomy    . Abdominal hysterectomy    . Skin cancer excision    . Lower extremity angiogram Bilateral 10/10/2013    ILIAC   BILATERAL   BY DR ARDA  . Abdominal aortagram N/A 10/10/2013    Procedure: ABDOMINAL Ronny Flurry;  Surgeon: Iran Ouch, MD;  Location: [facility] CATH LAB;  Service: Cardiovascular;  Laterality: N/A;    Family History  Problem Relation Age of Onset  . Heart disease Father     Social History   Social History  . Marital Status: Married    Spouse Name: N/A  . Number of Children: N/A  . Years of Education: N/A   Occupational History  . Not on file.   Social History Main Topics  . Smoking status: Current Every Day Smoker -- 1.50 packs/day for 40 years    Types: Cigarettes  . Smokeless tobacco: Never Used  . Alcohol Use:  No  . Drug Use: Yes    Special: Marijuana  . Sexual Activity: Not on file   Other Topics Concern  . Not on file   Social History Narrative   The PMH, PSH, Social History, Family History, Medications, and allergies have been reviewed in [facility], and have been updated if relevant.     Review of Systems  Constitutional: Positive for fatigue.  HENT: Negative.   Respiratory: Negative.   Cardiovascular: Negative.   Gastrointestinal: Negative.   Endocrine: Negative.   Genitourinary: Negative.   Musculoskeletal: Negative.   Skin: Negative.   Allergic/Immunologic: Negative.   Neurological: Negative.   Hematological: Negative.   Psychiatric/Behavioral: Negative.   All other systems reviewed and are negative.    Objective:    BP 134/74 mmHg  Pulse 78  Temp(Src) 98.3 F (36.8 C) (Oral)  Wt 215 lb 4 oz (97.637 kg)  SpO2 96%   Physical Exam  Constitutional: She is oriented to person, place, and time. She appears well-developed and well-nourished. No distress.  HENT:  Head: Normocephalic.  Eyes: Conjunctivae are normal.  Cardiovascular: Normal rate and regular rhythm.   Pulmonary/Chest: Effort normal and breath sounds normal.  Musculoskeletal: Normal range of motion.  Neurological: She is alert and oriented to person, place, and time. No cranial nerve deficit.  Skin: Skin is warm and dry.  Psychiatric: She has a normal mood and affect. Her behavior is normal. Judgment and thought content normal.  Nursing note and vitals reviewed.         Assessment & Plan:   Hyperlipidemia - Plan: Comprehensive metabolic panel, Lipid panel  Essential hypertension  Anxiety  PAD (peripheral artery disease) (HCC)  Tobacco use No Follow-up on file.

## 2015-03-10 NOTE — Assessment & Plan Note (Signed)
Lab ordered today 

## 2015-03-10 NOTE — Assessment & Plan Note (Signed)
Continue current dose of lipitor. Due for labs today. Orders Placed This Encounter  Procedures  . Comprehensive metabolic panel  . Lipid panel  . Hemochromatosis DNA-PCR(c282y,h63d)

## 2015-03-14 LAB — HEMOCHROMATOSIS DNA-PCR(C282Y,H63D)

## 2015-03-19 ENCOUNTER — Other Ambulatory Visit: Payer: Self-pay | Admitting: Family Medicine

## 2015-03-19 NOTE — Telephone Encounter (Signed)
Last f/u 02/2015 

## 2015-03-19 NOTE — Telephone Encounter (Signed)
Rx called in to requested pharmacy 

## 2015-03-31 ENCOUNTER — Encounter: Payer: Self-pay | Admitting: Family Medicine

## 2015-04-08 ENCOUNTER — Other Ambulatory Visit: Payer: Self-pay | Admitting: Family Medicine

## 2015-05-21 ENCOUNTER — Other Ambulatory Visit: Payer: Self-pay | Admitting: Family Medicine

## 2015-05-27 ENCOUNTER — Other Ambulatory Visit: Payer: Self-pay | Admitting: Cardiovascular Disease

## 2015-05-28 ENCOUNTER — Other Ambulatory Visit: Payer: Self-pay | Admitting: Family Medicine

## 2015-05-28 NOTE — Telephone Encounter (Signed)
Rx called in to requested pharmacy 

## 2015-05-28 NOTE — Telephone Encounter (Signed)
Last f/u 02/2015 

## 2015-06-10 ENCOUNTER — Ambulatory Visit: Payer: 59 | Admitting: Cardiovascular Disease

## 2015-07-01 ENCOUNTER — Telehealth: Payer: Self-pay | Admitting: Cardiovascular Disease

## 2015-07-01 NOTE — Telephone Encounter (Signed)
Pt due for aortoiliac duplex Dec 2016. Per MD notes at 12/06/14 OV: "She has no recurrent claudication. She is due for an aortoiliac duplex in December. However, she reports that her insurance company did not cover the most recent one and she is concerned about the finances and wants to hold off at the present time. Continue to monitor clinically."  Pt calling regarding having this before June 5 f/u appt.  Forward to MD to advise.

## 2015-07-01 NOTE — Telephone Encounter (Signed)
Pt calling stating she does a routine test on her legs And is not sure if Dr Kirke CorinArida would like for her to do it again.  If so she would prefer to do it before she comes to see us  Please advise.

## 2015-07-01 NOTE — Telephone Encounter (Signed)
Better to do before her visit.

## 2015-07-02 ENCOUNTER — Other Ambulatory Visit: Payer: Self-pay

## 2015-07-02 DIAGNOSIS — I739 Peripheral vascular disease, unspecified: Secondary | ICD-10-CM

## 2015-07-02 NOTE — Telephone Encounter (Signed)
Pt called back. Upcoming appointment times reviewed with patient by [place].

## 2015-07-02 NOTE — Telephone Encounter (Signed)
Aortoiliac duplex June 9, 8:30am F/u w/Arida June 12, 1:15pm  Left message on machine for patient to contact the office.

## 2015-07-14 ENCOUNTER — Ambulatory Visit: Payer: 59 | Admitting: Cardiovascular Disease

## 2015-07-17 ENCOUNTER — Other Ambulatory Visit: Payer: Self-pay | Admitting: Cardiovascular Disease

## 2015-07-17 DIAGNOSIS — I779 Disorder of arteries and arterioles, unspecified: Secondary | ICD-10-CM

## 2015-07-18 ENCOUNTER — Ambulatory Visit: Payer: Managed Care, Other (non HMO)

## 2015-07-18 DIAGNOSIS — I779 Disorder of arteries and arterioles, unspecified: Secondary | ICD-10-CM | POA: Diagnosis not present

## 2015-07-18 DIAGNOSIS — I739 Peripheral vascular disease, unspecified: Secondary | ICD-10-CM

## 2015-07-21 ENCOUNTER — Ambulatory Visit (INDEPENDENT_AMBULATORY_CARE_PROVIDER_SITE_OTHER): Payer: Managed Care, Other (non HMO) | Admitting: Cardiovascular Disease

## 2015-07-21 ENCOUNTER — Other Ambulatory Visit: Payer: Self-pay

## 2015-07-21 ENCOUNTER — Other Ambulatory Visit: Payer: Self-pay | Admitting: Family Medicine

## 2015-07-21 ENCOUNTER — Encounter: Payer: Self-pay | Admitting: Cardiovascular Disease

## 2015-07-21 VITALS — BP 152/66 | HR 82 | Ht 67.0 in | Wt 200.2 lb

## 2015-07-21 DIAGNOSIS — R011 Cardiac murmur, unspecified: Secondary | ICD-10-CM | POA: Diagnosis not present

## 2015-07-21 DIAGNOSIS — I10 Essential (primary) hypertension: Secondary | ICD-10-CM | POA: Diagnosis not present

## 2015-07-21 DIAGNOSIS — I739 Peripheral vascular disease, unspecified: Secondary | ICD-10-CM | POA: Diagnosis not present

## 2015-07-21 NOTE — Progress Notes (Signed)
Cardiology Office Note   Date:  07/21/2015   ID:  [name], DOB 04-09-1960, MRN [id number]  PCP:  Sara Mannan, MD  Cardiologist:   Sara Bears, MD   Chief Complaint  Patient presents with  . other    6 month follow up. Pt. c/o shortness of breath with exertion. Meds reviewed by the patient verbally.       History of Present Illness: Sara Roy is a 55 y.o. female who presents for  a follow up visit regarding peripheral arterial disease. She has no previous cardiac history. She has chronic medical conditions that include hypertension, hyperlipidemia and prolonged tobacco use. She smokes one half pack per day and has been doing so for more than 40 years. She is s/p Bilateral kissing stent placement to both common iliac arteries into the distal aorta in September 2015 for severe claudication. No previous cardiac history. She had an echocardiogram done in June 2016 for a heart murmur which showed normal LV systolic function with no significant valvular abnormalities. The aortic valve was not well-visualized. She has been doing well overall with no chest pain. She has chronic exertional dyspnea. She complains of bilateral leg pain at rest that actually improves with physical activities. No recurrent claudication. She continues to smoke but she is trying to cut down.   Past Medical History  Diagnosis Date  . Anxiety   . Family history of early CAD   . Tobacco abuse   . Hypertension   . Hyperlipidemia   . Peripheral vascular disease [facility])     Past Surgical History  Procedure Laterality Date  . Cholecystectomy    . Abdominal hysterectomy    . Skin cancer excision    . Lower extremity angiogram Bilateral 10/10/2013    ILIAC   BILATERAL   BY DR ARDA  . Abdominal aortagram N/A 10/10/2013    Procedure: ABDOMINAL Ronny Flurry;  Surgeon: Iran Ouch, MD;  Location: [facility] CATH LAB;  Service: Cardiovascular;  Laterality: N/A;     Current Outpatient Prescriptions  Medication  Sig Dispense Refill  . acetaminophen (TYLENOL) 325 MG tablet Take 2 tablets (650 mg total) by mouth every 4 (four) hours as needed for headache or mild pain. Limit ibuprofen while on aspirin and Plavix, to avoid stomach upset.    Marland Kitchen ALPRAZolam (XANAX) 0.25 MG tablet TAKE 1 TABLET BY MOUTH THREE TIMES DAILY AS NEEDED FOR ANXIETY 90 tablet 0  . amLODipine (NORVASC) 5 MG tablet TAKE 1 TABLET BY MOUTH EVERY DAY 90 tablet 1  . aspirin EC 81 MG tablet Take 1 tablet (81 mg total) by mouth daily. 90 tablet 3  . atorvastatin (LIPITOR) 40 MG tablet TAKE 1 TABLET BY MOUTH EVERY DAY 90 tablet 3  . fluocinonide-emollient (LIDEX-E) 0.05 % cream Apply 1 application topically as needed (for rash). 30 g 0  . hydrochlorothiazide (HYDRODIURIL) 25 MG tablet TAKE 1 TABLET BY MOUTH EVERY DAY 30 tablet 4  . potassium chloride (K-DUR,KLOR-CON) 10 MEQ tablet Take 1 tablet (10 mEq total) by mouth daily. 30 tablet 3   No current facility-administered medications for this visit.    Allergies:   Lisinopril; Penicillins; and Losartan    Social History:  The patient  reports that she has been smoking Cigarettes.  She has a 40 pack-year smoking history. She has never used smokeless tobacco. She reports that she uses illicit drugs (Marijuana). She reports that she does not drink alcohol.   Family History:  The patient's family  history includes Heart disease in her father.    ROS:  Please see the history of present illness.   Otherwise, review of systems are positive for none.   All other systems are reviewed and negative.    PHYSICAL EXAM: VS:  BP 152/66 mmHg  Pulse 82  Ht 5\' 7"  (1.702 m)  Wt 200 lb 4 oz (90.833 kg)  BMI 31.36 kg/m2 , BMI Body mass index is 31.36 kg/(m^2). GEN: Well nourished, well developed, in no acute distress HEENT: normal Neck: no JVD, carotid bruits, or masses Cardiac: RRR; no murmurs, rubs, or gallops,no edema  Respiratory:  clear to auscultation bilaterally, normal work of breathing GI:  soft, nontender, nondistended, + BS MS: no deformity or atrophy Skin: warm and dry, no rash Neuro:  Strength and sensation are intact Psych: euthymic mood, full affect   EKG:  EKG is ordered today. The ekg ordered today demonstrates normal sinus rhythm with incomplete right bundle branch block.   Recent Labs: 03/10/2015: ALT 12; BUN 16; Creatinine, Ser 0.74; Potassium 3.7; Sodium 140    Lipid Panel    Component Value Date/Time   CHOL 132 03/10/2015 1128   CHOL 139 10/26/2013 0757   TRIG 187.0* 03/10/2015 1128   HDL 36.00* 03/10/2015 1128   HDL 41 10/26/2013 0757   CHOLHDL 4 03/10/2015 1128   CHOLHDL 3.4 10/26/2013 0757   VLDL 37.4 03/10/2015 1128   LDLCALC 58 03/10/2015 1128   LDLCALC 76 10/26/2013 0757   LDLDIRECT 162.7 08/18/2011 1007      Wt Readings from Last 3 Encounters:  07/21/15 200 lb 4 oz (90.833 kg)  03/10/15 215 lb 4 oz (97.637 kg)  12/06/14 214 lb (97.07 kg)        ASSESSMENT AND PLAN:  1.  Peripheral arterial disease: Status post bilateral common iliac artery stent placement with resolution of claudication. Most recent noninvasive vascular evaluation this month showed normal ABI bilaterally, patent iliac stents with mild to moderate in-stent restenosis. I discontinued Plavix today. Continue aspirin indefinitely.  2. Essential hypertension: Blood pressure is elevated. She did not tolerate carvedilol in the past. Continue treatment with amlodipine and hydrochlorothiazide. The dose of amlodipine can be increased if needed.  3. Hyperlipidemia: Continue treatment with atorvastatin 40 mg once daily. Most recent lipid profile showed an LDL of 58.  4. Tobacco use: I discussed with her the importance of smoking cessation but she has not been able to quit in spite of previous discussions.   Disposition:   FU with me in 1 year  Signed,  Sara BearsMuhammad Kalia Vahey, MD  07/21/2015 1:36 PM    [facility]

## 2015-07-21 NOTE — Telephone Encounter (Signed)
Alprazolam called into [facility] [address] [place]. 

## 2015-07-21 NOTE — Telephone Encounter (Signed)
Last office visit 03/10/2015.  Last refilled 05/28/2015 for #90 with no refills.  Ok to refill?

## 2015-07-21 NOTE — Patient Instructions (Signed)
Medication Instructions: Stop taking Plavix.  Continue other medications.   Labwork: None.   Procedures/Testing: None.   Follow-Up:  1 year with Dr. Kirke CorinArida.   Any Additional Special Instructions Will Be Listed Below (If Applicable).     If you need a refill on your cardiac medications before your next appointment, please call your pharmacy.

## 2015-07-26 ENCOUNTER — Other Ambulatory Visit: Payer: Self-pay | Admitting: Family Medicine

## 2015-08-14 ENCOUNTER — Other Ambulatory Visit: Payer: Self-pay | Admitting: Cardiovascular Disease

## 2015-09-01 ENCOUNTER — Other Ambulatory Visit: Payer: Self-pay | Admitting: Family Medicine

## 2015-09-01 NOTE — Telephone Encounter (Signed)
Is pt to continue K+? Last lab 02/2015-normal. pls advise

## 2015-09-05 ENCOUNTER — Other Ambulatory Visit: Payer: Self-pay | Admitting: Family Medicine

## 2015-09-08 ENCOUNTER — Telehealth: Payer: Self-pay

## 2015-09-08 NOTE — Telephone Encounter (Signed)
Chart reviewed. It looks like Dr. Kirke Corin advised possibly increasing amlodipine last month- please call pt and verify how she is taking it.  She needs to follow up with cardiology since he has increased her dose.

## 2015-09-08 NOTE — Telephone Encounter (Signed)
Pt went to [facility] [address] to pick up amlodipine 5 mg and pharmacist advised pt amlodipine 5 mg was filled on auto refill but rx for amlodipine 10 mg was sent by Dr Elmer Sow office on 09/05/15. Pt said no one had told pt to increase her med so pt has not picked up either med yet and request cb with what to do. Please advise.

## 2015-09-08 NOTE — Telephone Encounter (Signed)
Spoke to pt and advised per Dr Dayton Martes. Pt will contact cardiology and verify whether she is to take 5mg  or 10mg . Pt will contact office back to confirm which should be sent to the pharmacy

## 2015-09-27 ENCOUNTER — Other Ambulatory Visit: Payer: Self-pay | Admitting: Family Medicine

## 2015-09-29 ENCOUNTER — Other Ambulatory Visit: Payer: Self-pay | Admitting: Family Medicine

## 2015-09-29 NOTE — Telephone Encounter (Signed)
Last f/u 02/2015 

## 2015-09-30 NOTE — Telephone Encounter (Signed)
Rx called in to requested pharmacy 

## 2015-10-02 ENCOUNTER — Other Ambulatory Visit: Payer: Self-pay | Admitting: Family Medicine

## 2015-10-02 ENCOUNTER — Encounter: Payer: Self-pay | Admitting: Family Medicine

## 2015-10-02 ENCOUNTER — Telehealth: Payer: Self-pay | Admitting: Family Medicine

## 2015-10-02 ENCOUNTER — Ambulatory Visit (INDEPENDENT_AMBULATORY_CARE_PROVIDER_SITE_OTHER): Payer: Managed Care, Other (non HMO) | Admitting: Family Medicine

## 2015-10-02 VITALS — BP 118/60 | HR 73 | Temp 98.3°F | Wt 203.5 lb

## 2015-10-02 DIAGNOSIS — D649 Anemia, unspecified: Secondary | ICD-10-CM

## 2015-10-02 DIAGNOSIS — E559 Vitamin D deficiency, unspecified: Secondary | ICD-10-CM | POA: Diagnosis not present

## 2015-10-02 DIAGNOSIS — R899 Unspecified abnormal finding in specimens from other organs, systems and tissues: Secondary | ICD-10-CM

## 2015-10-02 DIAGNOSIS — R591 Generalized enlarged lymph nodes: Secondary | ICD-10-CM

## 2015-10-02 DIAGNOSIS — D509 Iron deficiency anemia, unspecified: Secondary | ICD-10-CM

## 2015-10-02 DIAGNOSIS — R599 Enlarged lymph nodes, unspecified: Secondary | ICD-10-CM

## 2015-10-02 DIAGNOSIS — R5383 Other fatigue: Secondary | ICD-10-CM | POA: Diagnosis not present

## 2015-10-02 LAB — CBC WITH DIFFERENTIAL/PLATELET
BASOS ABS: 0.1 10*3/uL (ref 0.0–0.1)
Basophils Relative: 0.6 % (ref 0.0–3.0)
Eosinophils Absolute: 0.5 10*3/uL (ref 0.0–0.7)
Eosinophils Relative: 3.7 % (ref 0.0–5.0)
HCT: 26.7 % — ABNORMAL LOW (ref 36.0–46.0)
Hemoglobin: 8.1 g/dL — ABNORMAL LOW (ref 12.0–15.0)
LYMPHS ABS: 3.6 10*3/uL (ref 0.7–4.0)
Lymphocytes Relative: 24.2 % (ref 12.0–46.0)
MCHC: 30.4 g/dL (ref 30.0–36.0)
MCV: 60.2 fl — ABNORMAL LOW (ref 78.0–100.0)
MONO ABS: 0.9 10*3/uL (ref 0.1–1.0)
Monocytes Relative: 6.1 % (ref 3.0–12.0)
NEUTROS PCT: 65.4 % (ref 43.0–77.0)
Neutro Abs: 9.8 10*3/uL — ABNORMAL HIGH (ref 1.4–7.7)
Platelets: 527 10*3/uL — ABNORMAL HIGH (ref 150.0–400.0)
RBC: 4.44 Mil/uL (ref 3.87–5.11)
RDW: 21.1 % — ABNORMAL HIGH (ref 11.5–15.5)
WBC: 14.9 10*3/uL — ABNORMAL HIGH (ref 4.0–10.5)

## 2015-10-02 LAB — VITAMIN B12: Vitamin B-12: 314 pg/mL (ref 211–911)

## 2015-10-02 LAB — FERRITIN: FERRITIN: 2 ng/mL — AB (ref 10.0–291.0)

## 2015-10-02 MED ORDER — FERROUS SULFATE 325 (65 FE) MG PO TBEC: 325.0000 mg | DELAYED_RELEASE_TABLET | Freq: Three times a day (TID) | ORAL | 3 refills | Status: DC

## 2015-10-02 MED ORDER — VITAMIN D (ERGOCALCIFEROL) 1.25 MG (50000 UNIT) PO CAPS: 50000.0000 [IU] | ORAL_CAPSULE | ORAL | 0 refills | Status: DC

## 2015-10-02 NOTE — Telephone Encounter (Signed)
Patient called to get her lab results.  I let her know lab results aren't in the computer.  She asked to be called when lab results are in.

## 2015-10-02 NOTE — Assessment & Plan Note (Signed)
New- eRx sent for high dose Vit D weekly replacement.

## 2015-10-02 NOTE — Patient Instructions (Signed)
Great to see you. I will call you with your lab results.  We are starting weekly vit D.

## 2015-10-02 NOTE — Assessment & Plan Note (Addendum)
New anemia. Will recheck labs today- stat.  Advised pt that she may need to go to ER for blood transfusion based on lab results. The patient indicates understanding of these issues and agrees with the plan.  Refer to GI for colonoscopy. Orders Placed This Encounter  Procedures  . CBC with Differential/Platelet  . Save smear  . Ferritin  . Vitamin B12  . Ambulatory referral to Gastroenterology

## 2015-10-02 NOTE — Assessment & Plan Note (Signed)
Likely multifactorial- low Vit  D and anemia.

## 2015-10-02 NOTE — Progress Notes (Signed)
ferrou

## 2015-10-02 NOTE — Progress Notes (Signed)
Subjective:   Patient ID: Sara Roy, female    DOB: 10/21/1960, 55 y.o.   MRN: [id number]  Sara Roy is a pleasant 55 y.o. year old female who presents to clinic today with Ear Pain (right) and Sore Throat  on 10/02/2015  HPI:   originally here for sore throat but has much more pressing issues.   [place] 3 [place] ago for ear pain.  Given levaquin and this has improved.  But while she was there, labs were drawn which she brings in today.  Vit D 11  Hemoglobin 7.9.  She has been fatigued and achy.  Denies any blood in her stool. Remote h/o hysterectomy. Has never had a colonoscopy.  Lab Results  Component Value Date   WBC 12.1 (H) 10/02/2013   HGB 13.0 10/02/2013   HCT 40.1 10/02/2013   MCV 82 10/02/2013   PLT 374 10/02/2013    Current Outpatient Prescriptions on File Prior to Visit  Medication Sig Dispense Refill  . acetaminophen (TYLENOL) 325 MG tablet Take 2 tablets (650 mg total) by mouth every 4 (four) hours as needed for headache or mild pain. Limit ibuprofen while on aspirin and Plavix, to avoid stomach upset.    Marland Kitchen. ALPRAZolam (XANAX) 0.25 MG tablet TAKE 1 TABLET BY MOUTH THREE TIMES DAILY AS NEEDED 90 tablet 0  . amLODipine (NORVASC) 10 MG tablet TAKE 1 TABLET(10 MG) BY MOUTH DAILY 90 tablet 1  . aspirin EC 81 MG tablet Take 1 tablet (81 mg total) by mouth daily. 90 tablet 3  . atorvastatin (LIPITOR) 40 MG tablet TAKE 1 TABLET BY MOUTH EVERY DAY 90 tablet 3  . fluocinonide-emollient (LIDEX-E) 0.05 % cream Apply 1 application topically as needed (for rash). 30 g 0  . hydrochlorothiazide (HYDRODIURIL) 25 MG tablet TAKE 1 TABLET BY MOUTH EVERY DAY 30 tablet 6  . potassium chloride (K-DUR,KLOR-CON) 10 MEQ tablet TAKE 1 TABLET(10 MEQ) BY MOUTH DAILY 30 tablet 0   No current facility-administered medications on file prior to visit.     Allergies  Allergen Reactions  . Lisinopril     Rash   . Penicillins Hives  . Losartan Rash     Past Medical History:  Diagnosis Date  . Anxiety   . Family history of early CAD   . Hyperlipidemia   . Hypertension   . Peripheral vascular disease (HCC)   . Tobacco abuse     Past Surgical History:  Procedure Laterality Date  . ABDOMINAL AORTAGRAM N/A 10/10/2013   Procedure: ABDOMINAL Ronny FlurryAORTAGRAM;  Surgeon: Iran OuchMuhammad A Arida, MD;  Location: [facility] CATH LAB;  Service: Cardiovascular;  Laterality: N/A;  . ABDOMINAL HYSTERECTOMY    . CHOLECYSTECTOMY    . LOWER EXTREMITY ANGIOGRAM Bilateral 10/10/2013   ILIAC   BILATERAL   BY DR ARDA  . SKIN CANCER EXCISION      Family History  Problem Relation Age of Onset  . Heart disease Father     Social History   Social History  . Marital status: Married    Spouse name: N/A  . Number of children: N/A  . Years of education: N/A   Occupational History  . Not on file.   Social History Main Topics  . Smoking status: Current Every Day Smoker    Packs/day: 1.00    Years: 40.00    Types: Cigarettes  . Smokeless tobacco: Never Used  . Alcohol use No  . Drug use:     Types:  Marijuana  . Sexual activity: Not on file   Other Topics Concern  . Not on file   Social History Narrative  . No narrative on file   The PMH, PSH, Social History, Family History, Medications, and allergies [facility], and have been updated if relevant.   Review of Systems  Constitutional: Positive for fatigue. Negative for fever and unexpected weight change.  HENT: Positive for ear pain and sore throat.   Respiratory: Negative.   Cardiovascular: Negative.   Gastrointestinal: Negative.   Endocrine: Negative.   Genitourinary: Negative.   Musculoskeletal: Positive for myalgias.  Allergic/Immunologic: Negative.   Neurological: Negative.   Hematological: Negative.   Psychiatric/Behavioral: Negative.   All other systems reviewed and are negative.      Objective:    BP 118/60   Pulse 73   Temp 98.3 F (36.8 C) (Oral)   Wt 203 lb 8 oz  (92.3 kg)   SpO2 97%   BMI 31.87 kg/m    Physical Exam  Constitutional: She is oriented to person, place, and time. She appears well-developed and well-nourished. No distress.  HENT:  Head: Normocephalic.  Eyes: Conjunctivae are normal.  Neck: Neck supple.  Cardiovascular: Normal rate.   Pulmonary/Chest: Effort normal.  Musculoskeletal: Normal range of motion.  Lymphadenopathy:    She has cervical adenopathy.  Neurological: She is alert and oriented to person, place, and time. No cranial nerve deficit.  Skin: She is not diaphoretic.  Psychiatric: She has a normal mood and affect. Her behavior is normal. Judgment and thought content normal.  Nursing note and vitals reviewed.         Assessment & Plan:   Abnormal laboratory test - Plan: CBC with Differential/Platelet, Save smear, Ferritin, Vitamin B12  Anemia, unspecified anemia type - Plan: CBC with Differential/Platelet, Save smear, Ferritin, Vitamin B12, Ambulatory referral to Gastroenterology  Vitamin D deficiency  Lymphadenopathy of head and neck No Follow-up on file.

## 2015-10-02 NOTE — Progress Notes (Signed)
Pre visit review using our clinic review tool, if applicable. No additional management support is needed unless otherwise documented below in the visit note. 

## 2015-10-03 ENCOUNTER — Encounter: Payer: Self-pay | Admitting: Internal Medicine

## 2015-10-06 ENCOUNTER — Other Ambulatory Visit: Payer: Self-pay

## 2015-10-15 NOTE — Discharge Instructions (Signed)

## 2015-10-16 ENCOUNTER — Encounter
Admission: RE | Disposition: A | Discharge status: Home / Self Care | Payer: Self-pay | Source: Ambulatory Visit | Attending: Gastroenterology

## 2015-10-16 ENCOUNTER — Ambulatory Visit: Payer: Managed Care, Other (non HMO) | Admitting: Student in an Organized Health Care Education/Training Program

## 2015-10-16 ENCOUNTER — Ambulatory Visit
Admission: RE | Admit: 2015-10-16 | Discharge: 2015-10-16 | Disposition: A | Discharge status: Home / Self Care | Payer: Managed Care, Other (non HMO) | Source: Ambulatory Visit | Attending: Gastroenterology | Admitting: Gastroenterology

## 2015-10-16 DIAGNOSIS — Z79899 Other long term (current) drug therapy: Secondary | ICD-10-CM | POA: Insufficient documentation

## 2015-10-16 DIAGNOSIS — Z7982 Long term (current) use of aspirin: Secondary | ICD-10-CM | POA: Diagnosis not present

## 2015-10-16 DIAGNOSIS — K5521 Angiodysplasia of colon with hemorrhage: Secondary | ICD-10-CM

## 2015-10-16 DIAGNOSIS — Z880 Allergy status to penicillin: Secondary | ICD-10-CM | POA: Diagnosis not present

## 2015-10-16 DIAGNOSIS — E785 Hyperlipidemia, unspecified: Secondary | ICD-10-CM | POA: Diagnosis not present

## 2015-10-16 DIAGNOSIS — F17210 Nicotine dependence, cigarettes, uncomplicated: Secondary | ICD-10-CM | POA: Insufficient documentation

## 2015-10-16 DIAGNOSIS — I739 Peripheral vascular disease, unspecified: Secondary | ICD-10-CM | POA: Insufficient documentation

## 2015-10-16 DIAGNOSIS — K621 Rectal polyp: Secondary | ICD-10-CM | POA: Diagnosis not present

## 2015-10-16 DIAGNOSIS — I10 Essential (primary) hypertension: Secondary | ICD-10-CM | POA: Insufficient documentation

## 2015-10-16 DIAGNOSIS — Z1211 Encounter for screening for malignant neoplasm of colon: Secondary | ICD-10-CM

## 2015-10-16 DIAGNOSIS — Z888 Allergy status to other drugs, medicaments and biological substances status: Secondary | ICD-10-CM | POA: Diagnosis not present

## 2015-10-16 DIAGNOSIS — F419 Anxiety disorder, unspecified: Secondary | ICD-10-CM | POA: Diagnosis not present

## 2015-10-16 DIAGNOSIS — Z8249 Family history of ischemic heart disease and other diseases of the circulatory system: Secondary | ICD-10-CM | POA: Insufficient documentation

## 2015-10-16 HISTORY — PX: POLYPECTOMY: SHX5525

## 2015-10-16 HISTORY — DX: Unspecified hearing loss, unspecified ear: H91.90

## 2015-10-16 HISTORY — PX: COLONOSCOPY WITH PROPOFOL: SHX5780

## 2015-10-16 HISTORY — DX: Anemia, unspecified: D64.9

## 2015-10-16 SURGERY — COLONOSCOPY WITH PROPOFOL
Anesthesia: Monitor Anesthesia Care | Wound class: Contaminated

## 2015-10-16 MED ORDER — ACETAMINOPHEN 160 MG/5ML PO SOLN: 325.0000 mg | ORAL | Status: DC | PRN

## 2015-10-16 MED ORDER — LIDOCAINE HCL (CARDIAC) 20 MG/ML IV SOLN
INTRAVENOUS | Status: DC | PRN
  Administered 2015-10-16: 50 mg via INTRAVENOUS

## 2015-10-16 MED ORDER — LACTATED RINGERS IV SOLN
INTRAVENOUS | Status: DC
  Administered 2015-10-16: 08:00:00 via INTRAVENOUS

## 2015-10-16 MED ORDER — ACETAMINOPHEN 325 MG PO TABS: 325.0000 mg | ORAL_TABLET | ORAL | Status: DC | PRN

## 2015-10-16 MED ORDER — STERILE WATER FOR IRRIGATION IR SOLN
Status: DC | PRN
  Administered 2015-10-16: 09:00:00

## 2015-10-16 MED ORDER — PROPOFOL 10 MG/ML IV BOLUS
INTRAVENOUS | Status: DC | PRN
  Administered 2015-10-16 (×8): 20 mg via INTRAVENOUS
  Administered 2015-10-16: 50 mg via INTRAVENOUS
  Administered 2015-10-16 (×3): 20 mg via INTRAVENOUS
  Administered 2015-10-16: 10 mg via INTRAVENOUS
  Administered 2015-10-16 (×2): 20 mg via INTRAVENOUS

## 2015-10-16 SURGICAL SUPPLY — 23 items
CANISTER SUCT 1200ML W/VALVE (MISCELLANEOUS) ×4 IMPLANT
CLIP HMST 235XBRD CATH ROT (MISCELLANEOUS) ×2 IMPLANT
CLIP RESOLUTION 360 11X235 (MISCELLANEOUS) ×2
FCP ESCP3.2XJMB 240X2.8X (MISCELLANEOUS)
FORCEPS BIOP RAD 4 LRG CAP 4 (CUTTING FORCEPS) IMPLANT
FORCEPS BIOP RJ4 240 W/NDL (MISCELLANEOUS)
FORCEPS ESCP3.2XJMB 240X2.8X (MISCELLANEOUS) IMPLANT
GOWN CVR UNV OPN BCK APRN NK (MISCELLANEOUS) ×4 IMPLANT
GOWN ISOL THUMB LOOP REG UNIV (MISCELLANEOUS) ×4
INJECTOR VARIJECT VIN23 (MISCELLANEOUS) IMPLANT
KIT DEFENDO VALVE AND CONN (KITS) IMPLANT
KIT ENDO PROCEDURE OLY (KITS) ×4 IMPLANT
MARKER SPOT ENDO TATTOO 5ML (MISCELLANEOUS) IMPLANT
PAD GROUND ADULT SPLIT (MISCELLANEOUS) ×4 IMPLANT
PROBE APC STR FIRE (PROBE) ×4 IMPLANT
RETRIEVER NET ROTH 2.5X230 LF (MISCELLANEOUS) IMPLANT
SNARE SHORT THROW 13M SML OVAL (MISCELLANEOUS) IMPLANT
SNARE SHORT THROW 30M LRG OVAL (MISCELLANEOUS) IMPLANT
SNARE SNG USE RND 15MM (INSTRUMENTS) IMPLANT
SPOT EX ENDOSCOPIC TATTOO (MISCELLANEOUS)
TRAP ETRAP POLY (MISCELLANEOUS) IMPLANT
VARIJECT INJECTOR VIN23 (MISCELLANEOUS)
WATER STERILE IRR 250ML POUR (IV SOLUTION) ×4 IMPLANT

## 2015-10-16 NOTE — Anesthesia Preprocedure Evaluation (Signed)
Anesthesia Evaluation  Patient identified by MRN, date of birth, ID band Patient awake    Reviewed: Allergy & Precautions, H&P , NPO status , Patient's Chart, lab work & pertinent test results  Airway Mallampati: III  TM Distance: >3 FB Neck ROM: full    Dental no notable dental hx.    Pulmonary shortness of breath and with exertion, Current Smoker,    Pulmonary exam normal        Cardiovascular hypertension, + Peripheral Vascular Disease  Normal cardiovascular exam     Neuro/Psych    GI/Hepatic   Endo/Other    Renal/GU      Musculoskeletal   Abdominal   Peds  Hematology   Anesthesia Other Findings   Reproductive/Obstetrics                             Anesthesia Physical Anesthesia Plan  ASA: III  Anesthesia Plan: MAC   Post-op Pain Management:    Induction:   Airway Management Planned:   Additional Equipment:   Intra-op Plan:   Post-operative Plan:   Informed Consent: I have reviewed the patients History and Physical, chart, labs and discussed the procedure including the risks, benefits and alternatives for the proposed anesthesia with the patient or authorized representative who has indicated his/her understanding and acceptance.     Plan Discussed with:   Anesthesia Plan Comments:         Anesthesia Quick Evaluation

## 2015-10-16 NOTE — Transfer of Care (Signed)
Immediate Anesthesia Transfer of Care Note  Patient: Sara Roy  Procedure(s) Performed: Procedure(s): COLONOSCOPY WITH PROPOFOL (N/A) POLYPECTOMY  Patient Location: PACU  Anesthesia Type: MAC  Level of Consciousness: awake, alert  and patient cooperative  Airway and Oxygen Therapy: Patient Spontanous Breathing and Patient connected to supplemental oxygen  Post-op Assessment: Post-op Vital signs reviewed, Patient's Cardiovascular Status Stable, Respiratory Function Stable, Patent Airway and No signs of Nausea or vomiting  Post-op Vital Signs: Reviewed and stable  Complications: No apparent anesthesia complications

## 2015-10-16 NOTE — H&P (Signed)
Midge Minium, MD [facility] [address], [address] [place], [place] [address] Phone: [phone] Fax : [phone]  Primary Care Physician:  Ruthe Mannan, MD Primary Gastroenterologist:  Dr. Servando Snare  Pre-Procedure History & Physical: HPI:  Sara Roy is a 55 y.o. female is here for a screening colonoscopy.   Past Medical History:  Diagnosis Date  . Anemia   . Anxiety   . Family history of early CAD   . HOH (hard of hearing)    reads lips wears hearing aids  . Hyperlipidemia   . Hypertension   . Peripheral vascular disease (HCC)   . Tobacco abuse     Past Surgical History:  Procedure Laterality Date  . ABDOMINAL AORTAGRAM N/A 10/10/2013   Procedure: ABDOMINAL Ronny Flurry;  Surgeon: Iran Ouch, MD;  Location: [facility] CATH [facility];  Service: Cardiovascular;  Laterality: N/A;  . ABDOMINAL HYSTERECTOMY    . CHOLECYSTECTOMY    . LOWER EXTREMITY ANGIOGRAM Bilateral 10/10/2013   ILIAC   BILATERAL   BY DR ARDA  . SKIN CANCER EXCISION      Prior to Admission medications   Medication Sig Start Date End Date Taking? Authorizing Provider  acetaminophen (TYLENOL) 325 MG tablet Take 2 tablets (650 mg total) by mouth every 4 (four) hours as needed for headache or mild pain. Limit ibuprofen while on aspirin and Plavix, to avoid stomach upset. 10/11/13  Yes Rhonda G Barrett, PA-C  amLODipine (NORVASC) 10 MG tablet TAKE 1 TABLET(10 MG) BY MOUTH DAILY 09/05/15  Yes Dianne Dun, MD  atorvastatin (LIPITOR) 40 MG tablet TAKE 1 TABLET BY MOUTH EVERY DAY 08/14/15  Yes Iran Ouch, MD  ferrous sulfate 325 (65 FE) MG EC tablet Take 1 tablet (325 mg total) by mouth 3 (three) times daily with meals. 10/02/15  Yes Dianne Dun, MD  hydrochlorothiazide (HYDRODIURIL) 25 MG tablet TAKE 1 TABLET BY MOUTH EVERY DAY 07/28/15  Yes Dianne Dun, MD  ALPRAZolam Prudy Feeler) 0.25 MG tablet TAKE 1 TABLET BY MOUTH THREE TIMES DAILY AS NEEDED 09/29/15   Dianne Dun, MD  aspirin EC 81 MG tablet Take 1 tablet (81 mg total) by mouth  daily. 10/26/13   Iran Ouch, MD  fluocinonide-emollient (LIDEX-E) 0.05 % cream Apply 1 application topically as needed (for rash). 11/20/14   Dianne Dun, MD  potassium chloride (K-DUR,KLOR-CON) 10 MEQ tablet TAKE 1 TABLET(10 MEQ) BY MOUTH DAILY 09/29/15   Dianne Dun, MD  Vitamin D, Ergocalciferol, (DRISDOL) 50000 units CAPS capsule Take 1 capsule (50,000 Units total) by mouth every 7 (seven) days. 10/02/15   Dianne Dun, MD    Allergies as of 10/06/2015 - Review Complete 10/02/2015  Allergen Reaction Noted  . Lisinopril  08/19/2011  . Penicillins Hives 08/04/2011  . Losartan Rash 08/20/2011    Family History  Problem Relation Age of Onset  . Heart disease Father     Social History   Social History  . Marital status: Married    Spouse name: N/A  . Number of children: N/A  . Years of education: N/A   Occupational History  . Not on file.   Social History Main Topics  . Smoking status: Current Every Day Smoker    Packs/day: 1.00    Years: 40.00    Types: Cigarettes  . Smokeless tobacco: Never Used  . Alcohol use No  . Drug use:     Types: Marijuana  . Sexual activity: Not on file   Other Topics Concern  .  Not on file   Social History Narrative  . No narrative on file    Review of Systems: See HPI, otherwise negative ROS  Physical Exam: BP (!) 149/75   Pulse 83   Temp 97.5 F (36.4 C) (Temporal)   Resp 16   Ht 5\' 7"  (1.702 m)   Wt 197 lb (89.4 kg)   SpO2 98%   BMI 30.85 kg/m  General:   Alert,  pleasant and cooperative in NAD Head:  Normocephalic and atraumatic. Neck:  Supple; no masses or thyromegaly. Lungs:  Clear throughout to auscultation.    Heart:  Regular rate and rhythm. Abdomen:  Soft, nontender and nondistended. Normal bowel sounds, without guarding, and without rebound.   Neurologic:  Alert and  oriented x4;  grossly normal neurologically.  Impression/Plan: Sara Roy is now here to undergo a screening colonoscopy.  Risks,  benefits, and alternatives regarding colonoscopy have been reviewed with the patient.  Questions have been answered.  All parties agreeable.

## 2015-10-16 NOTE — Anesthesia Procedure Notes (Signed)
Procedure Name: MAC Performed by: Jimmy PicketAMYOT, Aleece Loyd Pre-anesthesia Checklist: Patient identified, Emergency Drugs available, Suction available, Timeout performed and Patient being monitored Patient Re-evaluated:Patient Re-evaluated prior to inductionOxygen Delivery Method: Circle system utilized Preoxygenation: Pre-oxygenation with 100% oxygen Intubation Type: Inhalational induction Ventilation: Mask ventilation without difficulty and Mask ventilation throughout procedure Placement Confirmation: positive ETCO2 Dental Injury: Teeth and Oropharynx as per pre-operative assessment

## 2015-10-16 NOTE — Op Note (Signed)
[facility] Gastroenterology Patient Name: Sara Roy Procedure Date: 10/16/2015 9:02 AM MRN: [id number] Account #: [id number] Date of Birth: 12/18/1960 Admit Type: Outpatient Age: [age] [facility] Gender: Female Note Status: Finalized Procedure:            Colonoscopy Indications:          Screening for colorectal malignant neoplasm Providers:            Midge Miniumarren Nathanuel Cabreja MD, MD Referring MD:         [name] (Referring MD) Medicines:            Propofol per Anesthesia Complications:        No immediate complications. Procedure:            Pre-Anesthesia Assessment:                       - Prior to the procedure, a History and Physical was                        performed, and patient medications and allergies were                        reviewed. The patient's tolerance of previous                        anesthesia was also reviewed. The risks and benefits of                        the procedure and the sedation options and risks were                        discussed with the patient. All questions were                        answered, and informed consent was obtained. Prior                        Anticoagulants: The patient has taken no previous                        anticoagulant or antiplatelet agents. ASA Grade                        Assessment: II - A patient with mild systemic disease.                        After reviewing the risks and benefits, the patient was                        deemed in satisfactory condition to undergo the                        procedure.                       After obtaining informed consent, the colonoscope was                        passed under direct vision. Throughout the procedure,  the patient's blood pressure, pulse, and oxygen                        saturations were monitored continuously. The Olympus CF                        H180AL colonoscope (S#: [id number]) was introduced through                   the anus and advanced to the the terminal ileum. The                        colonoscopy was performed without difficulty. The                        patient tolerated the procedure well. The quality of                        the bowel preparation was excellent. Findings:      The perianal and digital rectal examinations were normal.      A 3 mm polyp was found in the rectum. The polyp was sessile. The polyp       was removed with a cold biopsy forceps. Resection and retrieval were       complete.      A single small localized angiodysplastic lesion with bleeding was found       in the ascending colon. Coagulation for hemostasis using argon beam at 2       liters/minute and 40 watts was successful. To stop active bleeding, one       hemostatic clip was successfully placed (MR conditional). There was no       bleeding at the end of the procedure. Impression:           - One 3 mm polyp in the rectum, removed with a cold                        biopsy forceps. Resected and retrieved.                       - A single bleeding colonic angiodysplastic lesion.                        Treated with argon beam coagulation. Clip (MR                        conditional) was placed. Recommendation:       - Await pathology results.                       - Repeat colonoscopy in 5 years if polyp adenoma and 10                        years if hyperplastic Procedure Code(s):    --- Professional ---                       [phone], 59, Colonoscopy, flexible; with control of                        bleeding, any method  [address], Colonoscopy, flexible; with biopsy, single or                        multiple Diagnosis Code(s):    --- Professional ---                       Z12.11, Encounter for screening for malignant neoplasm                        of colon                       K62.1, Rectal polyp                       K55.21, Angiodysplasia of colon with hemorrhage CPT copyright  2016 American Medical Association. All rights reserved. The codes documented in this report are preliminary and upon coder review may  be revised to meet current compliance requirements. Midge Minium MD, MD 10/16/2015 9:30:10 AM This report has been signed electronically. Number of Addenda: 0 Note Initiated On: 10/16/2015 9:02 AM Scope Withdrawal Time: 0 hours 13 minutes 26 seconds  Total Procedure Duration: 0 hours 17 minutes 0 seconds       [facility]

## 2015-10-16 NOTE — Anesthesia Postprocedure Evaluation (Signed)
Anesthesia Post Note  Patient: Sara Roy  Procedure(s) Performed: Procedure(s) (LRB): COLONOSCOPY WITH PROPOFOL (N/A) POLYPECTOMY  Patient location during evaluation: PACU Anesthesia Type: MAC Level of consciousness: awake and alert and oriented Pain management: satisfactory to patient Vital Signs Assessment: post-procedure vital signs reviewed and stable Respiratory status: spontaneous breathing, nonlabored ventilation and respiratory function stable Cardiovascular status: blood pressure returned to baseline and stable Postop Assessment: Adequate PO intake and No signs of nausea or vomiting Anesthetic complications: no    Cherly BeachStella, Wallie Lagrand J

## 2015-10-17 ENCOUNTER — Telehealth: Payer: Self-pay | Admitting: Family Medicine

## 2015-10-17 ENCOUNTER — Encounter: Payer: Self-pay | Admitting: Gastroenterology

## 2015-10-17 ENCOUNTER — Inpatient Hospital Stay: Payer: Managed Care, Other (non HMO) | Admitting: Oncology

## 2015-10-17 NOTE — Telephone Encounter (Signed)
Received a call from Sara Roy niece of patient, Sara Roy had colonoscopy done by Dr Servando SnareWohl and they said she had a angiodysplastic lesion  that was bleeding and that Dr Servando SnareWohl thought that this was the cause of her anemia. They cancelled the appt with the Hematologist [facility] . Patient would like you to check her labs again when you feel she should be checked. Please call the niece as the patient is very deaf and she uses Sara Roy to take her calls because it is much easier. Best number to reach her is [phone] work# or her cell [phone].

## 2015-10-20 ENCOUNTER — Encounter: Payer: Self-pay | Admitting: Gastroenterology

## 2015-10-21 ENCOUNTER — Encounter: Payer: Self-pay | Admitting: Gastroenterology

## 2015-10-22 NOTE — Telephone Encounter (Signed)
Thanks for the update. Please schedule repeat CBC in 4 weeks.

## 2015-10-22 NOTE — Telephone Encounter (Signed)
Spoke to pt and advised. F/u labs scheduled 

## 2015-10-27 ENCOUNTER — Other Ambulatory Visit: Payer: Self-pay | Admitting: Family Medicine

## 2015-11-06 ENCOUNTER — Other Ambulatory Visit: Payer: Self-pay | Admitting: Internal Medicine

## 2015-11-12 ENCOUNTER — Other Ambulatory Visit: Payer: Self-pay | Admitting: Family Medicine

## 2015-11-13 ENCOUNTER — Other Ambulatory Visit: Payer: Self-pay | Admitting: Internal Medicine

## 2015-11-13 DIAGNOSIS — D500 Iron deficiency anemia secondary to blood loss (chronic): Secondary | ICD-10-CM

## 2015-11-19 ENCOUNTER — Other Ambulatory Visit (INDEPENDENT_AMBULATORY_CARE_PROVIDER_SITE_OTHER): Payer: Managed Care, Other (non HMO)

## 2015-11-19 DIAGNOSIS — D500 Iron deficiency anemia secondary to blood loss (chronic): Secondary | ICD-10-CM | POA: Diagnosis not present

## 2015-11-19 LAB — CBC WITH DIFFERENTIAL/PLATELET
BASOS PCT: 0.3 % (ref 0.0–3.0)
Basophils Absolute: 0 10*3/uL (ref 0.0–0.1)
EOS PCT: 2.3 % (ref 0.0–5.0)
Eosinophils Absolute: 0.3 10*3/uL (ref 0.0–0.7)
HEMATOCRIT: 40.1 % (ref 36.0–46.0)
HEMOGLOBIN: 12.8 g/dL (ref 12.0–15.0)
LYMPHS PCT: 27.7 % (ref 12.0–46.0)
Lymphs Abs: 3.9 10*3/uL (ref 0.7–4.0)
MCHC: 31.9 g/dL (ref 30.0–36.0)
MCV: 72 fl — AB (ref 78.0–100.0)
MONOS PCT: 6 % (ref 3.0–12.0)
Monocytes Absolute: 0.9 10*3/uL (ref 0.1–1.0)
NEUTROS ABS: 9 10*3/uL — AB (ref 1.4–7.7)
Neutrophils Relative %: 63.7 % (ref 43.0–77.0)
PLATELETS: 372 10*3/uL (ref 150.0–400.0)
RBC: 5.57 Mil/uL — ABNORMAL HIGH (ref 3.87–5.11)
RDW: 33.4 % — AB (ref 11.5–15.5)
WBC: 14.2 10*3/uL — AB (ref 4.0–10.5)

## 2015-11-28 ENCOUNTER — Ambulatory Visit: Payer: Managed Care, Other (non HMO) | Admitting: Internal Medicine

## 2015-12-08 ENCOUNTER — Other Ambulatory Visit: Payer: Self-pay | Admitting: Family Medicine

## 2015-12-10 NOTE — Telephone Encounter (Signed)
Last f/u 02/2015 

## 2015-12-10 NOTE — Telephone Encounter (Signed)
Rx called in to requested pharmacy 

## 2016-02-08 ENCOUNTER — Other Ambulatory Visit: Payer: Self-pay | Admitting: Family Medicine

## 2016-02-17 ENCOUNTER — Encounter: Payer: Self-pay | Admitting: Family Medicine

## 2016-02-17 ENCOUNTER — Ambulatory Visit (INDEPENDENT_AMBULATORY_CARE_PROVIDER_SITE_OTHER): Payer: Managed Care, Other (non HMO) | Admitting: Family Medicine

## 2016-02-17 DIAGNOSIS — R21 Rash and other nonspecific skin eruption: Secondary | ICD-10-CM

## 2016-02-17 DIAGNOSIS — J069 Acute upper respiratory infection, unspecified: Secondary | ICD-10-CM

## 2016-02-17 MED ORDER — AZITHROMYCIN 250 MG PO TABS
ORAL_TABLET | ORAL | 0 refills | Status: DC
Start: 2016-02-17 — End: 2016-02-17

## 2016-02-17 MED ORDER — DOXYCYCLINE HYCLATE 100 MG PO TABS: 100.0000 mg | ORAL_TABLET | Freq: Two times a day (BID) | ORAL | 0 refills | Status: DC

## 2016-02-17 MED ORDER — DIPHENHYDRAMINE HCL 25 MG PO CAPS: 25.0000 mg | ORAL_CAPSULE | Freq: Four times a day (QID) | ORAL | 0 refills | Status: DC | PRN

## 2016-02-17 NOTE — Progress Notes (Signed)
Pre visit review using our clinic review tool, if applicable. No additional management support is needed unless otherwise documented below in the visit note. 

## 2016-02-17 NOTE — Assessment & Plan Note (Signed)
Given duration and progression of symptoms in a smoker, will treat for bacterial sinusitis/process with zpack. PCN allergic.

## 2016-02-17 NOTE — Progress Notes (Signed)
SUBJECTIVE:  Sara Roy is a 56 y.o. female who complains of sore throat, coryza, congestion, sneezing, sore throat and bilateral sinus pain for 8 days. She denies a history of anorexia, chest pain and fevers and denies a history of asthma. Patient admits to smoke cigarettes.   Took alkaseltzer cold medicine yesterday and 30 minutes later developed a raise, warm rash on her abdomen and legs.  It is not itchy.  Has not taken anything for this.  Current Outpatient Prescriptions on File Prior to Visit  Medication Sig Dispense Refill  . acetaminophen (TYLENOL) 325 MG tablet Take 2 tablets (650 mg total) by mouth every 4 (four) hours as needed for headache or mild pain. Limit ibuprofen while on aspirin and Plavix, to avoid stomach upset.    Marland Kitchen. ALPRAZolam (XANAX) 0.25 MG tablet TAKE 1 TABLET BY MOUTH THREE TIMES DAILY AS NEEDED 90 tablet 0  . amLODipine (NORVASC) 5 MG tablet TAKE 1 TABLET BY MOUTH EVERY DAY 90 tablet 0  . aspirin EC 81 MG tablet Take 1 tablet (81 mg total) by mouth daily. 90 tablet 3  . atorvastatin (LIPITOR) 40 MG tablet TAKE 1 TABLET BY MOUTH EVERY DAY 90 tablet 3  . fluocinonide-emollient (LIDEX-E) 0.05 % cream Apply 1 application topically as needed (for rash). 30 g 0  . hydrochlorothiazide (HYDRODIURIL) 25 MG tablet TAKE 1 TABLET BY MOUTH EVERY DAY 90 tablet 1  . potassium chloride (K-DUR,KLOR-CON) 10 MEQ tablet TAKE 1 TABLET(10 MEQ) BY MOUTH DAILY 30 tablet 0   No current facility-administered medications on file prior to visit.     Allergies  Allergen Reactions  . Lisinopril     Rash   . Penicillins Hives  . Losartan Rash    Past Medical History:  Diagnosis Date  . Anemia   . Anxiety   . Family history of early CAD   . HOH (hard of hearing)    reads lips wears hearing aids  . Hyperlipidemia   . Hypertension   . Peripheral vascular disease (HCC)   . Tobacco abuse     Past Surgical History:  Procedure Laterality Date  . ABDOMINAL AORTAGRAM N/A 10/10/2013   Procedure: ABDOMINAL Ronny FlurryAORTAGRAM;  Surgeon: Iran OuchMuhammad A Arida, MD;  Location: [facility] CATH [facility];  Service: Cardiovascular;  Laterality: N/A;  . ABDOMINAL HYSTERECTOMY    . CHOLECYSTECTOMY    . COLONOSCOPY WITH PROPOFOL N/A 10/16/2015   Procedure: COLONOSCOPY WITH PROPOFOL;  Surgeon: Midge Miniumarren Wohl, MD;  [facility];  Service: Endoscopy;  Laterality: N/A;  . LOWER EXTREMITY ANGIOGRAM Bilateral 10/10/2013   ILIAC   BILATERAL   BY DR ARDA  . POLYPECTOMY  10/16/2015   Procedure: POLYPECTOMY;  Surgeon: Midge Miniumarren Wohl, MD;  [facility];  Service: Endoscopy;;  . SKIN CANCER EXCISION      Family History  Problem Relation Age of Onset  . Heart disease Father     Social History   Social History  . Marital status: Married    Spouse name: N/A  . Number of children: N/A  . Years of education: N/A   Occupational History  . Not on file.   Social History Main Topics  . Smoking status: Current Every Day Smoker    Packs/day: 1.00    Years: 40.00    Types: Cigarettes  . Smokeless tobacco: Never Used  . Alcohol use No  . Drug use:     Types: Marijuana  . Sexual activity: Not on file   Other Topics Concern  .  Not on file   Social History Narrative  . No narrative on file   The PMH, PSH, Social History, Family History, Medications, and allergies have been reviewed in [facility], and have been updated if relevant.  OBJECTIVE:  BP 118/60   Pulse 86   Temp 98.8 F (37.1 C) (Oral)   Wt 202 lb 8 oz (91.9 kg)   SpO2 95%   BMI 31.72 kg/m   She appears well, vital signs are as noted. Ears normal.  Throat and pharynx normal.  Neck supple. No adenopathy in the neck. Nose is congested. Sinuses  tender. The chest is clear, without wheezes or rales. Raised/? Sandpaper like rash on her abdomen on upper thighs

## 2016-02-17 NOTE — Addendum Note (Signed)
Addended by: Dianne DunARON, TALIA M on: 02/17/2016 12:11 PM   Modules accepted: Orders

## 2016-02-17 NOTE — Patient Instructions (Signed)
Please take zpack as directed. Ok to take a dose of zyrtec or benadryl to see if helps with her rash.  Please keep us updated.

## 2016-02-17 NOTE — Progress Notes (Signed)
Pt reports on her way out that years ago Zpack didn't work for her.  Will d/c zpack and send in doxcycline. The patient indicates understanding of these issues and agrees with the plan.

## 2016-02-17 NOTE — Assessment & Plan Note (Signed)
Rapid strep neg. ? Drug allergy. Advised antihistamine and Call or return to clinic prn if these symptoms worsen or fail to improve as anticipated. The patient indicates understanding of these issues and agrees with the plan.

## 2016-02-27 ENCOUNTER — Other Ambulatory Visit: Payer: Self-pay | Admitting: Family Medicine

## 2016-02-27 NOTE — Telephone Encounter (Signed)
Last f/u 02/2015 

## 2016-03-01 NOTE — Telephone Encounter (Signed)
Rx called in to requested pharmacy 

## 2016-03-14 ENCOUNTER — Other Ambulatory Visit: Payer: Self-pay | Admitting: Family Medicine

## 2016-04-10 ENCOUNTER — Other Ambulatory Visit: Payer: Self-pay | Admitting: Family Medicine

## 2016-05-04 ENCOUNTER — Other Ambulatory Visit: Payer: Self-pay | Admitting: Family Medicine

## 2016-05-05 ENCOUNTER — Other Ambulatory Visit: Payer: Self-pay | Admitting: Family Medicine

## 2016-05-05 NOTE — Telephone Encounter (Signed)
Called pharmacy for Rx 

## 2016-05-05 NOTE — Telephone Encounter (Signed)
Pt last seen 10/02/2015, last refill 02/27/2016  .please advise

## 2016-05-05 NOTE — Telephone Encounter (Signed)
Refilled/send rx to the pharmacy

## 2016-05-28 ENCOUNTER — Other Ambulatory Visit: Payer: Self-pay | Admitting: Family Medicine

## 2016-06-01 ENCOUNTER — Other Ambulatory Visit: Payer: Self-pay | Admitting: Family Medicine

## 2016-06-16 ENCOUNTER — Ambulatory Visit (INDEPENDENT_AMBULATORY_CARE_PROVIDER_SITE_OTHER): Payer: 59 | Admitting: Family Medicine

## 2016-06-16 VITALS — BP 140/70 | HR 79 | Temp 98.0°F | Wt 207.0 lb

## 2016-06-16 DIAGNOSIS — L723 Sebaceous cyst: Secondary | ICD-10-CM

## 2016-06-16 DIAGNOSIS — I10 Essential (primary) hypertension: Secondary | ICD-10-CM | POA: Diagnosis not present

## 2016-06-16 NOTE — Patient Instructions (Signed)
Great to see you.  We will call you with an appointment for a dermatologist.

## 2016-06-16 NOTE — Assessment & Plan Note (Signed)
Refer to derm  ?

## 2016-06-16 NOTE — Progress Notes (Signed)
Subjective:   Patient ID: Sara Roy, female    DOB: 1960/11/16, 56 y.o.   MRN: [id number]  Sara Roy is a pleasant 56 y.o. year old female who presents to clinic today with Verrucous Vulgaris (F/U Wart on left side of face); Cyst (Look at possible cyst above right eyebrow); and Blood Pressure (Checks BP at home sometimes. Says the nmbers are all over the place)  on 06/16/2016  HPI:  HTN- has been well controlled on HCTZ 25 mg daily and Norvasc 5 mg daily. Lately, feels when she checks her BP at home, numbers are "all over the place." Denies HA, blurred vision, CP or SOB. No LE edema.  Lab Results  Component Value Date   CREATININE 0.74 03/10/2015     Wart- has what she thinks is a wart on her left upper cheek.  Went to see dermatology about it last year.  Cryotherapy was not effective.  ? Cyst- area above her right eyebrow, firm, sometimes tender, has been there for at least 7 months.  Grown in size some.  Never bleeds.    Current Outpatient Prescriptions on File Prior to Visit  Medication Sig Dispense Refill  . acetaminophen (TYLENOL) 325 MG tablet Take 2 tablets (650 mg total) by mouth every 4 (four) hours as needed for headache or mild pain. Limit ibuprofen while on aspirin and Plavix, to avoid stomach upset.    Marland Kitchen ALPRAZolam (XANAX) 0.25 MG tablet TAKE 1 TABLET BY MOUTH THREE TIMES DAILY 90 tablet 0  . amLODipine (NORVASC) 5 MG tablet TAKE 1 TABLET BY MOUTH DAILY 30 tablet 0  . aspirin EC 81 MG tablet Take 1 tablet (81 mg total) by mouth daily. 90 tablet 3  . atorvastatin (LIPITOR) 40 MG tablet TAKE 1 TABLET BY MOUTH EVERY DAY 90 tablet 3  . diphenhydrAMINE (BENADRYL) 25 mg capsule Take 1 capsule (25 mg total) by mouth every 6 (six) hours as needed. 30 capsule 0  . fluocinonide-emollient (LIDEX-E) 0.05 % cream Apply 1 application topically as needed (for rash). 30 g 0  . hydrochlorothiazide (HYDRODIURIL) 25 MG tablet TAKE 1 TABLET BY MOUTH EVERY DAY 90 tablet 1  .  potassium chloride (K-DUR,KLOR-CON) 10 MEQ tablet TAKE 1 TABLET(10 MEQ) BY MOUTH DAILY 30 tablet 0   No current facility-administered medications on file prior to visit.     Allergies  Allergen Reactions  . Lisinopril     Rash   . Penicillins Hives  . Losartan Rash    Past Medical History:  Diagnosis Date  . Anemia   . Anxiety   . Family history of early CAD   . HOH (hard of hearing)    reads lips wears hearing aids  . Hyperlipidemia   . Hypertension   . Peripheral vascular disease (HCC)   . Tobacco abuse     Past Surgical History:  Procedure Laterality Date  . ABDOMINAL AORTAGRAM N/A 10/10/2013   Procedure: ABDOMINAL Ronny Flurry;  Surgeon: Iran Ouch, MD;  Location: [facility];  Service: Cardiovascular;  Laterality: N/A;  . ABDOMINAL HYSTERECTOMY    . CHOLECYSTECTOMY    . COLONOSCOPY WITH PROPOFOL N/A 10/16/2015   Procedure: COLONOSCOPY WITH PROPOFOL;  Surgeon: Midge Minium, MD;  [facility];  Service: Endoscopy;  Laterality: N/A;  . LOWER EXTREMITY ANGIOGRAM Bilateral 10/10/2013   ILIAC   BILATERAL   BY DR ARDA  . POLYPECTOMY  10/16/2015   Procedure: POLYPECTOMY;  Surgeon: Midge Minium, MD;  [facility];  Service: Endoscopy;;  . SKIN CANCER EXCISION      Family History  Problem Relation Age of Onset  . Heart disease Father     Social History   Social History  . Marital status: Married    Spouse name: N/A  . Number of children: N/A  . Years of education: N/A   Occupational History  . Not on file.   Social History Main Topics  . Smoking status: Current Every Day Smoker    Packs/day: 1.00    Years: 40.00    Types: Cigarettes  . Smokeless tobacco: Never Used  . Alcohol use No  . Drug use: Yes    Types: Marijuana  . Sexual activity: Not on file   Other Topics Concern  . Not on file   Social History Narrative  . No narrative on file   The PMH, PSH, Social History, Family History, Medications, and allergies have  been reviewed [facility], and have been updated if relevant.   Review of Systems  Constitutional: Negative.   Respiratory: Negative.   Cardiovascular: Negative.   Genitourinary: Negative.   Neurological: Negative.   Hematological: Negative.   All other systems reviewed and are negative.      Objective:    BP 140/70 (BP Location: Right Arm, Patient Position: Sitting, Cuff Size: Large)   Pulse 79   Temp 98 F (36.7 C) (Oral)   Wt 207 lb (93.9 kg)   SpO2 97%   BMI 32.42 kg/m    Physical Exam  Constitutional: She is oriented to person, place, and time. She appears well-developed and well-nourished. No distress.  HENT:  Head: Normocephalic and atraumatic.  Eyes: Conjunctivae are normal.  Cardiovascular: Normal rate and regular rhythm.   Pulmonary/Chest: Effort normal and breath sounds normal.  Musculoskeletal: Normal range of motion.  Neurological: She is alert and oriented to person, place, and time. No cranial nerve deficit.  Skin: She is not diaphoretic.     Psychiatric: She has a normal mood and affect. Her behavior is normal. Judgment and thought content normal.  Nursing note and vitals reviewed.         Assessment & Plan:   Essential hypertension  Sebaceous cyst - Plan: Ambulatory referral to Dermatology No Follow-up on file.

## 2016-06-16 NOTE — Progress Notes (Signed)
Pre visit review using our clinic review tool, if applicable. No additional management support is needed unless otherwise documented below in the visit note. 

## 2016-06-16 NOTE — Assessment & Plan Note (Signed)
Reasonable control.  Reassurance provided.  Advised to bring in home cuff to compare to our BP cuff here. The patient indicates understanding of these issues and agrees with the plan.

## 2016-06-23 ENCOUNTER — Other Ambulatory Visit: Payer: Self-pay | Admitting: Family Medicine

## 2016-06-24 ENCOUNTER — Other Ambulatory Visit: Payer: Self-pay | Admitting: Family Medicine

## 2016-06-25 ENCOUNTER — Other Ambulatory Visit: Payer: Self-pay | Admitting: Family Medicine

## 2016-06-28 ENCOUNTER — Telehealth: Payer: Self-pay | Admitting: *Deleted

## 2016-06-28 MED ORDER — AMLODIPINE BESYLATE 5 MG PO TABS: 5.0000 mg | ORAL_TABLET | Freq: Every day | ORAL | 1 refills | Status: DC

## 2016-06-28 NOTE — Telephone Encounter (Signed)
Returned patient's call and corrected her refill as requested.

## 2016-06-28 NOTE — Telephone Encounter (Signed)
Pt contacted office regarding BP meds. She did not want to provide any additional information and states she was told to speak directly with Dr Dayton MartesAron,

## 2016-07-07 ENCOUNTER — Other Ambulatory Visit: Payer: Self-pay | Admitting: Family Medicine

## 2016-07-07 NOTE — Telephone Encounter (Signed)
Last office visit 06/16/2016.  Last refilled 05/05/2016 for #90 with no refills.  Ok to refill?

## 2016-07-07 NOTE — Telephone Encounter (Signed)
Ok to phone in Xanax 

## 2016-07-07 NOTE — Telephone Encounter (Signed)
Alprazolam called into [facility] - [place], [place] - [address] Phone: [phone] 

## 2016-07-20 ENCOUNTER — Ambulatory Visit (INDEPENDENT_AMBULATORY_CARE_PROVIDER_SITE_OTHER): Payer: 59 | Admitting: Cardiovascular Disease

## 2016-07-20 ENCOUNTER — Encounter: Payer: Self-pay | Admitting: Cardiovascular Disease

## 2016-07-20 VITALS — BP 130/68 | HR 71 | Ht 67.0 in | Wt 207.0 lb

## 2016-07-20 DIAGNOSIS — E785 Hyperlipidemia, unspecified: Secondary | ICD-10-CM | POA: Diagnosis not present

## 2016-07-20 DIAGNOSIS — I10 Essential (primary) hypertension: Secondary | ICD-10-CM

## 2016-07-20 DIAGNOSIS — Z720 Tobacco use: Secondary | ICD-10-CM | POA: Diagnosis not present

## 2016-07-20 DIAGNOSIS — I739 Peripheral vascular disease, unspecified: Secondary | ICD-10-CM

## 2016-07-20 NOTE — Progress Notes (Signed)
Cardiology Office Note   Date:  07/20/2016   ID:  Sara Roy, DOB 09/04/1960, MRN [id number]  PCP:  Dianne Dun, MD  Cardiologist:   Lorine Bears, MD   Chief Complaint  Patient presents with  . other    12 month follow up. Meds reviewed by the pt. verbally. "doing well."       History of Present Illness: Sara Roy is a 56 y.o. female who presents for  a follow up visit regarding peripheral arterial disease. She has no previous cardiac history. She has chronic medical conditions that include hypertension, hyperlipidemia and prolonged tobacco use. She smokes one half pack per day and has been doing so for more than 40 years. She is s/p Bilateral kissing stent placement to both common iliac arteries into the distal aorta in September 2015 for severe claudication.  She had an echocardiogram done in June 2016 for a heart murmur which showed normal LV systolic function with no significant valvular abnormalities. The aortic valve was not well-visualized.  She has been doing well and denies any chest pain or worsening dyspnea. No leg claudication. She denies palpitations. Unfortunately, she continues to smoke.  Past Medical History:  Diagnosis Date  . Anemia   . Anxiety   . Family history of early CAD   . HOH (hard of hearing)    reads lips wears hearing aids  . Hyperlipidemia   . Hypertension   . Peripheral vascular disease (HCC)   . Tobacco abuse     Past Surgical History:  Procedure Laterality Date  . ABDOMINAL AORTAGRAM N/A 10/10/2013   Procedure: ABDOMINAL Ronny Flurry;  Surgeon: Iran Ouch, MD;  Location: [facility];  Service: Cardiovascular;  Laterality: N/A;  . ABDOMINAL HYSTERECTOMY    . CHOLECYSTECTOMY    . COLONOSCOPY WITH PROPOFOL N/A 10/16/2015   Procedure: COLONOSCOPY WITH PROPOFOL;  Surgeon: Midge Minium, MD;  Location: [facility];  Service: Endoscopy;  Laterality: N/A;  . LOWER EXTREMITY ANGIOGRAM Bilateral 10/10/2013   ILIAC   BILATERAL   BY  DR ARDA  . POLYPECTOMY  10/16/2015   Procedure: POLYPECTOMY;  Surgeon: Midge Minium, MD;  Location: [facility];  Service: Endoscopy;;  . SKIN CANCER EXCISION       Current Outpatient Prescriptions  Medication Sig Dispense Refill  . acetaminophen (TYLENOL) 325 MG tablet Take 2 tablets (650 mg total) by mouth every 4 (four) hours as needed for headache or mild pain. Limit ibuprofen while on aspirin and Plavix, to avoid stomach upset.    Marland Kitchen ALPRAZolam (XANAX) 0.25 MG tablet TABLET 1 TABLET BY MOUTH THREE TIMES DAILY 90 tablet 0  . amLODipine (NORVASC) 5 MG tablet Take 1 tablet (5 mg total) by mouth daily. 90 tablet 1  . aspirin EC 81 MG tablet Take 1 tablet (81 mg total) by mouth daily. 90 tablet 3  . atorvastatin (LIPITOR) 40 MG tablet TAKE 1 TABLET BY MOUTH EVERY DAY 90 tablet 3  . diphenhydrAMINE (BENADRYL) 25 mg capsule Take 1 capsule (25 mg total) by mouth every 6 (six) hours as needed. 30 capsule 0  . fluocinonide-emollient (LIDEX-E) 0.05 % cream Apply 1 application topically as needed (for rash). 30 g 0  . hydrochlorothiazide (HYDRODIURIL) 25 MG tablet TAKE 1 TABLET BY MOUTH EVERY DAY 90 tablet 1  . potassium chloride (K-DUR,KLOR-CON) 10 MEQ tablet TAKE 1 TABLET(10 MEQ) BY MOUTH DAILY 30 tablet 0   No current facility-administered medications for this visit.  Allergies:   Lisinopril; Penicillins; and Losartan    Social History:  The patient  reports that she has been smoking Cigarettes.  She has a 40.00 pack-year smoking history. She has never used smokeless tobacco. She reports that she uses drugs, including Marijuana. She reports that she does not drink alcohol.   Family History:  The patient's family history includes Heart disease in her father.    ROS:  Please see the history of present illness.   Otherwise, review of systems are positive for none.   All other systems are reviewed and negative.    PHYSICAL EXAM: VS:  BP 130/68 (BP Location: Left Arm, Patient  Position: Sitting, Cuff Size: Normal)   Pulse 71   Ht 5\' 7"  (1.702 m)   Wt 207 lb (93.9 kg)   BMI 32.42 kg/m  , BMI Body mass index is 32.42 kg/m. GEN: Well nourished, well developed, in no acute distress  HEENT: normal  Neck: no JVD, carotid bruits, or masses Cardiac: RRR; no rubs, or gallops,no edema . 2/6 crescendo decrescendo systolic murmur in the aortic area which is early peaking. Respiratory:  clear to auscultation bilaterally, normal work of breathing GI: soft, nontender, nondistended, + BS MS: no deformity or atrophy  Skin: warm and dry, no rash Neuro:  Strength and sensation are intact Psych: euthymic mood, full affect   EKG:  EKG is ordered today. The ekg ordered today demonstrates normal sinus rhythm with incomplete right bundle branch block. Occasional PVCs.   Recent Labs: 11/19/2015: Hemoglobin 12.8; Platelets 372.0    Lipid Panel    Component Value Date/Time   CHOL 132 03/10/2015 1128   CHOL 139 10/26/2013 0757   TRIG 187.0 (H) 03/10/2015 1128   HDL 36.00 (L) 03/10/2015 1128   HDL 41 10/26/2013 0757   CHOLHDL 4 03/10/2015 1128   VLDL 37.4 03/10/2015 1128   LDLCALC 58 03/10/2015 1128   LDLCALC 76 10/26/2013 0757   LDLDIRECT 162.7 08/18/2011 1007      Wt Readings from Last 3 Encounters:  07/20/16 207 lb (93.9 kg)  06/16/16 207 lb (93.9 kg)  02/17/16 202 lb 8 oz (91.9 kg)        ASSESSMENT AND PLAN:  1.  Peripheral arterial disease: Status post bilateral common iliac artery stent placement with No recurrent claudication.  I requested a follow-up aortoiliac duplex an ABI.  2. Essential hypertension:  Blood pressure is controlled on current medications.  3. Hyperlipidemia: Continue treatment with atorvastatin 40 mg once daily. Most recent lipid profile showed an LDL of 58.  4. Tobacco use: I again discussed with her the importance of smoking cessation but she  has not decided if she wants to quit.  Disposition:   FU with me in 1  year  Signed,  Lorine BearsMuhammad Sajan Cheatwood, MD  07/20/2016 3:45 PM    [facility]

## 2016-07-20 NOTE — Patient Instructions (Addendum)
Medication Instructions:  Your physician recommends that you continue on your current medications as directed. Please refer to the Current Medication list given to you today.   Labwork: none  Testing/Procedures: Your physician has requested that you have an ankle brachial index (ABI). During this test an ultrasound and blood pressure cuff are used to evaluate the arteries that supply the arms and legs with blood. Allow thirty minutes for this exam. There are no restrictions or special instructions.  Please schedule aorta iliac duplex in our office. Ultra sound is used to check the blood flow. Avoid carbonated beverages the day before. Nothing to eat or drink after midnight the evening before your test.    Follow-Up: Your physician wants you to follow-up in: one year with Dr. Kirke Corin.  You will receive a reminder letter in the mail two months in advance. If you don't receive a letter, please call our office to schedule the follow-up appointment.   Any Other Special Instructions Will Be Listed Below (If Applicable).     If you need a refill on your cardiac medications before your next appointment, please call your pharmacy.   Coping with Quitting Smoking Quitting smoking is a physical and mental challenge. You will face cravings, withdrawal symptoms, and temptation. Before quitting, work with your health care provider to make a plan that can help you cope. Preparation can help you quit and keep you from giving in. How can I cope with cravings? Cravings usually last for 5-10 minutes. If you get through it, the craving will pass. Consider taking the following actions to help you cope with cravings:  Keep your mouth busy: ? Chew sugar-free gum. ? Suck on hard candies or a straw. ? Brush your teeth.  Keep your hands and body busy: ? Immediately change to a different activity when you feel a craving. ? Squeeze or play with a ball. ? Do an activity or a hobby, like making bead jewelry,  practicing needlepoint, or working with wood. ? Mix up your normal routine. ? Take a short exercise break. Go for a quick walk or run up and down stairs. ? Spend time in public places where smoking is not allowed.  Focus on doing something kind or helpful for someone else.  Call a friend or family member to talk during a craving.  Join a support group.  Call a quit line, such as 1-800-QUIT-NOW.  Talk with your health care provider about medicines that might help you cope with cravings and make quitting easier for you.  How can I deal with withdrawal symptoms? Your body may experience negative effects as it tries to get used to not having nicotine in the system. These effects are called withdrawal symptoms. They may include:  Feeling hungrier than normal.  Trouble concentrating.  Irritability.  Trouble sleeping.  Feeling depressed.  Restlessness and agitation.  Craving a cigarette.  To manage withdrawal symptoms:  Avoid places, people, and activities that trigger your cravings.  Remember why you want to quit.  Get plenty of sleep.  Avoid coffee and other caffeinated drinks. These may worsen some of your symptoms.  How can I handle social situations? Social situations can be difficult when you are quitting smoking, especially in the first few weeks. To manage this, you can:  Avoid parties, bars, and other social situations where people might be smoking.  Avoid alcohol.  Leave right away if you have the urge to smoke.  Explain to your family and friends that you are quitting  smoking. Ask for understanding and support.  Plan activities with friends or family where smoking is not an option.  What are some ways I can cope with stress? Wanting to smoke may cause stress, and stress can make you want to smoke. Find ways to manage your stress. Relaxation techniques can help. For example:  Breathe slowly and deeply, in through your nose and out through your  mouth.  Listen to soothing, relaxing music.  Talk with a family member or friend about your stress.  Light a candle.  Soak in a bath or take a shower.  Think about a peaceful place.  What are some ways I can prevent weight gain? Be aware that many people gain weight after they quit smoking. However, not everyone does. To keep from gaining weight, have a plan in place before you quit and stick to the plan after you quit. Your plan should include:  Having healthy snacks. When you have a craving, it may help to: ? Eat plain popcorn, crunchy carrots, celery, or other cut vegetables. ? Chew sugar-free gum.  Changing how you eat: ? Eat small portion sizes at meals. ? Eat 4-6 small meals throughout the day instead of 1-2 large meals a day. ? Be mindful when you eat. Do not watch television or do other things that might distract you as you eat.  Exercising regularly: ? Make time to exercise each day. If you do not have time for a long workout, do short bouts of exercise for 5-10 minutes several times a day. ? Do some form of strengthening exercise, like weight lifting, and some form of aerobic exercise, like running or swimming.  Drinking plenty of water or other low-calorie or no-calorie drinks. Drink 6-8 glasses of water daily, or as much as instructed by your health care provider.  Summary  Quitting smoking is a physical and mental challenge. You will face cravings, withdrawal symptoms, and temptation to smoke again. Preparation can help you as you go through these challenges.  You can cope with cravings by keeping your mouth busy (such as by chewing gum), keeping your body and hands busy, and making calls to family, friends, or a helpline for people who want to quit smoking.  You can cope with withdrawal symptoms by avoiding places where people smoke, avoiding drinks with caffeine, and getting plenty of rest.  Ask your health care provider about the different ways to prevent weight  gain, avoid stress, and handle social situations. This information is not intended to replace advice given to you by your health care provider. Make sure you discuss any questions you have with your health care provider. Document Released: 01/23/2016 Document Revised: 01/23/2016 Document Reviewed: 01/23/2016 Elsevier Interactive Patient Education  2018 [employer].  Steps to Quit Smoking Smoking tobacco can be bad for your health. It can also affect almost every organ in your body. Smoking puts you and people around you at risk for many serious long-lasting (chronic) diseases. Quitting smoking is hard, but it is one of the best things that you can do for your health. It is never too late to quit. What are the benefits of quitting smoking? When you quit smoking, you lower your risk for getting serious diseases and conditions. They can include:  Lung cancer or lung disease.  Heart disease.  Stroke.  Heart attack.  Not being able to have children (infertility).  Weak bones (osteoporosis) and broken bones (fractures).  If you have coughing, wheezing, and shortness of breath, those symptoms  may get better when you quit. You may also get sick less often. If you are pregnant, quitting smoking can help to lower your chances of having a baby of low birth weight. What can I do to help me quit smoking? Talk with your doctor about what can help you quit smoking. Some things you can do (strategies) include:  Quitting smoking totally, instead of slowly cutting back how much you smoke over a period of time.  Going to in-person counseling. You are more likely to quit if you go to many counseling sessions.  Using resources and support systems, such as: ? Agricultural engineernline chats with a Veterinary surgeoncounselor. ? Phone quitlines. ? Automotive engineerrinted self-help materials. ? Support groups or group counseling. ? Text messaging programs. ? Mobile phone apps or applications.  Taking medicines. Some of these medicines may have nicotine  in them. If you are pregnant or breastfeeding, do not take any medicines to quit smoking unless your doctor says it is okay. Talk with your doctor about counseling or other things that can help you.  Talk with your doctor about using more than one strategy at the same time, such as taking medicines while you are also going to in-person counseling. This can help make quitting easier. What things can I do to make it easier to quit? Quitting smoking might feel very hard at first, but there is a lot that you can do to make it easier. Take these steps:  Talk to your family and friends. Ask them to support and encourage you.  Call phone quitlines, reach out to support groups, or work with a Veterinary surgeoncounselor.  Ask people who smoke to not smoke around you.  Avoid places that make you want (trigger) to smoke, such as: ? Bars. ? Parties. ? Smoke-break areas at work.  Spend time with people who do not smoke.  Lower the stress in your life. Stress can make you want to smoke. Try these things to help your stress: ? Getting regular exercise. ? Deep-breathing exercises. ? Yoga. ? Meditating. ? Doing a body scan. To do this, close your eyes, focus on one area of your body at a time from head to toe, and notice which parts of your body are tense. Try to relax the muscles in those areas.  Download or buy apps on your mobile phone or tablet that can help you stick to your quit plan. There are many free apps, such as QuitGuide from the [employer] developer(Centers for Disease Control and Prevention). You can find more support from smokefree.gov and other websites.  This information is not intended to replace advice given to you by your health care provider. Make sure you discuss any questions you have with your health care provider. Document Released: 11/21/2008 Document Revised: 09/23/2015 Document Reviewed: 06/11/2014 Elsevier Interactive Patient Education  2018 [employer]

## 2016-07-31 ENCOUNTER — Other Ambulatory Visit: Payer: Self-pay | Admitting: Cardiovascular Disease

## 2016-07-31 ENCOUNTER — Other Ambulatory Visit: Payer: Self-pay | Admitting: Family Medicine

## 2016-08-25 ENCOUNTER — Other Ambulatory Visit: Payer: Self-pay | Admitting: Family Medicine

## 2016-08-27 ENCOUNTER — Ambulatory Visit: Payer: 59

## 2016-08-27 DIAGNOSIS — I739 Peripheral vascular disease, unspecified: Secondary | ICD-10-CM | POA: Diagnosis not present

## 2016-09-20 ENCOUNTER — Other Ambulatory Visit: Payer: Self-pay | Admitting: Family Medicine

## 2016-10-28 ENCOUNTER — Other Ambulatory Visit: Payer: Self-pay | Admitting: Cardiovascular Disease

## 2016-12-04 ENCOUNTER — Other Ambulatory Visit: Payer: Self-pay | Admitting: Family Medicine

## 2016-12-06 NOTE — Telephone Encounter (Signed)
Faxed to pharm/thx dmf 

## 2016-12-06 NOTE — Telephone Encounter (Signed)
Requesting: Alprazolam Contract: Yes UDS: 1.30.2018 Low-risk next screen 7.30.2017 Last OV: 5.09.2018 Next OV: Not scheduled Last Refill: 8.14.2018   Please advise

## 2016-12-07 ENCOUNTER — Other Ambulatory Visit: Payer: Self-pay | Admitting: Family Medicine

## 2017-02-03 ENCOUNTER — Telehealth: Payer: Self-pay | Admitting: Family Medicine

## 2017-02-03 ENCOUNTER — Ambulatory Visit: Payer: Self-pay | Admitting: *Deleted

## 2017-02-03 NOTE — Telephone Encounter (Signed)
Pt reports severe dry cough for 1 week, onset 01/25/17. Has been taking Robitussin DM and Mucinex x 3 days, states ineffective.  Cough keeps her awake at night. Constant, spasmodic at times. Non-productive. Unsure of fever, chest "achy" due to coughing. Reports h/o "enlarged heart."  [facility] to [facility] and pt declines seeing any other provider. Also works and can not make it to any appt tomorrow. Advised to go to [facility]. Pt states will do so today. Reason for Disposition . [1] Continuous (nonstop) coughing interferes with work or school AND [2] no improvement using cough treatment per protocol  Answer Assessment - Initial Assessment Questions 1. ONSET: "When did the cough begin?"     01/25/17 2. SEVERITY: "How bad is the cough today?"       keeping awake at night, severe episodes 3. RESPIRATORY DISTRESS: "Describe your breathing."      "A little" 4. FEVER: "Do you have a fever?" If so, ask: "What is your temperature, how was it measured, and when did it start?"     "Not sure" 5. HEMOPTYSIS: "Are you coughing up any blood?" If so ask: "How much?" (flecks, streaks, tablespoons, etc.)     Dry 6. TREATMENT: "What have you done so far to treat the cough?" (e.g., meds, fluids, humidifier)     Robitussin DM, Mucinex  7. CARDIAC HISTORY: "Do you have any history of heart disease?" (e.g., heart attack, congestive heart failure)      Cardiomegaly, 2 stents BLEs..@RESUFAST (HIV1RNAQUANT:3)@ years ago 8. LUNG HISTORY: "Do you have any history of lung disease?"  (e.g., pulmonary embolus, asthma, emphysema)     No. Smoker 9. PE RISK FACTORS: "Do you have a history of blood clots?" (or: recent major surgery, recent prolonged travel, bedridden )     No 10. OTHER SYMPTOMS: "Do you have any other symptoms? (e.g., runny nose, wheezing, chest pain)       Chest aches from coughing so much. Sinus pressure, headache. 11. PREGNANCY: "Is there any chance you are pregnant?" "When was your last  menstrual period?"       no 12. TRAVEL: "Have you traveled out of the country in the last month?" (e.g., travel history, exposures)       no  Protocols used: COUGH - ACUTE NON-PRODUCTIVE-A-AH

## 2017-02-17 ENCOUNTER — Other Ambulatory Visit: Payer: Self-pay | Admitting: Family Medicine

## 2017-02-21 ENCOUNTER — Other Ambulatory Visit: Payer: Self-pay | Admitting: Cardiovascular Disease

## 2017-03-02 ENCOUNTER — Other Ambulatory Visit: Payer: Self-pay | Admitting: Family Medicine

## 2017-03-06 ENCOUNTER — Other Ambulatory Visit: Payer: Self-pay | Admitting: Family Medicine

## 2017-03-06 DIAGNOSIS — E876 Hypokalemia: Secondary | ICD-10-CM

## 2017-03-08 NOTE — Telephone Encounter (Signed)
Yes I have approved it but she does need to come in for BMET.  Thank you!

## 2017-03-08 NOTE — Telephone Encounter (Signed)
TA-Is refill appropriate? Plz advise/thx dmf 

## 2017-03-10 MED ORDER — POTASSIUM CHLORIDE CRYS ER 10 MEQ PO TBCR: EXTENDED_RELEASE_TABLET | ORAL | 0 refills | Status: DC

## 2017-03-10 NOTE — Addendum Note (Signed)
Addended by: Lerry LinerFREDERICK, Kimmora Risenhoover. M on: 03/10/2017 02:18 PM   Modules accepted: Orders

## 2017-03-10 NOTE — Telephone Encounter (Signed)
Sent Rx in with note to pharm to advise pt to have lab drawn for next refill/put in future order for BMP per TA/thx dmf

## 2017-03-21 ENCOUNTER — Other Ambulatory Visit: Payer: Self-pay | Admitting: Family Medicine

## 2017-04-06 ENCOUNTER — Other Ambulatory Visit: Payer: Self-pay | Admitting: Family Medicine

## 2017-04-14 ENCOUNTER — Encounter: Payer: Self-pay | Admitting: Emergency Medicine

## 2017-04-14 ENCOUNTER — Other Ambulatory Visit: Payer: Self-pay

## 2017-04-14 ENCOUNTER — Emergency Department
Admission: EM | Admit: 2017-04-14 | Discharge: 2017-04-14 | Disposition: A | Discharge status: Home / Self Care | Payer: 59 | Attending: Emergency Medicine | Admitting: Emergency Medicine

## 2017-04-14 ENCOUNTER — Ambulatory Visit: Payer: Managed Care, Other (non HMO) | Admitting: Physician Assistant

## 2017-04-14 DIAGNOSIS — I10 Essential (primary) hypertension: Secondary | ICD-10-CM | POA: Insufficient documentation

## 2017-04-14 DIAGNOSIS — Z79899 Other long term (current) drug therapy: Secondary | ICD-10-CM | POA: Diagnosis not present

## 2017-04-14 DIAGNOSIS — R2232 Localized swelling, mass and lump, left upper limb: Secondary | ICD-10-CM | POA: Diagnosis present

## 2017-04-14 DIAGNOSIS — F17210 Nicotine dependence, cigarettes, uncomplicated: Secondary | ICD-10-CM | POA: Diagnosis not present

## 2017-04-14 DIAGNOSIS — L02412 Cutaneous abscess of left axilla: Secondary | ICD-10-CM | POA: Insufficient documentation

## 2017-04-14 DIAGNOSIS — L03112 Cellulitis of left axilla: Secondary | ICD-10-CM

## 2017-04-14 LAB — COMPREHENSIVE METABOLIC PANEL
ALT: 24 U/L (ref 14–54)
AST: 27 U/L (ref 15–41)
Albumin: 3.7 g/dL (ref 3.5–5.0)
Alkaline Phosphatase: 116 U/L (ref 38–126)
Anion gap: 12 (ref 5–15)
BUN: 20 mg/dL (ref 6–20)
CHLORIDE: 98 mmol/L — AB (ref 101–111)
CO2: 27 mmol/L (ref 22–32)
Calcium: 8.8 mg/dL — ABNORMAL LOW (ref 8.9–10.3)
Creatinine, Ser: 0.77 mg/dL (ref 0.44–1.00)
Glucose, Bld: 175 mg/dL — ABNORMAL HIGH (ref 65–99)
POTASSIUM: 3.5 mmol/L (ref 3.5–5.1)
Sodium: 137 mmol/L (ref 135–145)
Total Bilirubin: 0.9 mg/dL (ref 0.3–1.2)
Total Protein: 7.4 g/dL (ref 6.5–8.1)

## 2017-04-14 LAB — CBC WITH DIFFERENTIAL/PLATELET
Basophils Absolute: 0.1 10*3/uL (ref 0–0.1)
Basophils Relative: 1 %
EOS ABS: 0.3 10*3/uL (ref 0–0.7)
Eosinophils Relative: 2 %
HCT: 42.9 % (ref 35.0–47.0)
HEMOGLOBIN: 14.1 g/dL (ref 12.0–16.0)
LYMPHS ABS: 2.7 10*3/uL (ref 1.0–3.6)
LYMPHS PCT: 18 %
MCH: 27.2 pg (ref 26.0–34.0)
MCHC: 32.9 g/dL (ref 32.0–36.0)
MCV: 82.6 fL (ref 80.0–100.0)
Monocytes Absolute: 0.9 10*3/uL (ref 0.2–0.9)
Monocytes Relative: 6 %
NEUTROS PCT: 73 %
Neutro Abs: 10.7 10*3/uL — ABNORMAL HIGH (ref 1.4–6.5)
Platelets: 329 10*3/uL (ref 150–440)
RBC: 5.2 MIL/uL (ref 3.80–5.20)
RDW: 14.9 % — ABNORMAL HIGH (ref 11.5–14.5)
WBC: 14.7 10*3/uL — AB (ref 3.6–11.0)

## 2017-04-14 MED ORDER — SULFAMETHOXAZOLE-TRIMETHOPRIM 800-160 MG PO TABS
1.0000 | ORAL_TABLET | Freq: Once | ORAL | Status: AC
Start: 2017-04-14 — End: 2017-04-14
  Administered 2017-04-14: 1 via ORAL
  Filled 2017-04-14: qty 1

## 2017-04-14 MED ORDER — SULFAMETHOXAZOLE-TRIMETHOPRIM 800-160 MG PO TABS: 1.0000 | ORAL_TABLET | Freq: Two times a day (BID) | ORAL | 0 refills | Status: DC

## 2017-04-14 MED ORDER — CEPHALEXIN 500 MG PO CAPS: 500.0000 mg | ORAL_CAPSULE | Freq: Four times a day (QID) | ORAL | 0 refills | Status: AC

## 2017-04-14 MED ORDER — CEPHALEXIN 500 MG PO CAPS
500.0000 mg | ORAL_CAPSULE | Freq: Once | ORAL | Status: AC
  Administered 2017-04-14: 500 mg via ORAL
  Filled 2017-04-14: qty 1

## 2017-04-14 NOTE — ED Triage Notes (Signed)
Pt to ED from home c/o abscess and infection under left armpit.  States had abscess drained and packed on Tuesday and started on doxycycline, not getting better and added clindamycin yesterday.  Patient has redness to upper left arm that has spread past pen mark.

## 2017-04-14 NOTE — Progress Notes (Deleted)
       Patient: Sara Roy Female    DOB: 04/21/1960   57 y.o.   MRN: [id number] Visit Date: 04/14/2017  Today's Provider: Margaretann LovelessJennifer M Burnette, PA-C   No chief complaint on file.  Subjective:    HPI Sara Roy is a 57 year old female coming in today to establish care.     Allergies  Allergen Reactions  . Lisinopril     Rash   . Penicillins Hives  . Losartan Rash     Current Outpatient Medications:  .  acetaminophen (TYLENOL) 325 MG tablet, Take 2 tablets (650 mg total) by mouth every 4 (four) hours as needed for headache or mild pain. Limit ibuprofen while on aspirin and Plavix, to avoid stomach upset., Disp: , Rfl:  .  ALPRAZolam (XANAX) 0.25 MG tablet, TAKE 1 TABLET BY MOUTH THREE TIMES DAILY, Disp: 90 tablet, Rfl: 0 .  amLODipine (NORVASC) 5 MG tablet, Take 1 tablet (5 mg total) by mouth daily., Disp: 90 tablet, Rfl: 1 .  amLODipine (NORVASC) 5 MG tablet, TAKE 1 TABLET BY MOUTH DAILY, Disp: 90 tablet, Rfl: 0 .  aspirin EC 81 MG tablet, Take 1 tablet (81 mg total) by mouth daily., Disp: 90 tablet, Rfl: 3 .  atorvastatin (LIPITOR) 40 MG tablet, TAKE 1 TABLET BY MOUTH EVERY DAY, Disp: 90 tablet, Rfl: 3 .  atorvastatin (LIPITOR) 40 MG tablet, TAKE 1 TABLET BY MOUTH EVERY DAY, Disp: 90 tablet, Rfl: 0 .  diphenhydrAMINE (BENADRYL) 25 mg capsule, Take 1 capsule (25 mg total) by mouth every 6 (six) hours as needed., Disp: 30 capsule, Rfl: 0 .  fluocinonide-emollient (LIDEX-E) 0.05 % cream, Apply 1 application topically as needed (for rash)., Disp: 30 g, Rfl: 0 .  hydrochlorothiazide (HYDRODIURIL) 25 MG tablet, TAKE 1 TABLET BY MOUTH EVERY DAY, Disp: 90 tablet, Rfl: 3 .  potassium chloride (K-DUR,KLOR-CON) 10 MEQ tablet, TAKE 1 TABLET(10 MEQ) BY MOUTH DAILY, Disp: 30 tablet, Rfl: 0  Review of Systems  Social History   Tobacco Use  . Smoking status: Current Every Day Smoker    Packs/day: 1.00    Years: 40.00    Pack years: 40.00    Types: Cigarettes  . Smokeless tobacco:  Never Used  Substance Use Topics  . Alcohol use: No   Objective:   There were no vitals taken for this visit.   Physical Exam      Assessment & Plan:           Margaretann LovelessJennifer M Burnette, PA-C  [facility]

## 2017-04-14 NOTE — ED Notes (Signed)
ED Provider at bedside. 

## 2017-04-14 NOTE — Discharge Instructions (Signed)
Please stop taking doxycycline and stop taking clindamycin.  Take Bactrim and Keflex instead and follow-up in 2 days for reevaluation.  If you are unable to see her primary care physician please come back to our emergency department to have a recheck.  Return to the emergency department sooner for any new or worsening symptoms such as fevers, chills, worsening pain, or for any other issues whatsoever.  It was a pleasure to take care of you today, and thank you for coming to our emergency department.  If you have any questions or concerns before leaving please ask the nurse to grab me and I'm more than happy to go through your aftercare instructions again.  If you were prescribed any opioid pain medication today such as Norco, Vicodin, Percocet, morphine, hydrocodone, or oxycodone please make sure you do not drive when you are taking this medication as it can alter your ability to drive safely.  If you have any concerns once you are home that you are not improving or are in fact getting worse before you can make it to your follow-up appointment, please do not hesitate to call [phone] and come back for further evaluation.  Merrily BrittleNeil Emmanuelle Hibbitts, MD  Results for orders placed or performed during the hospital encounter of 04/14/17  Comprehensive metabolic panel  Result Value Ref Range   Sodium 137 135 - 145 mmol/L   Potassium 3.5 3.5 - 5.1 mmol/L   Chloride 98 (L) 101 - 111 mmol/L   CO2 27 22 - 32 mmol/L   Glucose, Bld 175 (H) 65 - 99 mg/dL   BUN 20 6 - 20 mg/dL   Creatinine, Ser 0.980.77 0.44 - 1.00 mg/dL   Calcium 8.8 (L) 8.9 - 10.3 mg/dL   Total Protein 7.4 6.5 - 8.1 g/dL   Albumin 3.7 3.5 - 5.0 g/dL   AST 27 15 - 41 U/L   ALT 24 14 - 54 U/L   Alkaline Phosphatase 116 38 - 126 U/L   Total Bilirubin 0.9 0.3 - 1.2 mg/dL   GFR calc non Af Amer >60 >60 mL/min   GFR calc Af Amer >60 >60 mL/min   Anion gap 12 5 - 15  CBC with Differential  Result Value Ref Range   WBC 14.7 (H) 3.6 - 11.0 K/uL   RBC 5.20  3.80 - 5.20 MIL/uL   Hemoglobin 14.1 12.0 - 16.0 g/dL   HCT 11.942.9 14.735.0 - 82.947.0 %   MCV 82.6 80.0 - 100.0 fL   MCH 27.2 26.0 - 34.0 pg   MCHC 32.9 32.0 - 36.0 g/dL   RDW 56.214.9 (H) 13.011.5 - 86.514.5 %   Platelets 329 150 - 440 K/uL   Neutrophils Relative % 73 %   Neutro Abs 10.7 (H) 1.4 - 6.5 K/uL   Lymphocytes Relative 18 %   Lymphs Abs 2.7 1.0 - 3.6 K/uL   Monocytes Relative 6 %   Monocytes Absolute 0.9 0.2 - 0.9 K/uL   Eosinophils Relative 2 %   Eosinophils Absolute 0.3 0 - 0.7 K/uL   Basophils Relative 1 %   Basophils Absolute 0.1 0 - 0.1 K/uL

## 2017-04-14 NOTE — ED Notes (Signed)
Only CBC and CMP ordered per Dr. Lamont Snowballifenbark.  1 set of blood cultures sent with save labels.

## 2017-04-14 NOTE — ED Provider Notes (Signed)
[facility] Emergency Department Provider Note  ____________________________________________   First MD Initiated Contact with Patient 04/14/17 1234     (approximate)  I have reviewed the triage vital signs and the nursing notes.   HISTORY  Chief Complaint Abscess   HPI Sara Roy is a 57 y.o. female who sent to the emergency department by urgent care for evaluation of worsening cellulitis to her left axilla.  She initially began with redness warmth and swelling to her left axilla and was treated with doxycycline and incision and drainage and her abscess was packed several days ago.  She represented today and was switched to clindamycin instead of doxycycline but it was noted that the swelling around her left axilla had progressed so she was advised to come to the emergency department.  The patient feels like her symptoms are actually improving.  She denies fevers or chills.  She had mild to moderate throbbing aching burning discomfort in her left axilla nonradiating.  It was worsened with movement improved with rest.  She denies worsening discharge from her axilla.  Past Medical History:  Diagnosis Date  . Anemia   . Anxiety   . Family history of early CAD   . HOH (hard of hearing)    reads lips wears hearing aids  . Hyperlipidemia   . Hypertension   . Peripheral vascular disease (HCC)   . Tobacco abuse     Patient Active Problem List   Diagnosis Date Noted  . Sebaceous cyst 06/16/2016  . Angiodysplasia of intestine with hemorrhage   . Rectal polyp   . Anemia 10/02/2015  . Vitamin D deficiency 10/02/2015  . Family history of hemochromatosis 03/10/2015  . Cardiac murmur 07/25/2014  . PAD (peripheral artery disease) (HCC) 10/10/2013  . Peripheral arterial disease (HCC) 09/14/2013  . Exertional dyspnea 09/14/2013  . Tobacco use 09/14/2013  . Hyperlipidemia   . Leukocytosis 08/18/2011  . Elevated hemoglobin (HCC) 08/18/2011  . Anxiety   .  Hypertension     Past Surgical History:  Procedure Laterality Date  . ABDOMINAL AORTAGRAM N/A 10/10/2013   Procedure: ABDOMINAL Ronny Flurry;  Surgeon: Iran Ouch, MD;  Location: [facility] CATH LAB;  Service: Cardiovascular;  Laterality: N/A;  . ABDOMINAL HYSTERECTOMY    . CHOLECYSTECTOMY    . COLONOSCOPY WITH PROPOFOL N/A 10/16/2015   Procedure: COLONOSCOPY WITH PROPOFOL;  Surgeon: Midge Minium, MD;  Location: [facility];  Service: Endoscopy;  Laterality: N/A;  . LOWER EXTREMITY ANGIOGRAM Bilateral 10/10/2013   ILIAC   BILATERAL   BY DR ARDA  . POLYPECTOMY  10/16/2015   Procedure: POLYPECTOMY;  Surgeon: Midge Minium, MD;  Location: [facility];  Service: Endoscopy;;  . SKIN CANCER EXCISION      Prior to Admission medications   Medication Sig Start Date End Date Taking? Authorizing Provider  acetaminophen (TYLENOL) 325 MG tablet Take 2 tablets (650 mg total) by mouth every 4 (four) hours as needed for headache or mild pain. Limit ibuprofen while on aspirin and Plavix, to avoid stomach upset. 10/11/13   Barrett, Joline Salt, PA-C  ALPRAZolam (XANAX) 0.25 MG tablet TAKE 1 TABLET BY MOUTH THREE TIMES DAILY 02/17/17   Dianne Dun, MD  amLODipine (NORVASC) 5 MG tablet Take 1 tablet (5 mg total) by mouth daily. 06/28/16   Dianne Dun, MD  amLODipine (NORVASC) 5 MG tablet TAKE 1 TABLET BY MOUTH DAILY 03/22/17   Dianne Dun, MD  aspirin EC 81 MG tablet Take 1  tablet (81 mg total) by mouth daily. 10/26/13   Iran OuchArida, Muhammad A, MD  atorvastatin (LIPITOR) 40 MG tablet TAKE 1 TABLET BY MOUTH EVERY DAY 08/14/15   Iran OuchArida, Muhammad A, MD  atorvastatin (LIPITOR) 40 MG tablet TAKE 1 TABLET BY MOUTH EVERY DAY 02/21/17   Iran OuchArida, Muhammad A, MD  cephALEXin (KEFLEX) 500 MG capsule Take 1 capsule (500 mg total) by mouth 4 (four) times daily for 10 days. 04/14/17 04/24/17  Merrily Brittleifenbark, Lamon Rotundo, MD  diphenhydrAMINE (BENADRYL) 25 mg capsule Take 1 capsule (25 mg total) by mouth every 6 (six) hours as needed. 02/17/16   Dianne DunAron,  Talia M, MD  fluocinonide-emollient (LIDEX-E) 0.05 % cream Apply 1 application topically as needed (for rash). 11/20/14   Dianne DunAron, Talia M, MD  hydrochlorothiazide (HYDRODIURIL) 25 MG tablet TAKE 1 TABLET BY MOUTH EVERY DAY 08/02/16   Dianne DunAron, Talia M, MD  potassium chloride (K-DUR,KLOR-CON) 10 MEQ tablet TAKE 1 TABLET(10 MEQ) BY MOUTH DAILY 03/10/17   Dianne DunAron, Talia M, MD  sulfamethoxazole-trimethoprim (BACTRIM DS,SEPTRA DS) 800-160 MG tablet Take 1 tablet by mouth 2 (two) times daily. 04/14/17   Merrily Brittleifenbark, Ivon Roedel, MD    Allergies Lisinopril; Penicillins; and Losartan  Family History  Problem Relation Age of Onset  . Heart disease Father     Social History Social History   Tobacco Use  . Smoking status: Current Every Day Smoker    Packs/day: 1.50    Years: 40.00    Pack years: 60.00    Types: Cigarettes  . Smokeless tobacco: Never Used  Substance Use Topics  . Alcohol use: No  . Drug use: Yes    Types: Marijuana    Review of Systems Constitutional: No fever/chills Eyes: No visual changes. ENT: No sore throat. Cardiovascular: Denies chest pain. Respiratory: Denies shortness of breath. Gastrointestinal: No abdominal pain.  No nausea, no vomiting.  No diarrhea.  No constipation. Genitourinary: Negative for dysuria. Musculoskeletal: Negative for back pain. Skin: Positive for rash. Neurological: Negative for headaches, focal weakness or numbness.   ____________________________________________   PHYSICAL EXAM:  VITAL SIGNS: ED Triage Vitals  Enc Vitals Group     BP 04/14/17 1133 (!) 159/77     Pulse Rate 04/14/17 1133 79     Resp --      Temp 04/14/17 1133 98.3 F (36.8 C)     Temp Source 04/14/17 1133 Oral     SpO2 04/14/17 1133 96 %     Weight 04/14/17 1137 230 lb (104.3 kg)     Height 04/14/17 1137 5\' 7"  (1.702 m)     Head Circumference --      Peak Flow --      Pain Score 04/14/17 1137 2     Pain Loc --      Pain Edu? --      Excl. in GC? --     Constitutional:  Alert and oriented x4 well-appearing nontoxic no diaphoresis speaks full clear sentences Eyes: PERRL EOMI. Head: Atraumatic. Nose: No congestion/rhinnorhea. Mouth/Throat: No trismus Neck: No stridor.   Cardiovascular: Normal rate, regular rhythm. Grossly normal heart sounds.  Good peripheral circulation. Respiratory: Normal respiratory effort.  No retractions. Lungs CTAB and moving good air Gastrointestinal: Obese soft nontender Musculoskeletal: No lower extremity edema   Neurologic:  Normal speech and language. No gross focal neurologic deficits are appreciated. Skin: Roughly 10 cm area of erythema and warmth with no bulla blister sloughing and only minimal tenderness to left axilla.  Small incision made for an abscess with no  drainage Psychiatric: Mood and affect are normal. Speech and behavior are normal.    ____________________________________________   DIFFERENTIAL includes but not limited to  Abscess, cellulitis, necrotizing soft tissue infection ____________________________________________   LABS (all labs ordered are listed, but only abnormal results are displayed)  Labs Reviewed  COMPREHENSIVE METABOLIC PANEL - Abnormal; Notable for the following components:      Result Value   Chloride 98 (*)    Glucose, Bld 175 (*)    Calcium 8.8 (*)    All other components within normal limits  CBC WITH DIFFERENTIAL/PLATELET - Abnormal; Notable for the following components:   WBC 14.7 (*)    RDW 14.9 (*)    Neutro Abs 10.7 (*)    All other components within normal limits    Lab work reviewed by me with elevated white count which is nonspecific __________________________________________  EKG   ____________________________________________  RADIOLOGY   ____________________________________________   PROCEDURES  Procedure(s) performed: no  Procedures  Critical Care performed: no  Observation: no ____________________________________________   INITIAL IMPRESSION  / ASSESSMENT AND PLAN / ED COURSE  Pertinent labs & imaging results that were available during my care of the patient were reviewed by me and considered in my medical decision making (see chart for details).  Patient arrives to the emergency department afebrile and very well-appearing.  No tachycardia.  I appreciate that her erythema has progressed outside of the lines that were drawn the other day however she has not been taking appropriate antibiotics as I would expect both doxycycline and clindamycin to have a relatively high failure rates.  Given first dose of Keflex and Bactrim DS here in the emergency department today.  2-day wound check will be given and strict return precautions have been given.  The abscess does not need to be opened any further.  She is discharged home in improved condition verbalized understanding agree with the plan.      ____________________________________________   FINAL CLINICAL IMPRESSION(S) / ED DIAGNOSES  Final diagnoses:  Abscess of left axilla  Cellulitis of left axilla      NEW MEDICATIONS STARTED DURING THIS VISIT:  Discharge Medication List as of 04/14/2017  1:08 PM    START taking these medications   Details  cephALEXin (KEFLEX) 500 MG capsule Take 1 capsule (500 mg total) by mouth 4 (four) times daily for 10 days., Starting Thu 04/14/2017, Until Sun 04/24/2017, Print    sulfamethoxazole-trimethoprim (BACTRIM DS,SEPTRA DS) 800-160 MG tablet Take 1 tablet by mouth 2 (two) times daily., Starting Thu 04/14/2017, Print         Note:  This document was prepared using Dragon voice recognition software and may include unintentional dictation errors.     Merrily Brittle, MD 04/14/17 1524

## 2017-04-29 ENCOUNTER — Other Ambulatory Visit: Payer: Self-pay | Admitting: Family Medicine

## 2017-05-02 ENCOUNTER — Other Ambulatory Visit: Payer: Self-pay | Admitting: Family Medicine

## 2017-05-02 ENCOUNTER — Other Ambulatory Visit: Payer: Self-pay

## 2017-05-10 ENCOUNTER — Encounter: Payer: Self-pay | Admitting: Physician Assistant

## 2017-05-10 ENCOUNTER — Ambulatory Visit: Payer: 59 | Admitting: Physician Assistant

## 2017-05-10 VITALS — BP 160/80 | HR 72 | Temp 98.4°F | Resp 16 | Ht 67.0 in | Wt 234.8 lb

## 2017-05-10 DIAGNOSIS — J440 Chronic obstructive pulmonary disease with acute lower respiratory infection: Secondary | ICD-10-CM

## 2017-05-10 DIAGNOSIS — L723 Sebaceous cyst: Secondary | ICD-10-CM

## 2017-05-10 DIAGNOSIS — J209 Acute bronchitis, unspecified: Secondary | ICD-10-CM

## 2017-05-10 DIAGNOSIS — I10 Essential (primary) hypertension: Secondary | ICD-10-CM

## 2017-05-10 MED ORDER — PREDNISONE 10 MG PO TABS: ORAL_TABLET | ORAL | 0 refills | Status: DC

## 2017-05-10 NOTE — Progress Notes (Signed)
Patient: Sara Roy Female    DOB: 01/01/1961   57 y.o.   MRN: [id number] Visit Date: 05/10/2017  Today's Provider: Margaretann LovelessJennifer M Burnette, PA-C   Chief Complaint  Patient presents with  . Establish Care   Subjective:    HPI Patient here today to establish care and medications refill. She is followed by Dr.Arida for her heart murmur and Peripheral artery disease. Patient has trouble hearing she is here with her niece Sara Roy.  Patient also c/o chest congestion going on for a while. Saturation today is 92-94% RA. Symptoms:Chest tightness,SOB,cough,congestion and Wheezing.  Patient also reports that she had cyst and was on two different antibiotics and wants provider to check.    Allergies  Allergen Reactions  . Lisinopril     Rash   . Penicillins Hives  . Losartan Rash     Current Outpatient Medications:  .  ALPRAZolam (XANAX) 0.25 MG tablet, TAKE 1 TABLET BY MOUTH THREE TIMES DAILY, Disp: 90 tablet, Rfl: 2 .  amLODipine (NORVASC) 5 MG tablet, Take 1 tablet (5 mg total) by mouth daily., Disp: 90 tablet, Rfl: 1 .  aspirin EC 81 MG tablet, Take 1 tablet (81 mg total) by mouth daily., Disp: 90 tablet, Rfl: 3 .  atorvastatin (LIPITOR) 40 MG tablet, TAKE 1 TABLET BY MOUTH EVERY DAY, Disp: 90 tablet, Rfl: 3 .  hydrochlorothiazide (HYDRODIURIL) 25 MG tablet, TAKE 1 TABLET BY MOUTH EVERY DAY, Disp: 90 tablet, Rfl: 3 .  potassium chloride (K-DUR) 10 MEQ tablet, TAKE 1 TABLET(10 MEQ) BY MOUTH DAILY, Disp: 30 tablet, Rfl: 0 .  acetaminophen (TYLENOL) 325 MG tablet, Take 2 tablets (650 mg total) by mouth every 4 (four) hours as needed for headache or mild pain. Limit ibuprofen while on aspirin and Plavix, to avoid stomach upset. (Patient not taking: Reported on 05/10/2017), Disp: , Rfl:  .  diphenhydrAMINE (BENADRYL) 25 mg capsule, Take 1 capsule (25 mg total) by mouth every 6 (six) hours as needed. (Patient not taking: Reported on 05/10/2017), Disp: 30 capsule, Rfl: 0 .   fluocinonide-emollient (LIDEX-E) 0.05 % cream, Apply 1 application topically as needed (for rash). (Patient not taking: Reported on 05/10/2017), Disp: 30 g, Rfl: 0 .  sulfamethoxazole-trimethoprim (BACTRIM DS,SEPTRA DS) 800-160 MG tablet, Take 1 tablet by mouth 2 (two) times daily. (Patient not taking: Reported on 05/10/2017), Disp: 20 tablet, Rfl: 0  Review of Systems  Constitutional: Positive for activity change and fatigue.  HENT: Positive for congestion.   Eyes: Negative.   Respiratory: Positive for cough, chest tightness, shortness of breath and wheezing.   Cardiovascular: Negative.   Gastrointestinal: Negative.   Endocrine: Negative.   Genitourinary: Negative.   Musculoskeletal: Negative.   Skin: Negative.   Allergic/Immunologic: Negative.   Neurological: Positive for headaches.  Hematological: Negative.   Psychiatric/Behavioral: Positive for sleep disturbance.    Social History   Tobacco Use  . Smoking status: Current Every Day Smoker    Packs/day: 1.50    Years: 40.00    Pack years: 60.00    Types: Cigarettes  . Smokeless tobacco: Never Used  Substance Use Topics  . Alcohol use: No    Comment: Quit 6 years ago   Objective:   BP (!) 160/80 (BP Location: Left Arm, Patient Position: Sitting, Cuff Size: Normal)   Pulse 72   Temp 98.4 F (36.9 C) (Oral)   Resp 16   Ht 5\' 7"  (1.702 m)   Wt 234 lb 12.8 oz (106.5 kg)  SpO2 94%   BMI 36.77 kg/m    Physical Exam  Constitutional: She appears well-developed and well-nourished. No distress.  HENT:  Head: Normocephalic and atraumatic.  Right Ear: Tympanic membrane, external ear and ear canal normal. Decreased hearing is noted.  Left Ear: Tympanic membrane, external ear and ear canal normal. Decreased hearing is noted.  Nose: Nose normal.  Mouth/Throat: Uvula is midline, oropharynx is clear and moist and mucous membranes are normal. No oropharyngeal exudate.  Eyes: Pupils are equal, round, and reactive to light.  Conjunctivae are normal. Right eye exhibits no discharge. Left eye exhibits no discharge. No scleral icterus.  Neck: Normal range of motion. Neck supple. No tracheal deviation present. No thyromegaly present.  Cardiovascular: Normal rate and regular rhythm. Exam reveals no gallop and no friction rub.  Murmur heard. Pulmonary/Chest: Effort normal. No stridor. No respiratory distress. She has no decreased breath sounds. She has no wheezes. She has rhonchi (heard anterior chest wall). She has no rales.  Lymphadenopathy:    She has no cervical adenopathy.  Skin: Skin is warm and dry. She is not diaphoretic.  Vitals reviewed.      Assessment & Plan:     1. Encounter to establish care Previously followed by Dr. Dayton Martes with [facility].  2. Acute bronchitis with COPD (HCC) Worsening. Not on any inhalers at this time. Will give prednisone taper as below. I will see her back in 2 weeks. If improved will attempt PFT at this time as well and start a daily inhaler. She is to call if symptoms worsen.  - predniSONE (DELTASONE) 10 MG tablet; Take 6 tabs PO on day 1&2, 5 tabs PO on day 3&4, 4 tabs PO on day 5&6, 3 tabs PO on day 7&8, 2 tabs PO on day 9&10, 1 tab PO on day 11&12.  Dispense: 42 tablet; Refill: 0  3. Sebaceous cyst of left axilla Improved.  4. Essential hypertension Elevated today. Will recheck in 2 weeks and if still elevated will consider increasing amlodipine to 10mg .        Margaretann Loveless, PA-C  [facility]

## 2017-05-10 NOTE — Patient Instructions (Signed)

## 2017-05-16 ENCOUNTER — Other Ambulatory Visit: Payer: Self-pay | Admitting: Cardiovascular Disease

## 2017-05-24 ENCOUNTER — Encounter: Payer: Self-pay | Admitting: Physician Assistant

## 2017-05-24 ENCOUNTER — Ambulatory Visit: Payer: 59 | Admitting: Physician Assistant

## 2017-05-24 VITALS — BP 142/70 | HR 76 | Temp 98.4°F | Resp 16 | Ht 67.0 in | Wt 231.0 lb

## 2017-05-24 DIAGNOSIS — J301 Allergic rhinitis due to pollen: Secondary | ICD-10-CM | POA: Diagnosis not present

## 2017-05-24 DIAGNOSIS — R351 Nocturia: Secondary | ICD-10-CM | POA: Diagnosis not present

## 2017-05-24 DIAGNOSIS — B370 Candidal stomatitis: Secondary | ICD-10-CM | POA: Diagnosis not present

## 2017-05-24 DIAGNOSIS — J418 Mixed simple and mucopurulent chronic bronchitis: Secondary | ICD-10-CM | POA: Diagnosis not present

## 2017-05-24 DIAGNOSIS — R0602 Shortness of breath: Secondary | ICD-10-CM

## 2017-05-24 MED ORDER — ALBUTEROL SULFATE HFA 108 (90 BASE) MCG/ACT IN AERS: 2.0000 | INHALATION_SPRAY | Freq: Four times a day (QID) | RESPIRATORY_TRACT | 0 refills | Status: DC | PRN

## 2017-05-24 MED ORDER — LORATADINE 10 MG PO TABS: 10.0000 mg | ORAL_TABLET | Freq: Every day | ORAL | 11 refills | Status: DC

## 2017-05-24 MED ORDER — FLUCONAZOLE 150 MG PO TABS: 150.0000 mg | ORAL_TABLET | Freq: Every day | ORAL | 0 refills | Status: DC

## 2017-05-24 MED ORDER — FLUTICASONE FUROATE-VILANTEROL 100-25 MCG/INH IN AEPB: 1.0000 | INHALATION_SPRAY | Freq: Every day | RESPIRATORY_TRACT | 5 refills | Status: DC

## 2017-05-24 MED ORDER — OXYBUTYNIN CHLORIDE ER 5 MG PO TB24: 5.0000 mg | ORAL_TABLET | Freq: Every day | ORAL | 0 refills | Status: DC

## 2017-05-24 NOTE — Progress Notes (Signed)
Patient: Sara Roy Female    DOB: 24-Jun-1960   57 y.o.   MRN: [id number] Visit Date: 05/24/2017  Today's Provider: Margaretann Loveless, PA-C   Chief Complaint  Patient presents with  . Hypertension   Subjective:    HPI  Hypertension, follow-up:  BP Readings from Last 3 Encounters:  05/24/17 (!) 142/70  05/10/17 (!) 160/80  04/14/17 (!) 154/70    She was last seen for hypertension 2 weeks ago.  BP at that visit was 160/80. Management since that visit includes no change. She reports good compliance with treatment. She is not having side effects.  She is not exercising. She is adherent to low salt diet.   Outside blood pressures are not being checked. She is experiencing dyspnea and fatigue.  Patient denies exertional chest pressure/discomfort and palpitations.   Cardiovascular risk factors include dyslipidemia.   Weight trend: stable Wt Readings from Last 3 Encounters:  05/24/17 231 lb (104.8 kg)  05/10/17 234 lb 12.8 oz (106.5 kg)  04/14/17 230 lb (104.3 kg)    Current diet: well balanced   Cough, follow up: Patient reports that she is still not feeling well. She was treated with prednisone 12 day taper which she has already completed. She describes it as a heaviness on her chest.   She is also having frequent nocturia. She had a list of the times she was awoken last night for the bathroom. She was up almost every hour. She also has vivid dreams and will act out motions in her sleep. She takes Xanax prn for sleep but states this is worsening.   Allergies  Allergen Reactions  . Lisinopril     Rash   . Penicillins Hives  . Losartan Rash     Current Outpatient Medications:  .  ALPRAZolam (XANAX) 0.25 MG tablet, TAKE 1 TABLET BY MOUTH THREE TIMES DAILY, Disp: 90 tablet, Rfl: 2 .  amLODipine (NORVASC) 5 MG tablet, Take 1 tablet (5 mg total) by mouth daily., Disp: 90 tablet, Rfl: 1 .  aspirin EC 81 MG tablet, Take 1 tablet (81 mg total) by mouth  daily., Disp: 90 tablet, Rfl: 3 .  atorvastatin (LIPITOR) 40 MG tablet, TAKE 1 TABLET BY MOUTH EVERY DAY, Disp: 90 tablet, Rfl: 3 .  atorvastatin (LIPITOR) 40 MG tablet, TAKE 1 TABLET BY MOUTH EVERY DAY, Disp: 90 tablet, Rfl: 3 .  hydrochlorothiazide (HYDRODIURIL) 25 MG tablet, TAKE 1 TABLET BY MOUTH EVERY DAY, Disp: 90 tablet, Rfl: 3 .  potassium chloride (K-DUR) 10 MEQ tablet, TAKE 1 TABLET(10 MEQ) BY MOUTH DAILY, Disp: 30 tablet, Rfl: 0 .  acetaminophen (TYLENOL) 325 MG tablet, Take 2 tablets (650 mg total) by mouth every 4 (four) hours as needed for headache or mild pain. Limit ibuprofen while on aspirin and Plavix, to avoid stomach upset. (Patient not taking: Reported on 05/10/2017), Disp: , Rfl:  .  diphenhydrAMINE (BENADRYL) 25 mg capsule, Take 1 capsule (25 mg total) by mouth every 6 (six) hours as needed. (Patient not taking: Reported on 05/10/2017), Disp: 30 capsule, Rfl: 0 .  fluocinonide-emollient (LIDEX-E) 0.05 % cream, Apply 1 application topically as needed (for rash). (Patient not taking: Reported on 05/10/2017), Disp: 30 g, Rfl: 0 .  predniSONE (DELTASONE) 10 MG tablet, Take 6 tabs PO on day 1&2, 5 tabs PO on day 3&4, 4 tabs PO on day 5&6, 3 tabs PO on day 7&8, 2 tabs PO on day 9&10, 1 tab PO on day 11&12. (  Patient not taking: Reported on 05/24/2017), Disp: 42 tablet, Rfl: 0  Review of Systems  Constitutional: Positive for activity change, chills, fatigue and fever.  HENT: Positive for congestion, postnasal drip and sore throat.   Respiratory: Positive for cough, shortness of breath and wheezing. Negative for chest tightness.   Cardiovascular: Negative for chest pain, palpitations and leg swelling.  Musculoskeletal: Positive for myalgias.  Allergic/Immunologic: Positive for environmental allergies.  Neurological: Positive for dizziness, light-headedness and headaches.    Social History   Tobacco Use  . Smoking status: Current Every Day Smoker    Packs/day: 1.50    Years: 40.00     Pack years: 60.00    Types: Cigarettes  . Smokeless tobacco: Never Used  Substance Use Topics  . Alcohol use: No    Comment: Quit 6 years ago   Objective:   BP (!) 142/70 (BP Location: Left Arm, Patient Position: Sitting, Cuff Size: Large)   Pulse 76   Temp 98.4 F (36.9 C)   Resp 16   Ht 5\' 7"  (1.702 m)   Wt 231 lb (104.8 kg)   SpO2 97%   BMI 36.18 kg/m  Vitals:   05/24/17 0807  BP: (!) 142/70  Pulse: 76  Resp: 16  Temp: 98.4 F (36.9 C)  SpO2: 97%  Weight: 231 lb (104.8 kg)  Height: 5\' 7"  (1.702 m)     Physical Exam  Constitutional: She appears well-developed and well-nourished. No distress.  HENT:  Head: Normocephalic and atraumatic.  Right Ear: Hearing, tympanic membrane, external ear and ear canal normal.  Left Ear: Hearing, tympanic membrane, external ear and ear canal normal.  Nose: Nose normal.  Mouth/Throat: Uvula is midline, oropharynx is clear and moist and mucous membranes are normal. No oropharyngeal exudate.  Eyes: Pupils are equal, round, and reactive to light. Conjunctivae are normal. Right eye exhibits no discharge. Left eye exhibits no discharge. No scleral icterus.  Neck: Normal range of motion. Neck supple. No tracheal deviation present. No thyromegaly present.  Cardiovascular: Normal rate, regular rhythm and normal heart sounds. Exam reveals no gallop and no friction rub.  No murmur heard. Pulmonary/Chest: Effort normal and breath sounds normal. No stridor. No respiratory distress. She has no wheezes. She has no rales.  Lymphadenopathy:    She has no cervical adenopathy.  Skin: Skin is warm and dry. She is not diaphoretic.  Vitals reviewed.       Assessment & Plan:     1. Mixed simple and mucopurulent chronic bronchitis (HCC) Spirometry showed FEV1 of 60% and FVC of 59% with normal FEV1/FVC ratio indicating moderately severe restriction. Will start Breo daily as below. Albuterol for prn use. I will see her back in 4 weeks to recheck.  -  albuterol (PROVENTIL HFA;VENTOLIN HFA) 108 (90 Base) MCG/ACT inhaler; Inhale 2 puffs into the lungs every 6 (six) hours as needed for wheezing or shortness of breath.  Dispense: 1 Inhaler; Refill: 0 - fluticasone furoate-vilanterol (BREO ELLIPTA) 100-25 MCG/INH AEPB; Inhale 1 puff into the lungs daily.  Dispense: 60 each; Refill: 5  2. Shortness of breath See above medical treatment plan. - Spirometry with graph  3. Seasonal allergic rhinitis due to pollen Stable. Diagnosis pulled for medication refill. Continue current medical treatment plan. - loratadine (CLARITIN) 10 MG tablet; Take 1 tablet (10 mg total) by mouth daily.  Dispense: 30 tablet; Refill: 11  4. Thrush From recent oral steroid use. Diflucan sent as below. She is to call if no improvements and will  send in Nystatin susp.  - fluconazole (DIFLUCAN) 150 MG tablet; Take 1 tablet (150 mg total) by mouth daily.  Dispense: 2 tablet; Refill: 0  5. Nocturia Worsening. Will try oxybutynin as below. I will see her back in 4 weeks to see how it is helping. Discussed not having anything to drink 2 hours before bedtime. If it is helping nocturia but still having issues with sleep will change the xanax to something else for better sleep.  - oxybutynin (DITROPAN-XL) 5 MG 24 hr tablet; Take 1 tablet (5 mg total) by mouth at bedtime.  Dispense: 30 tablet; Refill: 0       Margaretann LovelessJennifer M Kaprice Kage, PA-C  [facility]

## 2017-05-24 NOTE — Patient Instructions (Signed)
Albuterol inhalation aerosol (ProAir)- every 4-6 hours as needed What is this medicine? ALBUTEROL (al Gaspar Bidding) is a bronchodilator. It helps open up the airways in your lungs to make it easier to breathe. This medicine is used to treat and to prevent bronchospasm. This medicine may be used for other purposes; ask your health care provider or pharmacist if you have questions. COMMON BRAND NAME(S): Proair HFA, Proventil, Proventil HFA, Respirol, Ventolin, Ventolin HFA What should I tell my health care provider before I take this medicine? They need to know if you have any of the following conditions: -diabetes -heart disease or irregular heartbeat -high blood pressure -pheochromocytoma -seizures -thyroid disease -an unusual or allergic reaction to albuterol, levalbuterol, sulfites, other medicines, foods, dyes, or preservatives -pregnant or trying to get pregnant -breast-feeding How should I use this medicine? This medicine is for inhalation through the mouth. Follow the directions on your prescription label. Take your medicine at regular intervals. Do not use more often than directed. Make sure that you are using your inhaler correctly. Ask you doctor or health care provider if you have any questions. Talk to your pediatrician regarding the use of this medicine in children. Special care may be needed. Overdosage: If you think you have taken too much of this medicine contact a poison control center or emergency room at once. NOTE: This medicine is only for you. Do not share this medicine with others. What if I miss a dose? If you miss a dose, use it as soon as you can. If it is almost time for your next dose, use only that dose. Do not use double or extra doses. What may interact with this medicine? -anti-infectives like chloroquine and pentamidine -caffeine -cisapride -diuretics -medicines for colds -medicines for depression or for emotional or psychotic conditions -medicines for  weight loss including some herbal products -methadone -some antibiotics like clarithromycin, erythromycin, levofloxacin, and linezolid -some heart medicines -steroid hormones like dexamethasone, cortisone, hydrocortisone -theophylline -thyroid hormones This list may not describe all possible interactions. Give your health care provider a list of all the medicines, herbs, non-prescription drugs, or dietary supplements you use. Also tell them if you smoke, drink alcohol, or use illegal drugs. Some items may interact with your medicine. What should I watch for while using this medicine? Tell your doctor or health care professional if your symptoms do not improve. Do not use extra albuterol. If your asthma or bronchitis gets worse while you are using this medicine, call your doctor right away. If your mouth gets dry try chewing sugarless gum or sucking hard candy. Drink water as directed. What side effects may I notice from receiving this medicine? Side effects that you should report to your doctor or health care professional as soon as possible: -allergic reactions like skin rash, itching or hives, swelling of the face, lips, or tongue -breathing problems -chest pain -feeling faint or lightheaded, falls -high blood pressure -irregular heartbeat -fever -muscle cramps or weakness -pain, tingling, numbness in the hands or feet -vomiting Side effects that usually do not require medical attention (report to your doctor or health care professional if they continue or are bothersome): -cough -difficulty sleeping -headache -nervousness or trembling -stomach upset -stuffy or runny nose -throat irritation -unusual taste This list may not describe all possible side effects. Call your doctor for medical advice about side effects. You may report side effects to FDA at 1-800-FDA-1088. Where should I keep my medicine? Keep out of the reach of children. Store at room  temperature between 15 and 30  degrees C (59 and 86 degrees F). The contents are under pressure and may burst when exposed to heat or flame. Do not freeze. This medicine does not work as well if it is too cold. Throw away any unused medicine after the expiration date. Inhalers need to be thrown away after the labeled number of puffs have been used or by the expiration date; whichever comes first. Ventolin HFA should be thrown away 12 months after removing from foil pouch. Check the instructions that come with your medicine. NOTE: This sheet is a summary. It may not cover all possible information. If you have questions about this medicine, talk to your doctor, pharmacist, or health care provider.  2018 Elsevier/Gold Standard (2012-07-13 10:57:17)  Fluticasone; Vilanterol inhalation powder (Breo)-once daily What is this medicine? FLUTICASONE; VILANTEROL (floo TIK a sone; vye LAN ter ol) inhalation is a combination of two medicines that decrease inflammation and help to open up the airways of your lungs. It is for chronic obstructive pulmonary disease (COPD), including chronic bronchitis or emphysema. It is also used for asthma in adults to help control symptoms. Do NOT use for an acute asthma attack or COPD attack. This medicine may be used for other purposes; ask your health care provider or pharmacist if you have questions. COMMON BRAND NAME(S): BREO ELLIPTA What should I tell my health care provider before I take this medicine? They need to know if you have any of these conditions: -bone problems -immune system problems -diabetes -heart disease or irregular heartbeat -high blood pressure -infection -pheochromocytoma -seizures -thyroid disease -an unusual or allergic reaction to fluticasone, vilanterol, milk proteins, corticosteroids, other medicines, foods, dyes, or preservatives -pregnant or trying to get pregnant -breast-feeding How should I use this medicine? This medicine is inhaled through the mouth. It is used once  per day. Follow the directions on the prescription label. Do not use a spacer device with this inhaler. Take your medicine at regular intervals. Do not take your medicine more often than directed. Do not stop taking except on your doctor's advice. Make sure that you are using your inhaler correctly. Ask you doctor or health care provider if you have any questions. A special MedGuide will be given to you by the pharmacist with each prescription and refill. Be sure to read this information carefully each time. Talk to your pediatrician regarding the use of this medicine in children. Special care may be needed. This medicine is not approved for use in children under 32 years of age. Overdosage: If you think you have taken too much of this medicine contact a poison control center or emergency room at once. NOTE: This medicine is only for you. Do not share this medicine with others. What if I miss a dose? If you miss a dose, use it as soon as you can. If it is almost time for your next dose, use only that dose and continue with your regular schedule. Do not use double or extra doses. What may interact with this medicine? Do not take this medicine with any of the following medications: -cisapride -dofetilide -dronedarone -MAOIs like Carbex, Eldepryl, Marplan, Nardil, and Parnate -pimozide -thioridazine -ziprasidone This medicine may also interact with the following medications: -antiviral medicines for HIV or AIDS -beta-blockers like metoprolol and propranolol -certain medicines for depression, anxiety, or psychotic disturbances -certain medicines for fungal infections like ketoconazole, itraconazole, posaconazole, voriconazole -conivaptan -diuretics -medicines for colds -nefazodone -other medicines for breathing problems -other medicines that prolong the  QT interval (cause an abnormal heart rhythm) This list may not describe all possible interactions. Give your health care provider a list of  all the medicines, herbs, non-prescription drugs, or dietary supplements you use. Also tell them if you smoke, drink alcohol, or use illegal drugs. Some items may interact with your medicine. What should I watch for while using this medicine? Visit your doctor or health care professional for regular checkups. Tell your doctor or health care professional if your symptoms do not get better. Do not use this medicine more than once every 24 hours. NEVER use this medicine for an acute asthma or COPD attack. You should use your short-acting rescue inhalers for this purpose. If your symptoms get worse or if you need your short-acting inhalers more often, call your doctor right away. If you are going to have surgery tell your doctor or health care professional that you are using this medicine. Try not to come in contact with people with the chicken pox or measles. If you do, call your doctor. What side effects may I notice from receiving this medicine? Side effects that you should report to your doctor or health care professional as soon as possible: -allergic reactions like skin rash or hives, swelling of the face, lips, or tongue -breathing problems right after inhaling your medicine -changes in vision -chest pain -fast, irregular heartbeat -feeling faint or lightheaded, falls -fever or chills -nausea, vomiting -tiredness Side effects that usually do not require medical attention (report to your doctor or health care professional if they continue or are bothersome): -cough -headache -nervousness -sore throat -tremor This list may not describe all possible side effects. Call your doctor for medical advice about side effects. You may report side effects to FDA at 1-800-FDA-1088. Where should I keep my medicine? Keep out of the reach of children. Store at room temperature between 15 and 30 degrees C (59 and 86 degrees F). Store in a dry place away from direct heat or sunlight. Throw away 6 weeks  after you remove the inhaler from the foil tray, or after the dose indicator reads 0, whichever comes first. Throw away any unopened packages after the expiration date. NOTE: This sheet is a summary. It may not cover all possible information. If you have questions about this medicine, talk to your doctor, pharmacist, or health care provider.  2018 Elsevier/Gold Standard (2016-01-29 15:54:39)

## 2017-06-01 ENCOUNTER — Other Ambulatory Visit: Payer: Self-pay | Admitting: Family Medicine

## 2017-06-06 ENCOUNTER — Encounter: Payer: Self-pay | Admitting: Physician Assistant

## 2017-06-14 ENCOUNTER — Other Ambulatory Visit: Payer: Self-pay | Admitting: Family Medicine

## 2017-06-14 DIAGNOSIS — I10 Essential (primary) hypertension: Secondary | ICD-10-CM

## 2017-06-15 NOTE — Telephone Encounter (Signed)
[id number] see refill req/thx dmf 

## 2017-06-21 ENCOUNTER — Ambulatory Visit: Payer: 59 | Admitting: Physician Assistant

## 2017-06-21 ENCOUNTER — Encounter: Payer: Self-pay | Admitting: Physician Assistant

## 2017-06-21 ENCOUNTER — Other Ambulatory Visit: Payer: Self-pay

## 2017-06-21 VITALS — BP 158/78 | HR 66 | Wt 232.0 lb

## 2017-06-21 DIAGNOSIS — J440 Chronic obstructive pulmonary disease with acute lower respiratory infection: Secondary | ICD-10-CM | POA: Diagnosis not present

## 2017-06-21 DIAGNOSIS — J209 Acute bronchitis, unspecified: Secondary | ICD-10-CM | POA: Diagnosis not present

## 2017-06-21 DIAGNOSIS — R0602 Shortness of breath: Secondary | ICD-10-CM | POA: Diagnosis not present

## 2017-06-21 DIAGNOSIS — R0789 Other chest pain: Secondary | ICD-10-CM | POA: Diagnosis not present

## 2017-06-21 DIAGNOSIS — R059 Cough, unspecified: Secondary | ICD-10-CM

## 2017-06-21 DIAGNOSIS — R05 Cough: Secondary | ICD-10-CM

## 2017-06-21 MED ORDER — DOXYCYCLINE HYCLATE 100 MG PO TABS: 100.0000 mg | ORAL_TABLET | Freq: Two times a day (BID) | ORAL | 0 refills | Status: DC

## 2017-06-21 MED ORDER — PREDNISONE 20 MG PO TABS: 60.0000 mg | ORAL_TABLET | Freq: Every day | ORAL | 0 refills | Status: DC

## 2017-06-21 MED ORDER — AZITHROMYCIN 250 MG PO TABS: ORAL_TABLET | ORAL | 0 refills | Status: DC

## 2017-06-21 NOTE — Patient Instructions (Signed)

## 2017-06-21 NOTE — Progress Notes (Signed)
Patient: Sara Roy Female    DOB: 12/10/1960   57 y.o.   MRN: [id number] Visit Date: 06/21/2017  Today's Provider: Margaretann Loveless, PA-C   Chief Complaint  Patient presents with  . COPD    1 month follow up   Subjective:    Breathing Problem  She complains of difficulty breathing and shortness of breath. This is a recurrent problem. The problem has been unchanged. Associated symptoms include chest pain and malaise/fatigue. Pertinent negatives include no fever. Her symptoms are aggravated by any activity, exercise and emotional stress. Her symptoms are alleviated by nothing. She reports no improvement on treatment. Her symptoms are not alleviated by diuretics.   Patient stopped Oxybutynin due to drowsiness side effect. However, patient does state that it did help her OAB and helped her sleep. Not awakening for the bathroom so often.   She does continue to complain of extreme fatigue and daytime somnolence as well. She feels she just has an acute exacerbation and once it is treated "correctly" she will get better.     Allergies  Allergen Reactions  . Lisinopril     Rash   . Penicillins Hives  . Losartan Rash     Current Outpatient Medications:  .  albuterol (PROVENTIL HFA;VENTOLIN HFA) 108 (90 Base) MCG/ACT inhaler, Inhale 2 puffs into the lungs every 6 (six) hours as needed for wheezing or shortness of breath., Disp: 1 Inhaler, Rfl: 0 .  ALPRAZolam (XANAX) 0.25 MG tablet, TAKE 1 TABLET BY MOUTH THREE TIMES DAILY, Disp: 90 tablet, Rfl: 2 .  amLODipine (NORVASC) 5 MG tablet, TAKE 1 TABLET BY MOUTH DAILY, Disp: 90 tablet, Rfl: 1 .  aspirin EC 81 MG tablet, Take 1 tablet (81 mg total) by mouth daily., Disp: 90 tablet, Rfl: 3 .  atorvastatin (LIPITOR) 40 MG tablet, TAKE 1 TABLET BY MOUTH EVERY DAY, Disp: 90 tablet, Rfl: 3 .  fluticasone furoate-vilanterol (BREO ELLIPTA) 100-25 MCG/INH AEPB, Inhale 1 puff into the lungs daily., Disp: 60 each, Rfl: 5 .  potassium chloride  (K-DUR) 10 MEQ tablet, Take 1 tablet (10 mEq total) by mouth daily., Disp: 90 tablet, Rfl: 1 .  acetaminophen (TYLENOL) 325 MG tablet, Take 2 tablets (650 mg total) by mouth every 4 (four) hours as needed for headache or mild pain. Limit ibuprofen while on aspirin and Plavix, to avoid stomach upset. (Patient not taking: Reported on 05/10/2017), Disp: , Rfl:   Review of Systems  Constitutional: Positive for fatigue and malaise/fatigue. Negative for fever.  HENT: Negative.   Respiratory: Positive for chest tightness and shortness of breath.   Cardiovascular: Positive for chest pain.  Gastrointestinal: Negative for diarrhea, nausea and vomiting.  Neurological: Negative.   Psychiatric/Behavioral: Positive for sleep disturbance.    Social History   Tobacco Use  . Smoking status: Current Every Day Smoker    Packs/day: 1.50    Years: 40.00    Pack years: 60.00    Types: Cigarettes  . Smokeless tobacco: Never Used  Substance Use Topics  . Alcohol use: No    Comment: Quit 6 years ago   Objective:   BP (!) 158/78   Pulse 66   Wt 232 lb (105.2 kg)   SpO2 94%   BMI 36.34 kg/m  Vitals:   06/21/17 1048  BP: (!) 158/78  Pulse: 66  SpO2: 94%  Weight: 232 lb (105.2 kg)     Physical Exam  Constitutional: She appears well-developed and well-nourished. No distress.  Neck: Normal range of motion. Neck supple. No JVD present. No tracheal deviation present. No thyromegaly present.  Cardiovascular: Normal rate, regular rhythm and normal heart sounds. Exam reveals no gallop and no friction rub.  No murmur heard. Pulmonary/Chest: Effort normal. No respiratory distress. She has no wheezes. She has rhonchi (throughout). She has no rales.  Lymphadenopathy:    She has no cervical adenopathy.  Skin: She is not diaphoretic.  Vitals reviewed.      Assessment & Plan:     1. Acute bronchitis with COPD (HCC) Will treat with prednisone and doxycycline as below for continued exacerbation. Continue  Breo daily. Continue albuterol for prn use. Discussed CXR to make sure nothing Roy going on. Patient states she will do it when she can. Also discussed patient would greatly benefit from a sleep study as she most likely has OSA but patient declines. I will call her with results to CXR of she completes testing. Also discussed smoking cessation for which she also declines and offered referral to pulmonology for restriction noted on PFT and that she is still having SOB. She declines this as well. She is to call if symptoms worsen.  - predniSONE (DELTASONE) 20 MG tablet; Take 3 tablets (60 mg total) by mouth daily with breakfast.  Dispense: 15 tablet; Refill: 0 - doxycycline (VIBRA-TABS) 100 MG tablet; Take 1 tablet (100 mg total) by mouth 2 (two) times daily.  Dispense: 20 tablet; Refill: 0  2. Cough See above medical treatment plan. - DG Chest 2 View; Future  3. SOB (shortness of breath) See above medical treatment plan. - DG Chest 2 View; Future  4. Chest tightness See above medical treatment plan. - DG Chest 2 View; Future       Margaretann Loveless, PA-C  [facility]

## 2017-06-27 ENCOUNTER — Ambulatory Visit
Admission: RE | Admit: 2017-06-27 | Discharge: 2017-06-27 | Disposition: A | Discharge status: Home / Self Care | Payer: 59 | Source: Ambulatory Visit | Attending: Physician Assistant | Admitting: Physician Assistant

## 2017-06-27 DIAGNOSIS — R05 Cough: Secondary | ICD-10-CM | POA: Insufficient documentation

## 2017-06-27 DIAGNOSIS — R059 Cough, unspecified: Secondary | ICD-10-CM

## 2017-06-27 DIAGNOSIS — R0602 Shortness of breath: Secondary | ICD-10-CM | POA: Insufficient documentation

## 2017-06-27 DIAGNOSIS — R0789 Other chest pain: Secondary | ICD-10-CM | POA: Diagnosis not present

## 2017-06-28 ENCOUNTER — Telehealth: Payer: Self-pay

## 2017-06-28 NOTE — Telephone Encounter (Signed)
Viewed by Sung Amabile Woolen on 06/28/2017 7:51 AM

## 2017-06-28 NOTE — Telephone Encounter (Signed)
-----   Message from Margaretann Loveless, [place] sent at 06/27/2017  4:01 PM EDT ----- CXR is normal. No pneumonia, bronchitic changes or pleurisy noted.

## 2017-07-01 ENCOUNTER — Telehealth: Payer: Self-pay | Admitting: Physician Assistant

## 2017-07-01 DIAGNOSIS — B370 Candidal stomatitis: Secondary | ICD-10-CM

## 2017-07-01 MED ORDER — NYSTATIN 100000 UNIT/ML MT SUSP: 5.0000 mL | Freq: Four times a day (QID) | OROMUCOSAL | 0 refills | Status: DC

## 2017-07-01 NOTE — Telephone Encounter (Signed)
Sent in Nystatin.

## 2017-07-01 NOTE — Telephone Encounter (Signed)
Patient called stating she has thrush in her mouth and would like something called in for this to [facility] on [address].

## 2017-07-05 ENCOUNTER — Telehealth: Payer: Self-pay | Admitting: Emergency Medicine

## 2017-07-05 DIAGNOSIS — T3695XA Adverse effect of unspecified systemic antibiotic, initial encounter: Principal | ICD-10-CM

## 2017-07-05 DIAGNOSIS — B379 Candidiasis, unspecified: Secondary | ICD-10-CM

## 2017-07-05 MED ORDER — FLUCONAZOLE 150 MG PO TABS: 150.0000 mg | ORAL_TABLET | Freq: Once | ORAL | 0 refills | Status: AC

## 2017-07-05 NOTE — Telephone Encounter (Signed)
I will send in but should your note read "she does have" or is it correct with "does not".

## 2017-07-05 NOTE — Telephone Encounter (Signed)
Pt reports that she is not having vaginal itching and burning and has a vaginal yeast infection. She would like diflucan called into [facility] [address].

## 2017-07-06 NOTE — Telephone Encounter (Signed)
Im so sorry, she DOES have vaginal itching and burning.

## 2017-07-18 ENCOUNTER — Encounter: Payer: Self-pay | Admitting: Physician Assistant

## 2017-07-18 ENCOUNTER — Ambulatory Visit: Payer: 59 | Admitting: Physician Assistant

## 2017-07-18 VITALS — BP 128/74 | HR 80 | Temp 98.0°F | Resp 20 | Wt 227.0 lb

## 2017-07-18 DIAGNOSIS — J0140 Acute pansinusitis, unspecified: Secondary | ICD-10-CM

## 2017-07-18 DIAGNOSIS — H66003 Acute suppurative otitis media without spontaneous rupture of ear drum, bilateral: Secondary | ICD-10-CM

## 2017-07-18 DIAGNOSIS — R05 Cough: Secondary | ICD-10-CM | POA: Diagnosis not present

## 2017-07-18 DIAGNOSIS — J209 Acute bronchitis, unspecified: Secondary | ICD-10-CM

## 2017-07-18 DIAGNOSIS — R059 Cough, unspecified: Secondary | ICD-10-CM

## 2017-07-18 DIAGNOSIS — J440 Chronic obstructive pulmonary disease with acute lower respiratory infection: Secondary | ICD-10-CM | POA: Diagnosis not present

## 2017-07-18 MED ORDER — HYDROCODONE-HOMATROPINE 5-1.5 MG/5ML PO SYRP: 5.0000 mL | ORAL_SOLUTION | Freq: Three times a day (TID) | ORAL | 0 refills | Status: DC | PRN

## 2017-07-18 MED ORDER — PREDNISONE 10 MG PO TABS: ORAL_TABLET | ORAL | 0 refills | Status: DC

## 2017-07-18 MED ORDER — FLUTICASONE FUROATE-VILANTEROL 200-25 MCG/INH IN AEPB: 1.0000 | INHALATION_SPRAY | Freq: Every day | RESPIRATORY_TRACT | 5 refills | Status: AC

## 2017-07-18 MED ORDER — CEFDINIR 300 MG PO CAPS: 300.0000 mg | ORAL_CAPSULE | Freq: Two times a day (BID) | ORAL | 0 refills | Status: DC

## 2017-07-18 NOTE — Progress Notes (Signed)
Patient: Sara Roy Female    DOB: 01-17-1961   57 y.o.   MRN: [id number] Visit Date: 07/18/2017  Today's Provider: Margaretann Loveless, PA-C   No chief complaint on file.  Subjective:    HPI Upper Respiratory Infection: Patient complains of symptoms of a URI, possible sinusitis. Symptoms include congestion and cough. Onset of symptoms was 5 days ago, gradually worsening since that time. She also c/o achiness, facial pain, nasal congestion, non productive cough, shortness of breath, sinus pressure, sore throat and wheezing for the past 4 days .  She is drinking plenty of fluids. Evaluation to date: 06/21/17  seen previously and thought to have a viral URI. Treatment to date: antibiotics. Prednisone, Cxr     Allergies  Allergen Reactions  . Lisinopril     Rash   . Penicillins Hives  . Losartan Rash     Current Outpatient Medications:  .  acetaminophen (TYLENOL) 325 MG tablet, Take 2 tablets (650 mg total) by mouth every 4 (four) hours as needed for headache or mild pain. Limit ibuprofen while on aspirin and Plavix, to avoid stomach upset., Disp: , Rfl:  .  albuterol (PROVENTIL HFA;VENTOLIN HFA) 108 (90 Base) MCG/ACT inhaler, Inhale 2 puffs into the lungs every 6 (six) hours as needed for wheezing or shortness of breath., Disp: 1 Inhaler, Rfl: 0 .  ALPRAZolam (XANAX) 0.25 MG tablet, TAKE 1 TABLET BY MOUTH THREE TIMES DAILY, Disp: 90 tablet, Rfl: 2 .  amLODipine (NORVASC) 5 MG tablet, TAKE 1 TABLET BY MOUTH DAILY, Disp: 90 tablet, Rfl: 1 .  aspirin EC 81 MG tablet, Take 1 tablet (81 mg total) by mouth daily., Disp: 90 tablet, Rfl: 3 .  atorvastatin (LIPITOR) 40 MG tablet, TAKE 1 TABLET BY MOUTH EVERY DAY, Disp: 90 tablet, Rfl: 3 .  potassium chloride (K-DUR) 10 MEQ tablet, Take 1 tablet (10 mEq total) by mouth daily., Disp: 90 tablet, Rfl: 1 .  fluticasone furoate-vilanterol (BREO ELLIPTA) 100-25 MCG/INH AEPB, Inhale 1 puff into the lungs daily. (Patient not taking: Reported  on 07/18/2017), Disp: 60 each, Rfl: 5  Review of Systems  Constitutional: Negative.   HENT: Positive for congestion, ear pain, postnasal drip, sinus pressure, sinus pain and sore throat.   Respiratory: Positive for cough, chest tightness, shortness of breath and wheezing.   Cardiovascular: Negative.     Social History   Tobacco Use  . Smoking status: Current Every Day Smoker    Packs/day: 1.50    Years: 40.00    Pack years: 60.00    Types: Cigarettes  . Smokeless tobacco: Never Used  Substance Use Topics  . Alcohol use: No    Comment: Quit 6 years ago   Objective:   BP 128/74 (BP Location: Left Arm, Patient Position: Sitting, Cuff Size: Normal)   Pulse 80   Temp 98 F (36.7 C) (Oral)   Resp 20   Wt 227 lb (103 kg)   SpO2 93%   BMI 35.55 kg/m  Vitals:   07/18/17 0844  BP: 128/74  Pulse: 80  Resp: 20  Temp: 98 F (36.7 C)  TempSrc: Oral  SpO2: 93%  Weight: 227 lb (103 kg)     Physical Exam  Constitutional: She appears well-developed and well-nourished. No distress.  HENT:  Head: Normocephalic and atraumatic.  Right Ear: Hearing, external ear and ear canal normal. There is tenderness. Tympanic membrane is erythematous and bulging. A middle ear effusion is present.  Left Ear: Hearing,  external ear and ear canal normal. There is tenderness. Tympanic membrane is erythematous and bulging. A middle ear effusion is present.  Nose: Right sinus exhibits maxillary sinus tenderness and frontal sinus tenderness. Left sinus exhibits maxillary sinus tenderness and frontal sinus tenderness.  Mouth/Throat: Uvula is midline and mucous membranes are normal. Posterior oropharyngeal edema and posterior oropharyngeal erythema present. No oropharyngeal exudate.  Eyes: Pupils are equal, round, and reactive to light. Conjunctivae are normal. Right eye exhibits no discharge. Left eye exhibits no discharge. No scleral icterus.  Neck: Normal range of motion. Neck supple. No tracheal deviation  present. No thyromegaly present.  Cardiovascular: Normal rate and regular rhythm. Exam reveals no gallop and no friction rub.  Murmur heard.  Systolic murmur is present with a grade of 2/6. Pulmonary/Chest: Effort normal. No stridor. No respiratory distress. She has decreased breath sounds. She has wheezes. She has rhonchi. She has no rales.  Lymphadenopathy:    She has no cervical adenopathy.  Skin: Skin is warm and dry. She is not diaphoretic.  Vitals reviewed.      Assessment & Plan:     1. Acute pansinusitis, recurrence not specified Worsening symptoms that have not responded to OTC medications. Will give Omnicef as below. Continue allergy medications. Stay well hydrated and get plenty of rest. Call if no symptom improvement or if symptoms worsen. - cefdinir (OMNICEF) 300 MG capsule; Take 1 capsule (300 mg total) by mouth 2 (two) times daily.  Dispense: 20 capsule; Refill: 0  2. Non-recurrent acute suppurative otitis media of both ears without spontaneous rupture of tympanic membranes - cefdinir (OMNICEF) 300 MG capsule; Take 1 capsule (300 mg total) by mouth 2 (two) times daily.  Dispense: 20 capsule; Refill: 0  3. Cough Worsening symptoms that has not responded to OTC medications. Will give Hycodan cough syrup as below for nighttime cough. Drowsiness precautions given to patient. Stay well hydrated. Use delsym, robitussin OR mucinex for daytime cough. - HYDROcodone-homatropine (HYCODAN) 5-1.5 MG/5ML syrup; Take 5 mLs by mouth every 8 (eight) hours as needed for cough.  Dispense: 120 mL; Refill: 0  4. Acute bronchitis with COPD (HCC) Worsening. Patient not using Breo as directed. Continues to smoke. Will give prednisone as below for acuity currently. Breo refilled. Patient needs to use daily.  - predniSONE (DELTASONE) 10 MG tablet; Take 6 tabs PO on day 1&2, 5 tabs PO on day 3&4, 4 tabs PO on day 5&6, 3 tabs PO on day 7&8, 2 tabs PO on day 9&10, 1 tab PO on day 11&12.  Dispense: 42  tablet; Refill: 0 - fluticasone furoate-vilanterol (BREO ELLIPTA) 200-25 MCG/INH AEPB; Inhale 1 puff into the lungs daily.  Dispense: 60 each; Refill: 5 - HYDROcodone-homatropine (HYCODAN) 5-1.5 MG/5ML syrup; Take 5 mLs by mouth every 8 (eight) hours as needed for cough.  Dispense: 120 mL; Refill: 0       Margaretann LovelessJennifer M Burnette, PA-C  [facility]

## 2017-07-18 NOTE — Patient Instructions (Signed)

## 2017-07-25 ENCOUNTER — Telehealth: Payer: Self-pay | Admitting: Physician Assistant

## 2017-07-25 DIAGNOSIS — T3695XA Adverse effect of unspecified systemic antibiotic, initial encounter: Principal | ICD-10-CM

## 2017-07-25 DIAGNOSIS — B379 Candidiasis, unspecified: Secondary | ICD-10-CM

## 2017-07-25 MED ORDER — FLUCONAZOLE 150 MG PO TABS: 150.0000 mg | ORAL_TABLET | Freq: Once | ORAL | 0 refills | Status: AC

## 2017-07-25 NOTE — Telephone Encounter (Signed)
Please review

## 2017-07-25 NOTE — Telephone Encounter (Signed)
Sent in

## 2017-07-25 NOTE — Telephone Encounter (Signed)
Pt stated that she is thinks she has a yeast infection due to all the antibiotic and is [facility] [address]. Please advise. Thanks TNP

## 2017-08-01 ENCOUNTER — Telehealth: Payer: Self-pay | Admitting: Physician Assistant

## 2017-08-01 DIAGNOSIS — B370 Candidal stomatitis: Secondary | ICD-10-CM

## 2017-08-01 NOTE — Telephone Encounter (Signed)
Pt states she is not feeling any better and she is on medication that isn't helping.  She is wanting to speak to OnwardJenni.

## 2017-08-01 NOTE — Telephone Encounter (Signed)
Please Review

## 2017-08-02 MED ORDER — NYSTATIN 100000 UNIT/ML MT SUSP: 5.0000 mL | Freq: Four times a day (QID) | OROMUCOSAL | 0 refills | Status: DC

## 2017-08-02 NOTE — Telephone Encounter (Signed)
Patient scheduled.

## 2017-08-02 NOTE — Telephone Encounter (Signed)
Please schedule patient tomorrow [phone]

## 2017-08-03 ENCOUNTER — Ambulatory Visit: Payer: 59 | Admitting: Physician Assistant

## 2017-08-03 ENCOUNTER — Other Ambulatory Visit: Payer: Self-pay | Admitting: Physician Assistant

## 2017-08-03 ENCOUNTER — Ambulatory Visit (INDEPENDENT_AMBULATORY_CARE_PROVIDER_SITE_OTHER): Payer: 59 | Admitting: Physician Assistant

## 2017-08-03 ENCOUNTER — Encounter: Payer: Self-pay | Admitting: Physician Assistant

## 2017-08-03 VITALS — BP 150/76 | HR 78 | Temp 98.1°F | Resp 20 | Ht 67.0 in | Wt 219.0 lb

## 2017-08-03 DIAGNOSIS — R631 Polydipsia: Secondary | ICD-10-CM

## 2017-08-03 DIAGNOSIS — H6981 Other specified disorders of Eustachian tube, right ear: Secondary | ICD-10-CM

## 2017-08-03 DIAGNOSIS — E119 Type 2 diabetes mellitus without complications: Secondary | ICD-10-CM | POA: Insufficient documentation

## 2017-08-03 DIAGNOSIS — R350 Frequency of micturition: Secondary | ICD-10-CM

## 2017-08-03 LAB — POCT URINALYSIS DIPSTICK
BILIRUBIN UA: NEGATIVE
Blood, UA: NEGATIVE
GLUCOSE UA: POSITIVE — AB
KETONES UA: NEGATIVE
Leukocytes, UA: NEGATIVE
Nitrite, UA: NEGATIVE
Protein, UA: NEGATIVE
SPEC GRAV UA: 1.01 (ref 1.010–1.025)
UROBILINOGEN UA: 0.2 U/dL
pH, UA: 6 (ref 5.0–8.0)

## 2017-08-03 LAB — POCT GLYCOSYLATED HEMOGLOBIN (HGB A1C): Hemoglobin A1C: 14 % — AB (ref 4.0–5.6)

## 2017-08-03 MED ORDER — FLUTICASONE PROPIONATE 50 MCG/ACT NA SUSP: 2.0000 | Freq: Every day | NASAL | 6 refills | Status: DC

## 2017-08-03 MED ORDER — ONETOUCH VERIO W/DEVICE KIT: 1.0000 | PACK | Freq: Every day | 0 refills | Status: AC

## 2017-08-03 MED ORDER — MOMETASONE FUROATE 50 MCG/ACT NA SUSP: 2.0000 | Freq: Every day | NASAL | 12 refills | Status: AC

## 2017-08-03 MED ORDER — ONETOUCH ULTRASOFT LANCETS MISC: 12 refills | Status: DC

## 2017-08-03 MED ORDER — GLUCOSE BLOOD VI STRP: ORAL_STRIP | 12 refills | Status: DC

## 2017-08-03 NOTE — Progress Notes (Signed)
Changed therapy from flonase to nasonex per insurance

## 2017-08-03 NOTE — Patient Instructions (Signed)
Type 2 Diabetes Mellitus, Diagnosis, Adult Type 2 diabetes (type 2 diabetes mellitus) is a long-term (chronic) disease. It may be caused by one or both of these problems:  Your body does not make enough of a hormone called insulin.  Your body does not react in a normal way to insulin that it makes.  Insulin lets sugars (glucose) go into cells in the body. This gives you energy. If you have type 2 diabetes, sugars cannot get into cells. This causes high blood sugar (hyperglycemia). Your doctor will set treatment goals for you. Generally, you should have these blood sugar levels:  Before meals (preprandial): 80-130 mg/dL (4.4-7.2 mmol/L).  After meals (postprandial): below 180 mg/dL (10 mmol/L).  A1c (hemoglobin A1c) level: less than 7%.  Follow these instructions at home: Questions to Ask Your Doctor  You may want to ask these questions:  Do I need to meet with a diabetes educator?  Where can I find a support group for people with diabetes?  What equipment will I need to care for myself at home?  What diabetes medicines do I need? When should I take them?  How often do I need to check my blood sugar?  What number can I call if I have questions?  When is my next doctor's visit?  General instructions  Take over-the-counter and prescription medicines only as told by your doctor.  Keep all follow-up visits as told by your doctor. This is important. Contact a doctor if:  Your blood sugar is at or above 240 mg/dL (13.3 mmol/L) for 2 days in a row.  You have been sick or have had a fever for 2 days or more and you are not getting better.  You have any of these problems for more than 6 hours: ? You cannot eat or drink. ? You feel sick to your stomach (nauseous). ? You throw up (vomit). ? You have watery poop (diarrhea). Get help right away if:  Your blood sugar is lower than 54 mg/dL (3 mmol/L).  You get confused.  You have trouble: ? Thinking  clearly. ? Breathing.  You have moderate or large ketone levels in your pee (urine). This information is not intended to replace advice given to you by your health care provider. Make sure you discuss any questions you have with your health care provider. Document Released: 11/04/2007 Document Revised: 07/03/2015 Document Reviewed: 02/28/2015 Elsevier Interactive Patient Education  2018 Else[employer] Inc. Carbohydrate Counting for Diabetes Mellitus, Adult Carbohydrate counting is a method for keeping track of how many carbohydrates you eat. Eating carbohydrates naturally increases the amount of sugar (glucose) in the blood. Counting how many carbohydrates you eat helps keep your blood glucose within normal limits, which helps you manage your diabetes (diabetes mellitus). It is important to know how many carbohydrates you can safely have in each meal. This is different for every person. A diet and nutrition specialist (registered dietitian) can help you make a meal plan and calculate how many carbohydrates you should have at each meal and snack. Carbohydrates are found in the following foods:  Grains, such as breads and cereals.  Dried beans and soy products.  Starchy vegetables, such as potatoes, peas, and corn.  Fruit and fruit juices.  Milk and yogurt.  Sweets and snack foods, such as cake, cookies, candy, chips, and soft drinks.  How do I count carbohydrates? There are two ways to count carbohydrates in food. You can use either of the methods or a combination of both. Reading "  Nutrition Facts" on packaged food The "Nutrition Facts" list is included on the labels of almost all packaged foods and beverages in the [place] It includes:  The serving size.  Information about nutrients in each serving, including the grams (g) of carbohydrate per serving.  To use the "Nutrition Facts":  Decide how many servings you will have.  Multiply the number of servings by the number of carbohydrates  per serving.  The resulting number is the total amount of carbohydrates that you will be having.  Learning standard serving sizes of other foods When you eat foods containing carbohydrates that are not packaged or do not include "Nutrition Facts" on the label, you need to measure the servings in order to count the amount of carbohydrates:  Measure the foods that you will eat with a food scale or measuring cup, if needed.  Decide how many standard-size servings you will eat.  Multiply the number of servings by 15. Most carbohydrate-rich foods have about 15 g of carbohydrates per serving. ? For example, if you eat 8 oz (170 g) of strawberries, you will have eaten 2 servings and 30 g of carbohydrates (2 servings x 15 g = 30 g).  For foods that have more than one food mixed, such as soups and casseroles, you must count the carbohydrates in each food that is included.  The following list contains standard serving sizes of common carbohydrate-rich foods. Each of these servings has about 15 g of carbohydrates:   hamburger bun or  English muffin.   oz (15 mL) syrup.   oz (14 g) jelly.  1 slice of bread.  1 six-inch tortilla.  3 oz (85 g) cooked rice or pasta.  4 oz (113 g) cooked dried beans.  4 oz (113 g) starchy vegetable, such as peas, corn, or potatoes.  4 oz (113 g) hot cereal.  4 oz (113 g) mashed potatoes or  of a large baked potato.  4 oz (113 g) canned or frozen fruit.  4 oz (120 mL) fruit juice.  4-6 crackers.  6 chicken nuggets.  6 oz (170 g) unsweetened dry cereal.  6 oz (170 g) plain fat-free yogurt or yogurt sweetened with artificial sweeteners.  8 oz (240 mL) milk.  8 oz (170 g) fresh fruit or one small piece of fruit.  24 oz (680 g) popped popcorn.  Example of carbohydrate counting Sample meal  3 oz (85 g) chicken breast.  6 oz (170 g) brown rice.  4 oz (113 g) corn.  8 oz (240 mL) milk.  8 oz (170 g) strawberries with sugar-free whipped  topping. Carbohydrate calculation 1. Identify the foods that contain carbohydrates: ? Rice. ? Corn. ? Milk. ? Strawberries. 2. Calculate how many servings you have of each food: ? 2 servings rice. ? 1 serving corn. ? 1 serving milk. ? 1 serving strawberries. 3. Multiply each number of servings by 15 g: ? 2 servings rice x 15 g = 30 g. ? 1 serving corn x 15 g = 15 g. ? 1 serving milk x 15 g = 15 g. ? 1 serving strawberries x 15 g = 15 g. 4. Add together all of the amounts to find the total grams of carbohydrates eaten: ? 30 g + 15 g + 15 g + 15 g = 75 g of carbohydrates total. This information is not intended to replace advice given to you by your health care provider. Make sure you discuss any questions you have with your health   care provider. Document Released: 01/25/2005 Document Revised: 08/15/2015 Document Reviewed: 07/09/2015 Elsevier Interactive Patient Education  2018 Elsevier Inc.  

## 2017-08-03 NOTE — Progress Notes (Signed)
Patient: Sara Roy Female    DOB: 02/25/1960   57 y.o.   MRN: [id number] Visit Date: 08/03/2017  Today's Provider: Mar Daring, PA-C   Chief Complaint  Patient presents with  . Urinary Frequency   Subjective:    HPI Patient here today C/O frequent urination and increased thirst in the last 2-3 weeks. Patient reports symptoms are present day and night. Patient reports some pain and burning with urination. Patient reports she has been on antibiotics in the last few weeks to treat sinusitis and bronchitis.   Patient C/O persistent cough, sore throat, and ear pain. Patient reports symptoms did not clear from last office visit. Patient reports Claritin made her sleepy and was not able to take it daily.     Allergies  Allergen Reactions  . Lisinopril     Rash   . Penicillins Hives  . Losartan Rash     Current Outpatient Medications:  .  acetaminophen (TYLENOL) 325 MG tablet, Take 2 tablets (650 mg total) by mouth every 4 (four) hours as needed for headache or mild pain. Limit ibuprofen while on aspirin and Plavix, to avoid stomach upset., Disp: , Rfl:  .  albuterol (PROVENTIL HFA;VENTOLIN HFA) 108 (90 Base) MCG/ACT inhaler, Inhale 2 puffs into the lungs every 6 (six) hours as needed for wheezing or shortness of breath., Disp: 1 Inhaler, Rfl: 0 .  ALPRAZolam (XANAX) 0.25 MG tablet, TAKE 1 TABLET BY MOUTH THREE TIMES DAILY, Disp: 90 tablet, Rfl: 2 .  amLODipine (NORVASC) 5 MG tablet, TAKE 1 TABLET BY MOUTH DAILY, Disp: 90 tablet, Rfl: 1 .  aspirin EC 81 MG tablet, Take 1 tablet (81 mg total) by mouth daily., Disp: 90 tablet, Rfl: 3 .  atorvastatin (LIPITOR) 40 MG tablet, TAKE 1 TABLET BY MOUTH EVERY DAY, Disp: 90 tablet, Rfl: 3 .  fluticasone furoate-vilanterol (BREO ELLIPTA) 200-25 MCG/INH AEPB, Inhale 1 puff into the lungs daily., Disp: 60 each, Rfl: 5 .  nystatin (MYCOSTATIN) 100000 UNIT/ML suspension, Take 5 mLs (500,000 Units total) by mouth 4 (four) times daily.,  Disp: 60 mL, Rfl: 0 .  potassium chloride (K-DUR) 10 MEQ tablet, Take 1 tablet (10 mEq total) by mouth daily., Disp: 90 tablet, Rfl: 1  Review of Systems  Constitutional: Positive for fatigue and unexpected weight change.  HENT: Positive for congestion, ear pain (right), postnasal drip and sore throat.   Respiratory: Positive for cough.   Cardiovascular: Negative.   Gastrointestinal: Negative.   Endocrine: Positive for polydipsia and polyuria.  Genitourinary: Positive for frequency.    Social History   Tobacco Use  . Smoking status: Current Every Day Smoker    Packs/day: 1.50    Years: 40.00    Pack years: 60.00    Types: Cigarettes  . Smokeless tobacco: Never Used  Substance Use Topics  . Alcohol use: No    Comment: Quit 6 years ago   Objective:   BP (!) 150/76 (BP Location: Left Arm, Patient Position: Sitting, Cuff Size: Normal)   Pulse 78   Temp 98.1 F (36.7 C) (Oral)   Resp 20   Ht '5\' 7"'$  (1.702 m)   Wt 219 lb (99.3 kg)   SpO2 96%   BMI 34.30 kg/m  Vitals:   08/03/17 0954  BP: (!) 150/76  Pulse: 78  Resp: 20  Temp: 98.1 F (36.7 C)  TempSrc: Oral  SpO2: 96%  Weight: 219 lb (99.3 kg)  Height: '5\' 7"'$  (1.702 m)  Physical Exam  Constitutional: She appears well-developed and well-nourished. No distress.  HENT:  Head: Normocephalic and atraumatic.  Right Ear: Hearing, external ear and ear canal normal. Tympanic membrane is bulging. Tympanic membrane is not erythematous. No middle ear effusion.  Left Ear: Hearing, tympanic membrane, external ear and ear canal normal.  Nose: Nose normal.  Mouth/Throat: Uvula is midline, oropharynx is clear and moist and mucous membranes are normal. No oropharyngeal exudate.  Eyes: Pupils are equal, round, and reactive to light. Conjunctivae are normal. Right eye exhibits no discharge. Left eye exhibits no discharge. No scleral icterus.  Neck: Normal range of motion. Neck supple. No tracheal deviation present. No thyromegaly  present.  Cardiovascular: Normal rate, regular rhythm and normal heart sounds. Exam reveals no gallop and no friction rub.  No murmur heard. Pulmonary/Chest: Effort normal and breath sounds normal. No stridor. No respiratory distress. She has no wheezes. She has no rales.  Lymphadenopathy:    She has no cervical adenopathy.  Skin: Skin is warm and dry. She is not diaphoretic.  Vitals reviewed.       Assessment & Plan:     1. Type 2 diabetes mellitus without complication, without long-term current use of insulin ([place]) New onset diabetes. A1c greater than 14.0 on POCT testing.UA postive for 2000 glucose. Labs ordered to verify current level. Tresiba 20 units given to patient today in office. Will have her continue tresiba x 1 week then will have her return in one week to answer any questions, show how to use glucometer and change therapy from insulin to oral therapy. She voices understanding.  - Blood Glucose Monitoring Suppl (ONETOUCH VERIO) w/Device KIT; 1 kit by Does not apply route daily before breakfast.  Dispense: 1 kit; Refill: 0 - glucose blood (ONETOUCH VERIO) test strip; To check BS once daily  Dispense: 100 each; Refill: 12 - Lancets (ONETOUCH ULTRASOFT) lancets; To check BS once daily  Dispense: 100 each; Refill: 12 - Basic Metabolic Panel (BMET) - HgB A1c  2. Increased thirst See above medical treatment plan. - POCT glycosylated hemoglobin (Hb A1C) - POCT urinalysis dipstick  3. Increased frequency of urination See above medical treatment plan. - POCT glycosylated hemoglobin (Hb A1C) - POCT urinalysis dipstick  4. Dysfunction of right eustachian tube Noted in R ear and causing referred pain to right side of throat. Will start flonase as below. Will recheck next week.  - fluticasone (FLONASE) 50 MCG/ACT nasal spray; Place 2 sprays into both nostrils daily.  Dispense: 16 g; Refill: [place], PA-C  [facility]

## 2017-08-04 ENCOUNTER — Ambulatory Visit (INDEPENDENT_AMBULATORY_CARE_PROVIDER_SITE_OTHER): Payer: 59 | Admitting: Cardiovascular Disease

## 2017-08-04 ENCOUNTER — Telehealth: Payer: Self-pay

## 2017-08-04 ENCOUNTER — Encounter: Payer: Self-pay | Admitting: Cardiovascular Disease

## 2017-08-04 VITALS — BP 140/68 | HR 78 | Ht 67.0 in | Wt 219.5 lb

## 2017-08-04 DIAGNOSIS — Z720 Tobacco use: Secondary | ICD-10-CM | POA: Diagnosis not present

## 2017-08-04 DIAGNOSIS — E785 Hyperlipidemia, unspecified: Secondary | ICD-10-CM | POA: Diagnosis not present

## 2017-08-04 DIAGNOSIS — I10 Essential (primary) hypertension: Secondary | ICD-10-CM

## 2017-08-04 DIAGNOSIS — I739 Peripheral vascular disease, unspecified: Secondary | ICD-10-CM | POA: Diagnosis not present

## 2017-08-04 LAB — BASIC METABOLIC PANEL
BUN / CREAT RATIO: 11 (ref 9–23)
BUN: 10 mg/dL (ref 6–24)
CALCIUM: 9.2 mg/dL (ref 8.7–10.2)
CHLORIDE: 92 mmol/L — AB (ref 96–106)
CO2: 24 mmol/L (ref 20–29)
Creatinine, Ser: 0.92 mg/dL (ref 0.57–1.00)
GFR calc Af Amer: 79 mL/min/{1.73_m2} (ref >59)
GFR calc non Af Amer: 69 mL/min/{1.73_m2} (ref >59)
GLUCOSE: 608 mg/dL — AB (ref 65–99)
POTASSIUM: 3.9 mmol/L (ref 3.5–5.2)
Sodium: 135 mmol/L (ref 134–144)

## 2017-08-04 LAB — HEMOGLOBIN A1C
Est. average glucose Bld gHb Est-mCnc: 335 mg/dL
Hgb A1c MFr Bld: 13.3 % — ABNORMAL HIGH (ref 4.8–5.6)

## 2017-08-04 NOTE — Patient Instructions (Signed)
Medication Instructions:  Your physician recommends that you continue on your current medications as directed. Please refer to the Current Medication list given to you today.    Labwork: none  Testing/Procedures: Your physician has requested that you have an ankle brachial index (ABI). During this test an ultrasound and blood pressure cuff are used to evaluate the arteries that supply the arms and legs with blood. Allow thirty minutes for this exam. There are no restrictions or special instructions.  Your physician has requested that you have an aorta/iliac duplex. During this test, an ultrasound is used to evaluate the aorta. Allow 30 minutes for this exam. Do not eat after midnight the day before and avoid carbonated beverages.   Follow-Up: Your physician recommends that you schedule a follow-up appointment in: 3 MONTHS WITH DR ARIDA.   If you need a refill on your cardiac medications before your next appointment, please call your pharmacy.     Steps to Quit Smoking Smoking tobacco can be bad for your health. It can also affect almost every organ in your body. Smoking puts you and people around you at risk for many serious long-lasting (chronic) diseases. Quitting smoking is hard, but it is one of the best things that you can do for your health. It is never too late to quit. What are the benefits of quitting smoking? When you quit smoking, you lower your risk for getting serious diseases and conditions. They can include:  Lung cancer or lung disease.  Heart disease.  Stroke.  Heart attack.  Not being able to have children (infertility).  Weak bones (osteoporosis) and broken bones (fractures).  If you have coughing, wheezing, and shortness of breath, those symptoms may get better when you quit. You may also get sick less often. If you are pregnant, quitting smoking can help to lower your chances of having a baby of low birth weight. What can I do to help me quit  smoking? Talk with your doctor about what can help you quit smoking. Some things you can do (strategies) include:  Quitting smoking totally, instead of slowly cutting back how much you smoke over a period of time.  Going to in-person counseling. You are more likely to quit if you go to many counseling sessions.  Using resources and support systems, such as: ? Agricultural engineer with a Veterinary surgeon. ? Phone quitlines. ? Automotive engineer. ? Support groups or group counseling. ? Text messaging programs. ? Mobile phone apps or applications.  Taking medicines. Some of these medicines may have nicotine in them. If you are pregnant or breastfeeding, do not take any medicines to quit smoking unless your doctor says it is okay. Talk with your doctor about counseling or other things that can help you.  Talk with your doctor about using more than one strategy at the same time, such as taking medicines while you are also going to in-person counseling. This can help make quitting easier. What things can I do to make it easier to quit? Quitting smoking might feel very hard at first, but there is a lot that you can do to make it easier. Take these steps:  Talk to your family and friends. Ask them to support and encourage you.  Call phone quitlines, reach out to support groups, or work with a Veterinary surgeon.  Ask people who smoke to not smoke around you.  Avoid places that make you want (trigger) to smoke, such as: ? Bars. ? Parties. ? Smoke-break areas at work.  Spend  time with people who do not smoke.  Lower the stress in your life. Stress can make you want to smoke. Try these things to help your stress: ? Getting regular exercise. ? Deep-breathing exercises. ? Yoga. ? Meditating. ? Doing a body scan. To do this, close your eyes, focus on one area of your body at a time from head to toe, and notice which parts of your body are tense. Try to relax the muscles in those areas.  Download or buy  apps on your mobile phone or tablet that can help you stick to your quit plan. There are many free apps, such as QuitGuide from the [employer] developer(Centers for Disease Control and Prevention). You can find more support from smokefree.gov and other websites.  This information is not intended to replace advice given to you by your health care provider. Make sure you discuss any questions you have with your health care provider. Document Released: 11/21/2008 Document Revised: 09/23/2015 Document Reviewed: 06/11/2014 Elsevier Interactive Patient Education  2018 [employer]

## 2017-08-04 NOTE — Telephone Encounter (Signed)
Patient advised as below. Patient wants to know if there is a specific time she needs to use insuline or right before bedtime? Please advise.

## 2017-08-04 NOTE — Telephone Encounter (Signed)
Most people will take around 8 with a small snack

## 2017-08-04 NOTE — Progress Notes (Signed)
Cardiology Office Note   Date:  08/04/2017   ID:  Sara Roy, DOB 03-Jun-1960, MRN [id number]  PCP:  Mar Daring, PA-C  Cardiologist:   Kathlyn Sacramento, MD   Chief Complaint  Patient presents with  . other    12 month f/u c/o aching legs. Meds reviewed verbally with pt.      History of Present Illness: Sara Roy is a 57 y.o. female who presents for  a follow up visit regarding peripheral arterial disease. She has no previous cardiac history. She has chronic medical conditions that include hypertension, hyperlipidemia and prolonged tobacco use. She smokes one half pack per day and has been doing so for more than 40 years. She is s/p bilateral kissing stent placement to both common iliac arteries into the distal aorta in September 2015 for severe claudication.  She had an echocardiogram done in June 2016 for a heart murmur which showed normal LV systolic function with no significant valvular abnormalities. The aortic valve was not well-visualized.  She has been feeling poorly recently with increased fatigue, cough and shortness of breath.  She was just diagnosed with type 2 diabetes recently with a blood sugar greater than 600 and was started on insulin.  Unfortunately, she continues to smoke.  She also reports worsening bilateral leg pain with exertion.  Past Medical History:  Diagnosis Date  . Allergy   . Anemia   . Anxiety   . Family history of early CAD   . Heart murmur   . HOH (hard of hearing)    reads lips wears hearing aids  . Hyperlipidemia   . Hypertension   . Peripheral vascular disease ([place])   . Substance abuse ([place])    Quit drinking alcohol 6 years ago  . Tobacco abuse     Past Surgical History:  Procedure Laterality Date  . ABDOMINAL AORTAGRAM N/A 10/10/2013   Procedure: ABDOMINAL Maxcine Ham;  Surgeon: Wellington Hampshire, MD;  Location: [facility];  Service: Cardiovascular;  Laterality: N/A;  . ABDOMINAL HYSTERECTOMY    . CHOLECYSTECTOMY    .  COLONOSCOPY WITH PROPOFOL N/A 10/16/2015   Procedure: COLONOSCOPY WITH PROPOFOL;  Surgeon: Lucilla Lame, MD;  Location: [facility];  Service: Endoscopy;  Laterality: N/A;  . LOWER EXTREMITY ANGIOGRAM Bilateral 10/10/2013   ILIAC   BILATERAL   BY DR ARDA  . POLYPECTOMY  10/16/2015   Procedure: POLYPECTOMY;  Surgeon: Lucilla Lame, MD;  Location: [facility];  Service: Endoscopy;;  . SKIN CANCER EXCISION       Current Outpatient Medications  Medication Sig Dispense Refill  . acetaminophen (TYLENOL) 325 MG tablet Take 2 tablets (650 mg total) by mouth every 4 (four) hours as needed for headache or mild pain. Limit ibuprofen while on aspirin and Plavix, to avoid stomach upset.    Marland Kitchen albuterol (PROVENTIL HFA;VENTOLIN HFA) 108 (90 Base) MCG/ACT inhaler Inhale 2 puffs into the lungs every 6 (six) hours as needed for wheezing or shortness of breath. 1 Inhaler 0  . ALPRAZolam (XANAX) 0.25 MG tablet TAKE 1 TABLET BY MOUTH THREE TIMES DAILY 90 tablet 2  . amLODipine (NORVASC) 5 MG tablet TAKE 1 TABLET BY MOUTH DAILY 90 tablet 1  . aspirin EC 81 MG tablet Take 1 tablet (81 mg total) by mouth daily. 90 tablet 3  . atorvastatin (LIPITOR) 40 MG tablet TAKE 1 TABLET BY MOUTH EVERY DAY 90 tablet 3  . Blood Glucose Monitoring Suppl (ONETOUCH VERIO) w/Device KIT 1  kit by Does not apply route daily before breakfast. 1 kit 0  . fluticasone furoate-vilanterol (BREO ELLIPTA) 200-25 MCG/INH AEPB Inhale 1 puff into the lungs daily. 60 each 5  . glucose blood (ONETOUCH VERIO) test strip To check BS once daily 100 each 12  . Lancets (ONETOUCH ULTRASOFT) lancets To check BS once daily 100 each 12  . mometasone (NASONEX) 50 MCG/ACT nasal spray Place 2 sprays into the nose daily. 17 g 12  . nystatin (MYCOSTATIN) 100000 UNIT/ML suspension Take 5 mLs (500,000 Units total) by mouth 4 (four) times daily. 60 mL 0  . potassium chloride (K-DUR) 10 MEQ tablet Take 1 tablet (10 mEq total) by mouth daily. 90 tablet 1    No current facility-administered medications for this visit.     Allergies:   Lisinopril; Penicillins; and Losartan    Social History:  The patient  reports that she has been smoking cigarettes.  She has a 60.00 pack-year smoking history. She has never used smokeless tobacco. She reports that she has current or past drug history. Drug: Marijuana. She reports that she does not drink alcohol.   Family History:  The patient's family history includes Cancer in her mother; Heart disease in her father; Hemachromatosis in her other; Hodgkin's lymphoma in her cousin.    ROS:  Please see the history of present illness.   Otherwise, review of systems are positive for none.   All other systems are reviewed and negative.    PHYSICAL EXAM: VS:  BP 140/68 (BP Location: Left Arm, Patient Position: Sitting, Cuff Size: Normal)   Pulse 78   Ht '5\' 7"'$  (1.702 m)   Wt 219 lb 8 oz (99.6 kg)   BMI 34.38 kg/m  , BMI Body mass index is 34.38 kg/m. GEN: Well nourished, well developed, in no acute distress  HEENT: normal  Neck: no JVD, carotid bruits, or masses Cardiac: RRR; no rubs, or gallops,no edema . 2/6 crescendo decrescendo systolic murmur in the aortic area which is early peaking. Respiratory:  clear to auscultation bilaterally, normal work of breathing GI: soft, nontender, nondistended, + BS MS: no deformity or atrophy  Skin: warm and dry, no rash Neuro:  Strength and sensation are intact Psych: euthymic mood, full affect Bilateral posterior tibial pulse is palpable.   EKG:  EKG is ordered today. The ekg ordered today demonstrates normal sinus rhythm with nonspecific IVCD.   Recent Labs: 04/14/2017: ALT 24; Hemoglobin 14.1; Platelets 329 08/03/2017: BUN 10; Creatinine, Ser 0.92; Potassium 3.9; Sodium 135    Lipid Panel    Component Value Date/Time   CHOL 132 03/10/2015 1128   CHOL 139 10/26/2013 0757   TRIG 187.0 (H) 03/10/2015 1128   HDL 36.00 (L) 03/10/2015 1128   HDL 41 10/26/2013  0757   CHOLHDL 4 03/10/2015 1128   VLDL 37.4 03/10/2015 1128   LDLCALC 58 03/10/2015 1128   LDLCALC 76 10/26/2013 0757   LDLDIRECT 162.7 08/18/2011 1007      Wt Readings from Last 3 Encounters:  08/04/17 219 lb 8 oz (99.6 kg)  08/03/17 219 lb (99.3 kg)  07/18/17 227 lb (103 kg)        ASSESSMENT AND PLAN:  1.  Peripheral arterial disease: Status post bilateral common iliac artery stent placement .  She reports worsening bilateral leg pain with exertion but distal pulses are palpable.  She is due for follow-up aortoiliac duplex and ABI.  2. Essential hypertension:  Blood pressure is controlled on current medications.  3. Hyperlipidemia:  Continue treatment with atorvastatin 40 mg once daily. Most recent lipid profile showed an LDL of 58.  4. Tobacco use: I again discussed with her the importance of smoking cessation.  5.  Exertional dyspnea and cough: I suspect likely due to lung disease in the setting of continued tobacco use.  However, she has multiple risk factors for coronary artery disease and she has an established history of peripheral arterial disease.  She will likely require further ischemic cardiac evaluation in the near future but I would like for her blood sugar to be more controlled before proceeding.  I will reevaluate this in few months.  Disposition:   FU with me in  3 months  Signed,  Kathlyn Sacramento, MD  08/04/2017 11:04 AM    [place]

## 2017-08-04 NOTE — Telephone Encounter (Signed)
-----   Message from Margaretann LovelessJennifer M Burnette, PA-C sent at 08/04/2017  8:29 AM EDT ----- Sugar yesterday was 608 and official A1c was 13.3. Continue insulin as discussed yesterday and I will see you in 1 week.

## 2017-08-04 NOTE — Telephone Encounter (Signed)
Patient advised as below.  

## 2017-08-10 ENCOUNTER — Encounter: Payer: Self-pay | Admitting: Physician Assistant

## 2017-08-10 ENCOUNTER — Ambulatory Visit: Payer: 59 | Admitting: Physician Assistant

## 2017-08-10 ENCOUNTER — Telehealth: Payer: Self-pay

## 2017-08-10 VITALS — BP 140/80 | HR 68 | Temp 98.1°F | Resp 20 | Ht 67.0 in | Wt 217.0 lb

## 2017-08-10 DIAGNOSIS — E119 Type 2 diabetes mellitus without complications: Secondary | ICD-10-CM | POA: Diagnosis not present

## 2017-08-10 DIAGNOSIS — F411 Generalized anxiety disorder: Secondary | ICD-10-CM | POA: Diagnosis not present

## 2017-08-10 DIAGNOSIS — B370 Candidal stomatitis: Secondary | ICD-10-CM | POA: Diagnosis not present

## 2017-08-10 DIAGNOSIS — B373 Candidiasis of vulva and vagina: Secondary | ICD-10-CM

## 2017-08-10 DIAGNOSIS — B3731 Acute candidiasis of vulva and vagina: Secondary | ICD-10-CM

## 2017-08-10 MED ORDER — FLUCONAZOLE 150 MG PO TABS: 150.0000 mg | ORAL_TABLET | Freq: Once | ORAL | 0 refills | Status: AC

## 2017-08-10 MED ORDER — METFORMIN HCL 1000 MG PO TABS: 1000.0000 mg | ORAL_TABLET | Freq: Two times a day (BID) | ORAL | 0 refills | Status: DC

## 2017-08-10 MED ORDER — NYSTATIN 100000 UNIT/ML MT SUSP: 5.0000 mL | Freq: Four times a day (QID) | OROMUCOSAL | 0 refills | Status: AC

## 2017-08-10 MED ORDER — CITALOPRAM HYDROBROMIDE 20 MG PO TABS: 20.0000 mg | ORAL_TABLET | Freq: Every day | ORAL | 0 refills | Status: DC

## 2017-08-10 NOTE — Telephone Encounter (Signed)
Patient advised as below.  

## 2017-08-10 NOTE — Telephone Encounter (Signed)
Patient calling that she developed Diarrhea with the one dose of  Metformin and she wants to know if she should continue or if you can send in something different.  Thanks,  -Sara Roy  [phone]
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
## 2017-08-10 NOTE — Telephone Encounter (Signed)
She should try to continue and make sure to take with food. If it does not lessen over 1 week and is significant enough we can change.

## 2017-08-10 NOTE — Patient Instructions (Signed)
Citalopram tablets What is this medicine? CITALOPRAM (sye TAL oh pram) is a medicine for depression. This medicine may be used for other purposes; ask your health care provider or pharmacist if you have questions. COMMON BRAND NAME(S): Celexa What should I tell my health care provider before I take this medicine? They need to know if you have any of these conditions: -bleeding disorders -bipolar disorder or a family history of bipolar disorder -glaucoma -heart disease -history of irregular heartbeat -kidney disease -liver disease -low levels of magnesium or potassium in the blood -receiving electroconvulsive therapy -seizures -suicidal thoughts, plans, or attempt; a previous suicide attempt by you or a family member -take medicines that treat or prevent blood clots -thyroid disease -an unusual or allergic reaction to citalopram, escitalopram, other medicines, foods, dyes, or preservatives -pregnant or trying to become pregnant -breast-feeding How should I use this medicine? Take this medicine by mouth with a glass of water. Follow the directions on the prescription label. You can take it with or without food. Take your medicine at regular intervals. Do not take your medicine more often than directed. Do not stop taking this medicine suddenly except upon the advice of your doctor. Stopping this medicine too quickly may cause serious side effects or your condition may worsen. A special MedGuide will be given to you by the pharmacist with each prescription and refill. Be sure to read this information carefully each time. Talk to your pediatrician regarding the use of this medicine in children. Special care may be needed. Patients over 60 years old may have a stronger reaction and need a smaller dose. Overdosage: If you think you have taken too much of this medicine contact a poison control center or emergency room at once. NOTE: This medicine is only for you. Do not share this medicine with  others. What if I miss a dose? If you miss a dose, take it as soon as you can. If it is almost time for your next dose, take only that dose. Do not take double or extra doses. What may interact with this medicine? Do not take this medicine with any of the following medications: -certain medicines for fungal infections like fluconazole, itraconazole, ketoconazole, posaconazole, voriconazole -cisapride -dofetilide -dronedarone -escitalopram -linezolid -MAOIs like Carbex, Eldepryl, Marplan, Nardil, and Parnate -methylene blue (injected into a vein) -pimozide -thioridazine -ziprasidone This medicine may also interact with the following medications: -alcohol -amphetamines -aspirin and aspirin-like medicines -carbamazepine -certain medicines for depression, anxiety, or psychotic disturbances -certain medicines for infections like chloroquine, clarithromycin, erythromycin, furazolidone, isoniazid, pentamidine -certain medicines for migraine headaches like almotriptan, eletriptan, frovatriptan, naratriptan, rizatriptan, sumatriptan, zolmitriptan -certain medicines for sleep -certain medicines that treat or prevent blood clots like dalteparin, enoxaparin, warfarin -cimetidine -diuretics -fentanyl -lithium -methadone -metoprolol -NSAIDs, medicines for pain and inflammation, like ibuprofen or naproxen -omeprazole -other medicines that prolong the QT interval (cause an abnormal heart rhythm) -procarbazine -rasagiline -supplements like St. John's wort, kava kava, valerian -tramadol -tryptophan This list may not describe all possible interactions. Give your health care provider a list of all the medicines, herbs, non-prescription drugs, or dietary supplements you use. Also tell them if you smoke, drink alcohol, or use illegal drugs. Some items may interact with your medicine. What should I watch for while using this medicine? Tell your doctor if your symptoms do not get better or if they  get worse. Visit your doctor or health care professional for regular checks on your progress. Because it may take several weeks to see the full   effects of this medicine, it is important to continue your treatment as prescribed by your doctor. Patients and their families should watch out for new or worsening thoughts of suicide or depression. Also watch out for sudden changes in feelings such as feeling anxious, agitated, panicky, irritable, hostile, aggressive, impulsive, severely restless, overly excited and hyperactive, or not being able to sleep. If this happens, especially at the beginning of treatment or after a change in dose, call your health care professional. You may get drowsy or dizzy. Do not drive, use machinery, or do anything that needs mental alertness until you know how this medicine affects you. Do not stand or sit up quickly, especially if you are an older patient. This reduces the risk of dizzy or fainting spells. Alcohol may interfere with the effect of this medicine. Avoid alcoholic drinks. Your mouth may get dry. Chewing sugarless gum or sucking hard candy, and drinking plenty of water will help. Contact your doctor if the problem does not go away or is severe. What side effects may I notice from receiving this medicine? Side effects that you should report to your doctor or health care professional as soon as possible: -allergic reactions like skin rash, itching or hives, swelling of the face, lips, or tongue -anxious -black, tarry stools -breathing problems -changes in vision -chest pain -confusion -elevated mood, decreased need for sleep, racing thoughts, impulsive behavior -eye pain -fast, irregular heartbeat -feeling faint or lightheaded, falls -feeling agitated, angry, or irritable -hallucination, loss of contact with reality -loss of balance or coordination -loss of memory -painful or prolonged erections -restlessness, pacing, inability to keep  still -seizures -stiff muscles -suicidal thoughts or other mood changes -trouble sleeping -unusual bleeding or bruising -unusually weak or tired -vomiting Side effects that usually do not require medical attention (report to your doctor or health care professional if they continue or are bothersome): -change in appetite or weight -change in sex drive or performance -dizziness -headache -increased sweating -indigestion, nausea -tremors This list may not describe all possible side effects. Call your doctor for medical advice about side effects. You may report side effects to FDA at 1-800-FDA-1088. Where should I keep my medicine? Keep out of reach of children. Store at room temperature between 15 and 30 degrees C (59 and 86 degrees F). Throw away any unused medicine after the expiration date. NOTE: This sheet is a summary. It may not cover all possible information. If you have questions about this medicine, talk to your doctor, pharmacist, or health care provider.  2018 Elsevier/Gold Standard (2015-06-30 13:18:52)  

## 2017-08-11 ENCOUNTER — Other Ambulatory Visit: Payer: Self-pay | Admitting: Family Medicine

## 2017-08-11 DIAGNOSIS — I10 Essential (primary) hypertension: Secondary | ICD-10-CM

## 2017-08-12 NOTE — Telephone Encounter (Signed)
[id number] see refill req/thx dmf 

## 2017-08-17 ENCOUNTER — Ambulatory Visit (INDEPENDENT_AMBULATORY_CARE_PROVIDER_SITE_OTHER): Payer: 59

## 2017-08-17 DIAGNOSIS — I739 Peripheral vascular disease, unspecified: Secondary | ICD-10-CM

## 2017-08-24 ENCOUNTER — Telehealth: Payer: Self-pay | Admitting: *Deleted

## 2017-08-24 DIAGNOSIS — I739 Peripheral vascular disease, unspecified: Secondary | ICD-10-CM

## 2017-08-24 NOTE — Telephone Encounter (Signed)
Left a message for the patient to call back for results. Repeat orders have been placed.

## 2017-08-24 NOTE — Telephone Encounter (Signed)
-----   Message from [place] OuchMuhammad A Arida, MD sent at 08/19/2017 10:56 AM EDT ----- Inform patient that ABI was normal bilaterally with patent iliac stents.  However, there is more scarring in the stents compared to last year.  This has to be monitored.  Repeat same studies in 1 year.

## 2017-08-26 NOTE — Telephone Encounter (Signed)
Patient made aware of results and verbalized understanding.  

## 2017-09-07 ENCOUNTER — Ambulatory Visit: Payer: 59 | Admitting: Physician Assistant

## 2017-09-07 ENCOUNTER — Encounter: Payer: Self-pay | Admitting: Physician Assistant

## 2017-09-07 VITALS — BP 140/70 | HR 73 | Temp 98.0°F | Resp 16 | Wt 211.6 lb

## 2017-09-07 DIAGNOSIS — E119 Type 2 diabetes mellitus without complications: Secondary | ICD-10-CM

## 2017-09-07 LAB — POCT GLYCOSYLATED HEMOGLOBIN (HGB A1C)
Est. average glucose Bld gHb Est-mCnc: 240
HEMOGLOBIN A1C: 10 % — AB (ref 4.0–5.6)

## 2017-09-07 MED ORDER — METFORMIN HCL 1000 MG PO TABS: 1000.0000 mg | ORAL_TABLET | Freq: Two times a day (BID) | ORAL | 1 refills | Status: DC

## 2017-09-07 NOTE — Progress Notes (Signed)
Patient: Sara Roy Female    DOB: [birth year]   57 y.o.   MRN: [id number] Visit Date: 09/07/2017  Today's Provider: Mar Daring, PA-C   Chief Complaint  Patient presents with  . Follow-up    DM and Anxiety   Subjective:    HPI Patient here today for 4 week follow-up DM and Anxiety.   Diabetes, Follow up:  The patient was last seen for Diabetes 4 weeks ago. Changes made since that visit include Stop Trulicity and change to Metformin '1000mg'$  BID.  She reports excellent compliance with treatment. She is not having side effects. She reports she feels good. Denies Polydipsia and Polyuria. Last Eye exam: Not up to date. Reports sugar levels are in between 100's-150 fasting. Reports she got 107 this AM.  Lab Results  Component Value Date   HGBA1C 10.0 (A) 09/07/2017   HGBA1C 13.3 (H) 08/03/2017   HGBA1C 14.0 (A) 08/03/2017    Wt Readings from Last 3 Encounters:  09/07/17 211 lb 9.6 oz (96 kg)  08/10/17 217 lb (98.4 kg)  08/04/17 219 lb 8 oz (99.6 kg)    Anxiety: Patient reports that she feels is stable.She reports that she took the Medication (Celexa) but made her feel sleepy so she stop after taking it for a few days. Overall she reports her job is much better now. 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 She is feeling much better now.      Allergies  Allergen Reactions  . Lisinopril     Rash   . Penicillins Hives  . Losartan Rash     Current Outpatient Medications:  .  albuterol (PROVENTIL HFA;VENTOLIN HFA) 108 (90 Base) MCG/ACT inhaler, Inhale 2 puffs into the lungs every 6 (six) hours as needed for wheezing or shortness of breath., Disp: 1 Inhaler, Rfl: 0 .  ALPRAZolam (XANAX) 0.25 MG tablet, TAKE 1 TABLET BY MOUTH THREE TIMES DAILY, Disp: 90 tablet, Rfl: 2 .  amLODipine (NORVASC) 5 MG tablet, TAKE 1 TABLET BY MOUTH DAILY, Disp: 90 tablet, Rfl: 1 .  aspirin EC 81 MG tablet, Take 1 tablet  (81 mg total) by mouth daily., Disp: 90 tablet, Rfl: 3 .  atorvastatin (LIPITOR) 40 MG tablet, TAKE 1 TABLET BY MOUTH EVERY DAY, Disp: 90 tablet, Rfl: 3 .  Blood Glucose Monitoring Suppl (ONETOUCH VERIO) w/Device KIT, 1 kit by Does not apply route daily before breakfast., Disp: 1 kit, Rfl: 0 .  fluticasone furoate-vilanterol (BREO ELLIPTA) 200-25 MCG/INH AEPB, Inhale 1 puff into the lungs daily., Disp: 60 each, Rfl: 5 .  glucose blood (ONETOUCH VERIO) test strip, To check BS once daily, Disp: 100 each, Rfl: 12 .  hydrochlorothiazide (HYDRODIURIL) 25 MG tablet, TAKE 1 TABLET BY MOUTH EVERY DAY, Disp: 90 tablet, Rfl: 1 .  Lancets (ONETOUCH ULTRASOFT) lancets, To check BS once daily, Disp: 100 each, Rfl: 12 .  metFORMIN (GLUCOPHAGE) 1000 MG tablet, Take 1 tablet (1,000 mg total) by mouth 2 (two) times daily with a meal., Disp: 180 tablet, Rfl: 0 .  mometasone (NASONEX) 50 MCG/ACT nasal spray, Place 2 sprays into the nose daily., Disp: 17 g, Rfl: 12 .  nystatin (MYCOSTATIN) 100000 UNIT/ML suspension, Take 5 mLs (500,000 Units total) by mouth 4 (four) times daily., Disp: 60 mL, Rfl: 0 .  potassium chloride (K-DUR) 10 MEQ tablet, Take 1 tablet (10 mEq total) by mouth daily., Disp: 90 tablet, Rfl:  1 .  citalopram (CELEXA) 20 MG tablet, Take 1 tablet (20 mg total) by mouth daily. (Patient not taking: Reported on 09/07/2017), Disp: 30 tablet, Rfl: 0  Review of Systems  Constitutional: Negative.   Eyes: Negative for visual disturbance.  Respiratory: Positive for cough. Negative for chest tightness, shortness of breath and wheezing.   Cardiovascular: Positive for leg swelling ("ankle"). Negative for chest pain and palpitations.  Endocrine: Negative for polydipsia and polyuria.  Psychiatric/Behavioral: Negative.     Social History   Tobacco Use  . Smoking status: Current Every Day Smoker    Packs/day: 1.50    Years: 40.00    Pack years: 60.00    Types: Cigarettes  . Smokeless tobacco: Never Used    Substance Use Topics  . Alcohol use: No    Comment: Quit 6 years ago   Objective:   BP 140/70 (BP Location: Left Arm, Patient Position: Sitting, Cuff Size: Normal)   Pulse 73   Temp 98 F (36.7 C) (Oral)   Resp 16   Wt 211 lb 9.6 oz (96 kg)   BMI 33.14 kg/m  Vitals:   09/07/17 0820  BP: 140/70  Pulse: 73  Resp: 16  Temp: 98 F (36.7 C)  TempSrc: Oral  Weight: 211 lb 9.6 oz (96 kg)     Physical Exam  Constitutional: She appears well-developed and well-nourished. No distress.  Neck: Normal range of motion. Neck supple. No JVD present. No tracheal deviation present. No thyromegaly present.  Cardiovascular: Normal rate, regular rhythm and normal heart sounds. Exam reveals no gallop and no friction rub.  No murmur heard. Pulmonary/Chest: Effort normal. No respiratory distress. She has wheezes. She has no rales.  Musculoskeletal: She exhibits no edema.  Lymphadenopathy:    She has no cervical adenopathy.  Skin: She is not diaphoretic.  Vitals reviewed.      Assessment & Plan:     1. Type 2 diabetes mellitus without complication, without long-term current use of insulin (HCC) A1c improving and down from 13.3 to 10.0. Continue lifestyle modifications and metformin '1000mg'$  BID. I will see her back in 3 months for recheck.  - POCT glycosylated hemoglobin (Hb A1C) - metFORMIN (GLUCOPHAGE) 1000 MG tablet; Take 1 tablet (1,000 mg total) by mouth 2 (two) times daily with a meal.  Dispense: 180 tablet; Refill: [place], PA-C  [facility]

## 2017-09-07 NOTE — Patient Instructions (Signed)
Diabetes Mellitus and Nutrition When you have diabetes (diabetes mellitus), it is very important to have healthy eating habits because your blood sugar (glucose) levels are greatly affected by what you eat and drink. Eating healthy foods in the appropriate amounts, at about the same times every day, can help you:  Control your blood glucose.  Lower your risk of heart disease.  Improve your blood pressure.  Reach or maintain a healthy weight.  Every person with diabetes is different, and each person has different needs for a meal plan. Your health care provider may recommend that you work with a diet and nutrition specialist (dietitian) to make a meal plan that is best for you. Your meal plan may vary depending on factors such as:  The calories you need.  The medicines you take.  Your weight.  Your blood glucose, blood pressure, and cholesterol levels.  Your activity level.  Other health conditions you have, such as heart or kidney disease.  How do carbohydrates affect me? Carbohydrates affect your blood glucose level more than any other type of food. Eating carbohydrates naturally increases the amount of glucose in your blood. Carbohydrate counting is a method for keeping track of how many carbohydrates you eat. Counting carbohydrates is important to keep your blood glucose at a healthy level, especially if you use insulin or take certain oral diabetes medicines. It is important to know how many carbohydrates you can safely have in each meal. This is different for every person. Your dietitian can help you calculate how many carbohydrates you should have at each meal and for snack. Foods that contain carbohydrates include:  Bread, cereal, rice, pasta, and crackers.  Potatoes and corn.  Peas, beans, and lentils.  Milk and yogurt.  Fruit and juice.  Desserts, such as cakes, cookies, ice cream, and candy.  How does alcohol affect me? Alcohol can cause a sudden decrease in blood  glucose (hypoglycemia), especially if you use insulin or take certain oral diabetes medicines. Hypoglycemia can be a life-threatening condition. Symptoms of hypoglycemia (sleepiness, dizziness, and confusion) are similar to symptoms of having too much alcohol. If your health care provider says that alcohol is safe for you, follow these guidelines:  Limit alcohol intake to no more than 1 drink per day for nonpregnant women and 2 drinks per day for men. One drink equals 12 oz of beer, 5 oz of wine, or 1 oz of hard liquor.  Do not drink on an empty stomach.  Keep yourself hydrated with water, diet soda, or unsweetened iced tea.  Keep in mind that regular soda, juice, and other mixers may contain a lot of sugar and must be counted as carbohydrates.  What are tips for following this plan? Reading food labels  Start by checking the serving size on the label. The amount of calories, carbohydrates, fats, and other nutrients listed on the label are based on one serving of the food. Many foods contain more than one serving per package.  Check the total grams (g) of carbohydrates in one serving. You can calculate the number of servings of carbohydrates in one serving by dividing the total carbohydrates by 15. For example, if a food has 30 g of total carbohydrates, it would be equal to 2 servings of carbohydrates.  Check the number of grams (g) of saturated and trans fats in one serving. Choose foods that have low or no amount of these fats.  Check the number of milligrams (mg) of sodium in one serving. Most people   should limit total sodium intake to less than 2,300 mg per day.  Always check the nutrition information of foods labeled as "low-fat" or "nonfat". These foods may be higher in added sugar or refined carbohydrates and should be avoided.  Talk to your dietitian to identify your daily goals for nutrients listed on the label. Shopping  Avoid buying canned, premade, or processed foods. These  foods tend to be high in fat, sodium, and added sugar.  Shop around the outside edge of the grocery store. This includes fresh fruits and vegetables, bulk grains, fresh meats, and fresh dairy. Cooking  Use low-heat cooking methods, such as baking, instead of high-heat cooking methods like deep frying.  Cook using healthy oils, such as olive, canola, or sunflower oil.  Avoid cooking with butter, cream, or high-fat meats. Meal planning  Eat meals and snacks regularly, preferably at the same times every day. Avoid going long periods of time without eating.  Eat foods high in fiber, such as fresh fruits, vegetables, beans, and whole grains. Talk to your dietitian about how many servings of carbohydrates you can eat at each meal.  Eat 4-6 ounces of lean protein each day, such as lean meat, chicken, fish, eggs, or tofu. 1 ounce is equal to 1 ounce of meat, chicken, or fish, 1 egg, or 1/4 cup of tofu.  Eat some foods each day that contain healthy fats, such as avocado, nuts, seeds, and fish. Lifestyle   Check your blood glucose regularly.  Exercise at least 30 minutes 5 or more days each week, or as told by your health care provider.  Take medicines as told by your health care provider.  Do not use any products that contain nicotine or tobacco, such as cigarettes and e-cigarettes. If you need help quitting, ask your health care provider.  Work with a counselor or diabetes educator to identify strategies to manage stress and any emotional and social challenges. What are some questions to ask my health care provider?  Do I need to meet with a diabetes educator?  Do I need to meet with a dietitian?  What number can I call if I have questions?  When are the best times to check my blood glucose? Where to find more information:  American Diabetes Association: diabetes.org/food-and-fitness/food  Academy of Nutrition and Dietetics:  www.eatright.org/resources/health/diseases-and-conditions/diabetes  National Institute of Diabetes and Digestive and Kidney Diseases (NIH): www.niddk.nih.gov/health-information/diabetes/overview/diet-eating-physical-activity Summary  A healthy meal plan will help you control your blood glucose and maintain a healthy lifestyle.  Working with a diet and nutrition specialist (dietitian) can help you make a meal plan that is best for you.  Keep in mind that carbohydrates and alcohol have immediate effects on your blood glucose levels. It is important to count carbohydrates and to use alcohol carefully. This information is not intended to replace advice given to you by your health care provider. Make sure you discuss any questions you have with your health care provider. Document Released: 10/22/2004 Document Revised: 03/01/2016 Document Reviewed: 03/01/2016 Elsevier Interactive Patient Education  2018 Elsevier Inc.  

## 2017-10-25 ENCOUNTER — Encounter: Payer: Self-pay | Admitting: Cardiovascular Disease

## 2017-10-25 ENCOUNTER — Ambulatory Visit: Payer: 59 | Admitting: Cardiovascular Disease

## 2017-10-25 VITALS — BP 130/70 | HR 82 | Ht 67.0 in | Wt 197.2 lb

## 2017-10-25 DIAGNOSIS — E785 Hyperlipidemia, unspecified: Secondary | ICD-10-CM | POA: Diagnosis not present

## 2017-10-25 DIAGNOSIS — I739 Peripheral vascular disease, unspecified: Secondary | ICD-10-CM

## 2017-10-25 DIAGNOSIS — Z720 Tobacco use: Secondary | ICD-10-CM

## 2017-10-25 DIAGNOSIS — I10 Essential (primary) hypertension: Secondary | ICD-10-CM

## 2017-10-25 NOTE — Patient Instructions (Signed)
Medication Instructions: Your physician recommends that you continue on your current medications as directed. Please refer to the Current Medication list given to you today.  If you need a refill on your cardiac medications before your next appointment, please call your pharmacy.   Follow-Up: Your physician wants you to follow-up in 6 months with Dr. Arida. You will receive a reminder letter in the mail two months in advance. If you don't receive a letter, please call our office at [phone] to schedule this follow-up appointment.  Thank you for choosing [facility]!    

## 2017-10-25 NOTE — Progress Notes (Signed)
Cardiology Office Note   Date:  10/25/2017   ID:  Sara Roy, DOB 09/25/1960, MRN [id number]  PCP:  Mar Daring, PA-C  Cardiologist:   Kathlyn Sacramento, MD   Chief Complaint  Patient presents with  . other    3 month f/u lower extremity US no complaints today. Meds reviewed verbally with pt.      History of Present Illness: Sara Roy is a 57 y.o. female who presents for  a follow up visit regarding peripheral arterial disease. She has no previous cardiac history. She has chronic medical conditions that include hypertension, hyperlipidemia and prolonged tobacco use. She smokes one half pack per day and has been doing so for more than 40 years. She is s/p bilateral kissing stent placement to both common iliac arteries into the distal aorta in September 2015 for severe claudication. She had an echocardiogram done in June 2016 for a heart murmur which showed normal LV systolic function with no significant valvular abnormalities. The aortic valve was not well-visualized.  She was diagnosed with type 2 diabetes few months ago with blood sugar greater than 600.  She was started on oral hypoglycemic with subsequent improvement.  She also had symptoms of bronchitis that subsequently improved.  She actually has been doing very well with no recent chest pain, shortness of breath or palpitations.  She reports that her leg claudications are minimal. She had vascular studies done in July which showed normal ABI bilaterally.  Duplex showed moderate restenosis in the common iliac stents with velocities in the 300 range.  Past Medical History:  Diagnosis Date  . Allergy   . Anemia   . Anxiety   . Family history of early CAD   . Heart murmur   . HOH (hard of hearing)    reads lips wears hearing aids  . Hyperlipidemia   . Hypertension   . Peripheral vascular disease ([place])   . Substance abuse ([place])    Quit drinking alcohol 6 years ago  . Tobacco abuse     Past Surgical  History:  Procedure Laterality Date  . ABDOMINAL AORTAGRAM N/A 10/10/2013   Procedure: ABDOMINAL Maxcine Ham;  Surgeon: Wellington [place], MD;  Location: [facility];  Service: Cardiovascular;  Laterality: N/A;  . ABDOMINAL HYSTERECTOMY    . CHOLECYSTECTOMY    . COLONOSCOPY WITH PROPOFOL N/A 10/16/2015   Procedure: COLONOSCOPY WITH PROPOFOL;  Surgeon: Lucilla Lame, MD;  Location: [facility];  Service: Endoscopy;  Laterality: N/A;  . LOWER EXTREMITY ANGIOGRAM Bilateral 10/10/2013   ILIAC   BILATERAL   BY DR ARDA  . POLYPECTOMY  10/16/2015   Procedure: POLYPECTOMY;  Surgeon: Lucilla Lame, MD;  Location: [facility];  Service: Endoscopy;;  . SKIN CANCER EXCISION       Current Outpatient Medications  Medication Sig Dispense Refill  . albuterol (PROVENTIL HFA;VENTOLIN HFA) 108 (90 Base) MCG/ACT inhaler Inhale 2 puffs into the lungs every 6 (six) hours as needed for wheezing or shortness of breath. 1 Inhaler 0  . ALPRAZolam (XANAX) 0.25 MG tablet TAKE 1 TABLET BY MOUTH THREE TIMES DAILY 90 tablet 2  . amLODipine (NORVASC) 5 MG tablet TAKE 1 TABLET BY MOUTH DAILY 90 tablet 1  . aspirin EC 81 MG tablet Take 1 tablet (81 mg total) by mouth daily. 90 tablet 3  . atorvastatin (LIPITOR) 40 MG tablet TAKE 1 TABLET BY MOUTH EVERY DAY 90 tablet 3  . Blood Glucose Monitoring Suppl (ONETOUCH VERIO) w/Device  KIT 1 kit by Does not apply route daily before breakfast. 1 kit 0  . fluticasone furoate-vilanterol (BREO ELLIPTA) 200-25 MCG/INH AEPB Inhale 1 puff into the lungs daily. 60 each 5  . glucose blood (ONETOUCH VERIO) test strip To check BS once daily 100 each 12  . hydrochlorothiazide (HYDRODIURIL) 25 MG tablet TAKE 1 TABLET BY MOUTH EVERY DAY 90 tablet 1  . Lancets (ONETOUCH ULTRASOFT) lancets To check BS once daily 100 each 12  . metFORMIN (GLUCOPHAGE) 1000 MG tablet Take 1 tablet (1,000 mg total) by mouth 2 (two) times daily with a meal. 180 tablet 1  . mometasone (NASONEX) 50 MCG/ACT nasal  spray Place 2 sprays into the nose daily. 17 g 12  . nystatin (MYCOSTATIN) 100000 UNIT/ML suspension Take 5 mLs (500,000 Units total) by mouth 4 (four) times daily. 60 mL 0  . potassium chloride (K-DUR) 10 MEQ tablet Take 1 tablet (10 mEq total) by mouth daily. 90 tablet 1   No current facility-administered medications for this visit.     Allergies:   Lisinopril; Penicillins; and Losartan    Social History:  The patient  reports that she has been smoking cigarettes. She has a 60.00 pack-year smoking history. She has never used smokeless tobacco. She reports that she has current or past drug history. Drug: Marijuana. She reports that she does not drink alcohol.   Family History:  The patient's family history includes Cancer in her mother; Heart disease in her father; Hemachromatosis in her other; Hodgkin's lymphoma in her cousin.    ROS:  Please see the history of present illness.   Otherwise, review of systems are positive for none.   All other systems are reviewed and negative.    PHYSICAL EXAM: VS:  BP 130/70 (BP Location: Left Arm, Patient Position: Sitting, Cuff Size: Normal)   Pulse 82   Ht '5\' 7"'$  (1.702 m)   Wt 197 lb 4 oz (89.5 kg)   BMI 30.89 kg/m  , BMI Body mass index is 30.89 kg/m. GEN: Well nourished, well developed, in no acute distress  HEENT: normal  Neck: no JVD, carotid bruits, or masses Cardiac: RRR; no rubs, or gallops,no edema . 2/6 crescendo decrescendo systolic murmur in the aortic area which is early peaking. Respiratory:  clear to auscultation bilaterally, normal work of breathing GI: soft, nontender, nondistended, + BS MS: no deformity or atrophy  Skin: warm and dry, no rash Neuro:  Strength and sensation are intact Psych: euthymic mood, full affect Bilateral posterior tibial pulse is palpable.   EKG:  EKG is not ordered today.  Recent Labs: 04/14/2017: ALT 24; Hemoglobin 14.1; Platelets 329 08/03/2017: BUN 10; Creatinine, Ser 0.92; Potassium 3.9;  Sodium 135    Lipid Panel    Component Value Date/Time   CHOL 132 03/10/2015 1128   CHOL 139 10/26/2013 0757   TRIG 187.0 (H) 03/10/2015 1128   HDL 36.00 (L) 03/10/2015 1128   HDL 41 10/26/2013 0757   CHOLHDL 4 03/10/2015 1128   VLDL 37.4 03/10/2015 1128   LDLCALC 58 03/10/2015 1128   LDLCALC 76 10/26/2013 0757   LDLDIRECT 162.7 08/18/2011 1007      Wt Readings from Last 3 Encounters:  10/25/17 197 lb 4 oz (89.5 kg)  09/07/17 211 lb 9.6 oz (96 kg)  08/10/17 217 lb (98.4 kg)        ASSESSMENT AND PLAN:  1.  Peripheral arterial disease: Status post bilateral common iliac artery stent placement .  Mild bilateral claudication due  to moderate in-stent restenosis.  Symptoms are not lifestyle limiting.  Continue medical therapy.  Repeat vascular studies in July of next year.  2. Essential hypertension:  Blood pressure is controlled on current medications.  3. Hyperlipidemia: Continue treatment with atorvastatin 40 mg once daily. Most recent lipid profile showed an LDL of 58.  4. Tobacco use: I again discussed with her the importance of smoking cessation.    Disposition:   FU with me in  6 months  Signed,  Kathlyn Sacramento, MD  10/25/2017 1:31 PM    [place]

## 2017-12-07 ENCOUNTER — Other Ambulatory Visit: Payer: Self-pay | Admitting: Physician Assistant

## 2017-12-07 DIAGNOSIS — I10 Essential (primary) hypertension: Secondary | ICD-10-CM

## 2017-12-08 NOTE — Telephone Encounter (Signed)
Sara Roy patient

## 2017-12-12 ENCOUNTER — Ambulatory Visit (INDEPENDENT_AMBULATORY_CARE_PROVIDER_SITE_OTHER): Payer: 59 | Admitting: Physician Assistant

## 2017-12-12 ENCOUNTER — Encounter: Payer: Self-pay | Admitting: Physician Assistant

## 2017-12-12 VITALS — BP 140/80 | HR 75 | Temp 98.6°F | Resp 16 | Wt 183.8 lb

## 2017-12-12 DIAGNOSIS — Z114 Encounter for screening for human immunodeficiency virus [HIV]: Secondary | ICD-10-CM | POA: Diagnosis not present

## 2017-12-12 DIAGNOSIS — E119 Type 2 diabetes mellitus without complications: Secondary | ICD-10-CM | POA: Diagnosis not present

## 2017-12-12 DIAGNOSIS — Z1159 Encounter for screening for other viral diseases: Secondary | ICD-10-CM

## 2017-12-12 DIAGNOSIS — Z2821 Immunization not carried out because of patient refusal: Secondary | ICD-10-CM | POA: Diagnosis not present

## 2017-12-12 NOTE — Progress Notes (Signed)
Patient: Sara Roy Female    DOB: 03/02/1960   57 y.o.   MRN: [id number] Visit Date: 12/12/2017  Today's Provider: Mar Daring, PA-C   Chief Complaint  Patient presents with  . Follow-up    T2DM   Subjective:    HPI  Diabetes Mellitus Type II, Follow-up:   Lab Results  Component Value Date   HGBA1C 10.0 (A) 09/07/2017   HGBA1C 13.3 (H) 08/03/2017   HGBA1C 14.0 (A) 08/03/2017    Last seen for diabetes 3 months ago.  Management since then includes Continue lifestyle modifications and Metformin 1000 mg BID. She reports excellent compliance with treatment. She is not having side effects.  Current symptoms include none and have been stable. Home blood sugar records: 110's-90's  Episodes of hypoglycemia? no   Current Insulin Regimen: none Most Recent Eye Exam:  Weight trend: stable Current diet: in general, a "healthy" diet   Current exercise: walking  Pertinent Labs:    Component Value Date/Time   CHOL 132 03/10/2015 1128   CHOL 139 10/26/2013 0757   TRIG 187.0 (H) 03/10/2015 1128   HDL 36.00 (L) 03/10/2015 1128   HDL 41 10/26/2013 0757   LDLCALC 58 03/10/2015 1128   LDLCALC 76 10/26/2013 0757   CREATININE 0.92 08/03/2017 1046   CREATININE 0.78 07/22/2011 1926    Wt Readings from Last 3 Encounters:  12/12/17 183 lb 12.8 oz (83.4 kg)  10/25/17 197 lb 4 oz (89.5 kg)  09/07/17 211 lb 9.6 oz (96 kg)   ------------------------------------------------------------------------     Allergies  Allergen Reactions  . Lisinopril     Rash   . Penicillins Hives  . Losartan Rash     Current Outpatient Medications:  .  ALPRAZolam (XANAX) 0.25 MG tablet, TAKE 1 TABLET BY MOUTH THREE TIMES DAILY, Disp: 90 tablet, Rfl: 2 .  amLODipine (NORVASC) 5 MG tablet, TAKE 1 TABLET BY MOUTH DAILY, Disp: 90 tablet, Rfl: 1 .  aspirin EC 81 MG tablet, Take 1 tablet (81 mg total) by mouth daily., Disp: 90 tablet, Rfl: 3 .  atorvastatin (LIPITOR) 40 MG tablet, TAKE  1 TABLET BY MOUTH EVERY DAY, Disp: 90 tablet, Rfl: 3 .  Blood Glucose Monitoring Suppl (ONETOUCH VERIO) w/Device KIT, 1 kit by Does not apply route daily before breakfast., Disp: 1 kit, Rfl: 0 .  fluticasone furoate-vilanterol (BREO ELLIPTA) 200-25 MCG/INH AEPB, Inhale 1 puff into the lungs daily., Disp: 60 each, Rfl: 5 .  glucose blood (ONETOUCH VERIO) test strip, To check BS once daily, Disp: 100 each, Rfl: 12 .  hydrochlorothiazide (HYDRODIURIL) 25 MG tablet, TAKE 1 TABLET BY MOUTH EVERY DAY, Disp: 90 tablet, Rfl: 1 .  Lancets (ONETOUCH ULTRASOFT) lancets, To check BS once daily, Disp: 100 each, Rfl: 12 .  metFORMIN (GLUCOPHAGE) 1000 MG tablet, Take 1 tablet (1,000 mg total) by mouth 2 (two) times daily with a meal., Disp: 180 tablet, Rfl: 1 .  potassium chloride (K-DUR) 10 MEQ tablet, Take 1 tablet (10 mEq total) by mouth daily., Disp: 90 tablet, Rfl: 1 .  albuterol (PROVENTIL HFA;VENTOLIN HFA) 108 (90 Base) MCG/ACT inhaler, Inhale 2 puffs into the lungs every 6 (six) hours as needed for wheezing or shortness of breath. (Patient not taking: Reported on 12/12/2017), Disp: 1 Inhaler, Rfl: 0 .  mometasone (NASONEX) 50 MCG/ACT nasal spray, Place 2 sprays into the nose daily. (Patient not taking: Reported on 12/12/2017), Disp: 17 g, Rfl: 12 .  nystatin (MYCOSTATIN) 100000 UNIT/ML suspension,  Take 5 mLs (500,000 Units total) by mouth 4 (four) times daily. (Patient not taking: Reported on 12/12/2017), Disp: 60 mL, Rfl: 0  Review of Systems  Constitutional: Negative.   Respiratory: Negative.   Cardiovascular: Negative.   Neurological: Negative.   Psychiatric/Behavioral: Negative.     Social History   Tobacco Use  . Smoking status: Current Every Day Smoker    Packs/day: 1.50    Years: 40.00    Pack years: 60.00    Types: Cigarettes  . Smokeless tobacco: Never Used  Substance Use Topics  . Alcohol use: No    Comment: Quit 6 years ago   Objective:   BP 140/80 (BP Location: Left Arm, Patient  Position: Sitting, Cuff Size: Normal)   Pulse 75   Temp 98.6 F (37 C) (Oral)   Resp 16   Wt 183 lb 12.8 oz (83.4 kg)   BMI 28.79 kg/m  Vitals:   12/12/17 0827  BP: 140/80  Pulse: 75  Resp: 16  Temp: 98.6 F (37 C)  TempSrc: Oral  Weight: 183 lb 12.8 oz (83.4 kg)     Physical Exam  Constitutional: She appears well-developed and well-nourished. No distress.  Neck: Normal range of motion. Neck supple.  Cardiovascular: Normal rate, regular rhythm and normal heart sounds. Exam reveals no gallop and no friction rub.  No murmur heard. Pulmonary/Chest: Effort normal and breath sounds normal. No respiratory distress. She has no wheezes. She has no rales.  Skin: She is not diaphoretic.  Vitals reviewed.  Diabetic Foot Exam - Simple   Simple Foot Form Diabetic Foot exam was performed with the following findings:  Yes 12/12/2017  8:59 AM  Visual Inspection No deformities, no ulcerations, no other skin breakdown bilaterally:  Yes Sensation Testing Intact to touch and monofilament testing bilaterally:  Yes Pulse Check Posterior Tibialis and Dorsalis pulse intact bilaterally:  Yes Comments       Assessment & Plan:     1. Type 2 diabetes mellitus without complication, without long-term current use of insulin (HCC) POCT a1c machine down. Labs ordered as below. I will f/u pending results. Patient has lost 13 pounds since last visit with lifestyle modifications. Fasting sugar numbers have averaged in the upper 90s to low 100s. If A1c is less than 7 will decrease to metformin 1043m once daily. I will see her back in 6 months for A1c recheck.  FYI: patient declines CPE at this time. - HgB AG6Y- Basic Metabolic Panel (BMET) - CBC w/Diff/Platelet  2. Encounter for hepatitis C screening test for low risk patient Will check labs as below and f/u pending results. - Hepatitis C Antibody  3. Screening for HIV without presence of risk factors Will check labs as below and f/u pending  results. - HIV antibody (with reflex)  4. Influenza vaccination declined  5. 23-polyvalent pneumococcal polysaccharide vaccine declined       JMar Daring PA-C  [facility]

## 2017-12-13 ENCOUNTER — Telehealth: Payer: Self-pay

## 2017-12-13 LAB — CBC WITH DIFFERENTIAL/PLATELET
Basophils Absolute: 0.1 10*3/uL (ref 0.0–0.2)
Basos: 1 %
EOS (ABSOLUTE): 0.2 10*3/uL (ref 0.0–0.4)
Eos: 2 %
Hematocrit: 46 % (ref 34.0–46.6)
Hemoglobin: 15.4 g/dL (ref 11.1–15.9)
Immature Grans (Abs): 0.1 10*3/uL (ref 0.0–0.1)
Immature Granulocytes: 1 %
LYMPHS ABS: 2.8 10*3/uL (ref 0.7–3.1)
LYMPHS: 21 %
MCH: 26.4 pg — AB (ref 26.6–33.0)
MCHC: 33.5 g/dL (ref 31.5–35.7)
MCV: 79 fL (ref 79–97)
MONOS ABS: 0.7 10*3/uL (ref 0.1–0.9)
Monocytes: 5 %
NEUTROS ABS: 9.6 10*3/uL — AB (ref 1.4–7.0)
Neutrophils: 70 %
PLATELETS: 401 10*3/uL (ref 150–450)
RBC: 5.84 x10E6/uL — ABNORMAL HIGH (ref 3.77–5.28)
RDW: 16.9 % — AB (ref 12.3–15.4)
WBC: 13.5 10*3/uL — ABNORMAL HIGH (ref 3.4–10.8)

## 2017-12-13 LAB — HEMOGLOBIN A1C
Est. average glucose Bld gHb Est-mCnc: 123 mg/dL
HEMOGLOBIN A1C: 5.9 % — AB (ref 4.8–5.6)

## 2017-12-13 LAB — BASIC METABOLIC PANEL
BUN / CREAT RATIO: 33 — AB (ref 9–23)
BUN: 22 mg/dL (ref 6–24)
CALCIUM: 10.3 mg/dL — AB (ref 8.7–10.2)
CHLORIDE: 98 mmol/L (ref 96–106)
CO2: 23 mmol/L (ref 20–29)
Creatinine, Ser: 0.67 mg/dL (ref 0.57–1.00)
GFR calc non Af Amer: 97 mL/min/{1.73_m2} (ref >59)
GFR, EST AFRICAN AMERICAN: 111 mL/min/{1.73_m2} (ref >59)
Glucose: 88 mg/dL (ref 65–99)
POTASSIUM: 3.9 mmol/L (ref 3.5–5.2)
Sodium: 141 mmol/L (ref 134–144)

## 2017-12-13 LAB — HIV ANTIBODY (ROUTINE TESTING W REFLEX): HIV Screen 4th Generation wRfx: NONREACTIVE

## 2017-12-13 LAB — HEPATITIS C ANTIBODY

## 2017-12-13 NOTE — Telephone Encounter (Signed)
Patient was advised.  

## 2017-12-13 NOTE — Telephone Encounter (Signed)
-----   Message from Margaretann Loveless, PA-C sent at 12/13/2017  2:36 PM EST ----- A1c is down to 5.9!!!!! [place] job. Back down to metformin 1000mg  once daily now. All other labs are stable

## 2018-01-23 ENCOUNTER — Telehealth: Payer: Self-pay | Admitting: Physician Assistant

## 2018-01-23 NOTE — Telephone Encounter (Signed)
Patient advised as below.  

## 2018-01-23 NOTE — Telephone Encounter (Signed)
Pt calling regarding her sinus is congested.  Asking what she can take over the counter since she has diabetes? She also has mometasone (NASONEX) 50 MCG/ACT nasal spray that was prescribed.  Asking if this would help as well?  Please advise.  Thanks, [employer]

## 2018-01-23 NOTE — Telephone Encounter (Signed)
Yes nasonex will help. Also saline irrigations. She can also use coricidin HBP or mucinex (plain) or Mucinex DM if she has a cough. Just has to avoid cough syrups OTC.

## 2018-02-01 ENCOUNTER — Other Ambulatory Visit: Payer: Self-pay | Admitting: Family Medicine

## 2018-02-01 DIAGNOSIS — F419 Anxiety disorder, unspecified: Secondary | ICD-10-CM

## 2018-02-18 ENCOUNTER — Other Ambulatory Visit: Payer: Self-pay | Admitting: Physician Assistant

## 2018-02-18 DIAGNOSIS — I10 Essential (primary) hypertension: Secondary | ICD-10-CM

## 2018-04-28 ENCOUNTER — Ambulatory Visit: Payer: 59 | Admitting: Cardiovascular Disease

## 2018-04-30 ENCOUNTER — Other Ambulatory Visit: Payer: Self-pay | Admitting: Cardiovascular Disease

## 2018-05-13 ENCOUNTER — Other Ambulatory Visit: Payer: Self-pay | Admitting: Physician Assistant

## 2018-05-13 DIAGNOSIS — E119 Type 2 diabetes mellitus without complications: Secondary | ICD-10-CM

## 2018-05-29 ENCOUNTER — Other Ambulatory Visit: Payer: Self-pay | Admitting: Physician Assistant

## 2018-05-29 DIAGNOSIS — I10 Essential (primary) hypertension: Secondary | ICD-10-CM

## 2018-05-29 DIAGNOSIS — F419 Anxiety disorder, unspecified: Secondary | ICD-10-CM

## 2018-05-29 MED ORDER — AMLODIPINE BESYLATE 5 MG PO TABS: 5.0000 mg | ORAL_TABLET | Freq: Every day | ORAL | 1 refills | Status: DC

## 2018-05-29 NOTE — Telephone Encounter (Signed)
Amlodipine refilled to [facility].  

## 2018-05-29 NOTE — Telephone Encounter (Signed)
[facility] faxed refill request for the following medications:  amLODipine (NORVASC) 5 MG tablet 90 day supply  Please advise. Thanks TNP

## 2018-06-12 ENCOUNTER — Encounter: Payer: Self-pay | Admitting: Physician Assistant

## 2018-06-12 ENCOUNTER — Ambulatory Visit (INDEPENDENT_AMBULATORY_CARE_PROVIDER_SITE_OTHER): Payer: BLUE CROSS/BLUE SHIELD | Admitting: Physician Assistant

## 2018-06-12 ENCOUNTER — Other Ambulatory Visit: Payer: Self-pay

## 2018-06-12 VITALS — BP 145/78 | HR 70 | Temp 98.1°F | Resp 16 | Wt 156.0 lb

## 2018-06-12 DIAGNOSIS — B078 Other viral warts: Secondary | ICD-10-CM

## 2018-06-12 DIAGNOSIS — E119 Type 2 diabetes mellitus without complications: Secondary | ICD-10-CM

## 2018-06-12 DIAGNOSIS — B079 Viral wart, unspecified: Secondary | ICD-10-CM

## 2018-06-12 LAB — POCT GLYCOSYLATED HEMOGLOBIN (HGB A1C)
EOS (ABSOLUTE): 108
Hemoglobin A1C: 5.4 % (ref 4.0–5.6)

## 2018-06-12 NOTE — Patient Instructions (Signed)
Diabetes Mellitus and Exercise Exercising regularly is important for your overall health, especially when you have diabetes (diabetes mellitus). Exercising is not only about losing weight. It has many other health benefits, such as increasing muscle strength and bone density and reducing body fat and stress. This leads to improved fitness, flexibility, and endurance, all of which result in better overall health. Exercise has additional benefits for people with diabetes, including:  Reducing appetite.  Helping to lower and control blood glucose.  Lowering blood pressure.  Helping to control amounts of fatty substances (lipids) in the blood, such as cholesterol and triglycerides.  Helping the body to respond better to insulin (improving insulin sensitivity).  Reducing how much insulin the body needs.  Decreasing the risk for heart disease by: ? Lowering cholesterol and triglyceride levels. ? Increasing the levels of good cholesterol. ? Lowering blood glucose levels. What is my activity plan? Your health care provider or certified diabetes educator can help you make a plan for the type and frequency of exercise (activity plan) that works for you. Make sure that you:  Do at least 150 minutes of moderate-intensity or vigorous-intensity exercise each week. This could be brisk walking, biking, or water aerobics. ? Do stretching and strength exercises, such as yoga or weightlifting, at least 2 times a week. ? Spread out your activity over at least 3 days of the week.  Get some form of physical activity every day. ? Do not go more than 2 days in a row without some kind of physical activity. ? Avoid being inactive for more than 30 minutes at a time. Take frequent breaks to walk or stretch.  Choose a type of exercise or activity that you enjoy, and set realistic goals.  Start slowly, and gradually increase the intensity of your exercise over time. What do I need to know about managing my  diabetes?   Check your blood glucose before and after exercising. ? If your blood glucose is 240 mg/dL (13.3 mmol/L) or higher before you exercise, check your urine for ketones. If you have ketones in your urine, do not exercise until your blood glucose returns to normal. ? If your blood glucose is 100 mg/dL (5.6 mmol/L) or lower, eat a snack containing 15-20 grams of carbohydrate. Check your blood glucose 15 minutes after the snack to make sure that your level is above 100 mg/dL (5.6 mmol/L) before you start your exercise.  Know the symptoms of low blood glucose (hypoglycemia) and how to treat it. Your risk for hypoglycemia increases during and after exercise. Common symptoms of hypoglycemia can include: ? Hunger. ? Anxiety. ? Sweating and feeling clammy. ? Confusion. ? Dizziness or feeling light-headed. ? Increased heart rate or palpitations. ? Blurry vision. ? Tingling or numbness around the mouth, lips, or tongue. ? Tremors or shakes. ? Irritability.  Keep a rapid-acting carbohydrate snack available before, during, and after exercise to help prevent or treat hypoglycemia.  Avoid injecting insulin into areas of the body that are going to be exercised. For example, avoid injecting insulin into: ? The arms, when playing tennis. ? The legs, when jogging.  Keep records of your exercise habits. Doing this can help you and your health care provider adjust your diabetes management plan as needed. Write down: ? Food that you eat before and after you exercise. ? Blood glucose levels before and after you exercise. ? The type and amount of exercise you have done. ? When your insulin is expected to peak, if you use   insulin. Avoid exercising at times when your insulin is peaking.  When you start a new exercise or activity, work with your health care provider to make sure the activity is safe for you, and to adjust your insulin, medicines, or food intake as needed.  Drink plenty of water while  you exercise to prevent dehydration or heat stroke. Drink enough fluid to keep your urine clear or pale yellow. Summary  Exercising regularly is important for your overall health, especially when you have diabetes (diabetes mellitus).  Exercising has many health benefits, such as increasing muscle strength and bone density and reducing body fat and stress.  Your health care provider or certified diabetes educator can help you make a plan for the type and frequency of exercise (activity plan) that works for you.  When you start a new exercise or activity, work with your health care provider to make sure the activity is safe for you, and to adjust your insulin, medicines, or food intake as needed. This information is not intended to replace advice given to you by your health care provider. Make sure you discuss any questions you have with your health care provider. Document Released: 04/17/2003 Document Revised: 08/05/2016 Document Reviewed: 07/07/2015 Elsevier Interactive Patient Education  2019 Elsevier Inc.  

## 2018-06-12 NOTE — Progress Notes (Addendum)
Patient: Sara Roy Female    DOB: Jun 28, 1960   58 y.o.   MRN: [id number] Visit Date: 06/12/2018  Today's Provider: Mar Daring, PA-C   Chief Complaint  Patient presents with  . Follow-up  . Diabetes   Subjective:     HPI    Diabetes Mellitus Type II, Follow-up:   Lab Results  Component Value Date   HGBA1C 5.9 (H) 12/12/2017   HGBA1C 10.0 (A) 09/07/2017   HGBA1C 13.3 (H) 08/03/2017   Last seen for diabetes 6 months ago.  Management since then includes; labs checked. Decreased metformin to 1033m once daily now.  She reports good compliance with treatment. She is not having side effects. none Current symptoms include none and have been unchanged. Home blood sugar records: fasting range: 85-95  Episodes of hypoglycemia? no   Current Insulin Regimen: n/a Most Recent Eye Exam: has eye exam scheduled 08/08/2018 Weight trend: stable Prior visit with dietician: no Current diet: in general, a "healthy" diet  , well balanced Current exercise: bicycling and walking ----------------------------------------------------------------    Allergies  Allergen Reactions  . Lisinopril     Rash   . Penicillins Hives  . Losartan Rash     Current Outpatient Medications:  .  albuterol (PROVENTIL HFA;VENTOLIN HFA) 108 (90 Base) MCG/ACT inhaler, Inhale 2 puffs into the lungs every 6 (six) hours as needed for wheezing or shortness of breath., Disp: 1 Inhaler, Rfl: 0 .  ALPRAZolam (XANAX) 0.25 MG tablet, TAKE 1 TABLET BY MOUTH THREE TIMES DAILY, Disp: 90 tablet, Rfl: 5 .  amLODipine (NORVASC) 5 MG tablet, Take 1 tablet (5 mg total) by mouth daily., Disp: 90 tablet, Rfl: 1 .  aspirin EC 81 MG tablet, Take 1 tablet (81 mg total) by mouth daily., Disp: 90 tablet, Rfl: 3 .  atorvastatin (LIPITOR) 40 MG tablet, TAKE 1 TABLET BY MOUTH EVERY DAY, Disp: 90 tablet, Rfl: 0 .  Blood Glucose Monitoring Suppl (ONETOUCH VERIO) w/Device KIT, 1 kit by Does not apply route daily before  breakfast., Disp: 1 kit, Rfl: 0 .  fluticasone furoate-vilanterol (BREO ELLIPTA) 200-25 MCG/INH AEPB, Inhale 1 puff into the lungs daily., Disp: 60 each, Rfl: 5 .  glucose blood (ONETOUCH VERIO) test strip, To check BS once daily, Disp: 100 each, Rfl: 12 .  hydrochlorothiazide (HYDRODIURIL) 25 MG tablet, TAKE 1 TABLET BY MOUTH EVERY DAY, Disp: 90 tablet, Rfl: 1 .  Lancets (ONETOUCH ULTRASOFT) lancets, To check BS once daily, Disp: 100 each, Rfl: 12 .  metFORMIN (GLUCOPHAGE) 1000 MG tablet, TAKE 1 TABLET(1000 MG) BY MOUTH TWICE DAILY WITH A MEAL (Patient taking differently: 1,000 mg daily with breakfast. ), Disp: 180 tablet, Rfl: 1 .  mometasone (NASONEX) 50 MCG/ACT nasal spray, Place 2 sprays into the nose daily., Disp: 17 g, Rfl: 12 .  nystatin (MYCOSTATIN) 100000 UNIT/ML suspension, Take 5 mLs (500,000 Units total) by mouth 4 (four) times daily., Disp: 60 mL, Rfl: 0 .  potassium chloride (K-DUR) 10 MEQ tablet, Take 1 tablet (10 mEq total) by mouth daily., Disp: 90 tablet, Rfl: 1  Review of Systems  Constitutional: Negative for appetite change, chills, fatigue and fever.  Respiratory: Negative for chest tightness and shortness of breath.   Cardiovascular: Negative for chest pain and palpitations.  Gastrointestinal: Negative for abdominal pain, nausea and vomiting.  Endocrine: Negative for polyphagia and polyuria.  Neurological: Negative for dizziness and weakness.    Social History   Tobacco Use  . Smoking status:  Current Every Day Smoker    Packs/day: 1.50    Years: 40.00    Pack years: 60.00    Types: Cigarettes  . Smokeless tobacco: Never Used  Substance Use Topics  . Alcohol use: No    Comment: Quit 6 years ago      Objective:   BP (!) 145/78 (BP Location: Left Arm, Patient Position: Sitting, Cuff Size: Large)   Pulse 70   Temp 98.1 F (36.7 C) (Oral)   Resp 16   Wt 156 lb (70.8 kg)   SpO2 96%   BMI 24.43 kg/m  Vitals:   06/12/18 0814  BP: (!) 145/78  Pulse: 70   Resp: 16  Temp: 98.1 F (36.7 C)  TempSrc: Oral  SpO2: 96%  Weight: 156 lb (70.8 kg)     Physical Exam Vitals signs reviewed.  Constitutional:      General: She is not in acute distress.    Appearance: Normal appearance. She is well-developed and normal weight. She is not ill-appearing or diaphoretic.  HENT:     Head: Normocephalic and atraumatic.  Eyes:     General: No scleral icterus. Neck:     Musculoskeletal: Normal range of motion and neck supple.     Thyroid: No thyromegaly.     Vascular: No JVD.     Trachea: No tracheal deviation.  Cardiovascular:     Rate and Rhythm: Normal rate and regular rhythm.     Heart sounds: Normal heart sounds. No murmur. No friction rub. No gallop.   Pulmonary:     Effort: Pulmonary effort is normal. No respiratory distress.     Breath sounds: Normal breath sounds. No wheezing or rales.  Musculoskeletal:     Right lower leg: No edema.     Left lower leg: No edema.  Lymphadenopathy:     Cervical: No cervical adenopathy.  Neurological:     Mental Status: She is alert.  Psychiatric:        Mood and Affect: Mood normal.        Behavior: Behavior normal.        Thought Content: Thought content normal.        Judgment: Judgment normal.         Assessment & Plan    1. Type 2 diabetes mellitus without complication, without long-term current use of insulin (HCC) A1c down to 5.4! She has done fantastic with lifestyle modifications of dieting and exercise. She has lost over 50 pounds since last June at diagnosis. She continues to do great with limiting carbs in diet. I will discontinue her metformin at this time due to lifestyle successes. I will see her back in 6 months for CPE.  - POCT glycosylated hemoglobin (Hb A1C)  Addend:  2. [facility] Forgot to document that patient had a small wart in the left jaw line she requested to have frozen off. Cryopen was used x 45 seconds for destruction of the wart. Expectant healing stages verbally  given.     Mar Daring, PA-C  [facility]

## 2018-06-25 ENCOUNTER — Other Ambulatory Visit: Payer: Self-pay | Admitting: Physician Assistant

## 2018-06-26 NOTE — Telephone Encounter (Signed)
Please review

## 2018-08-11 ENCOUNTER — Other Ambulatory Visit: Payer: Self-pay | Admitting: Physician Assistant

## 2018-08-11 DIAGNOSIS — I10 Essential (primary) hypertension: Secondary | ICD-10-CM

## 2018-08-19 ENCOUNTER — Other Ambulatory Visit: Payer: Self-pay | Admitting: Cardiovascular Disease

## 2018-08-24 ENCOUNTER — Ambulatory Visit: Payer: Self-pay | Admitting: Cardiovascular Disease

## 2018-08-24 ENCOUNTER — Encounter (HOSPITAL_COMMUNITY): Payer: Self-pay

## 2018-09-05 ENCOUNTER — Other Ambulatory Visit: Payer: Self-pay | Admitting: Physician Assistant

## 2018-09-05 DIAGNOSIS — E119 Type 2 diabetes mellitus without complications: Secondary | ICD-10-CM

## 2018-09-15 ENCOUNTER — Ambulatory Visit (INDEPENDENT_AMBULATORY_CARE_PROVIDER_SITE_OTHER): Payer: BC Managed Care – PPO

## 2018-09-15 ENCOUNTER — Other Ambulatory Visit: Payer: Self-pay | Admitting: Cardiovascular Disease

## 2018-09-15 ENCOUNTER — Other Ambulatory Visit: Payer: Self-pay

## 2018-09-15 DIAGNOSIS — I739 Peripheral vascular disease, unspecified: Secondary | ICD-10-CM | POA: Diagnosis not present

## 2018-09-22 ENCOUNTER — Ambulatory Visit: Payer: BC Managed Care – PPO | Admitting: Cardiovascular Disease

## 2018-09-22 ENCOUNTER — Encounter: Payer: Self-pay | Admitting: Cardiovascular Disease

## 2018-09-22 ENCOUNTER — Other Ambulatory Visit: Payer: Self-pay

## 2018-09-22 VITALS — BP 152/60 | HR 71 | Ht 67.0 in | Wt 148.0 lb

## 2018-09-22 DIAGNOSIS — E785 Hyperlipidemia, unspecified: Secondary | ICD-10-CM

## 2018-09-22 DIAGNOSIS — Z720 Tobacco use: Secondary | ICD-10-CM | POA: Diagnosis not present

## 2018-09-22 DIAGNOSIS — I739 Peripheral vascular disease, unspecified: Secondary | ICD-10-CM

## 2018-09-22 DIAGNOSIS — I10 Essential (primary) hypertension: Secondary | ICD-10-CM

## 2018-09-22 NOTE — Patient Instructions (Signed)
Medication Instructions:  Your physician recommends that you continue on your current medications as directed. Please refer to the Current Medication list given to you today.  If you need a refill on your cardiac medications before your next appointment, please call your pharmacy.   Lab work: None ordered If you have labs (blood work) drawn today and your tests are completely normal, you will receive your results only by: . MyChart Message (if you have MyChart) OR . A paper copy in the mail If you have any lab test that is abnormal or we need to change your treatment, we will call you to review the results.  Testing/Procedures: None ordered  Follow-Up: At [facility], you and your health needs are our priority.  As part of our continuing mission to provide you with exceptional heart care, we have created designated Provider Care Teams.  These Care Teams include your primary Cardiologist (physician) and Advanced Practice Providers (APPs -  Physician Assistants and Nurse Practitioners) who all work together to provide you with the care you need, when you need it. You will need a follow up appointment in 6 months.  Please call our office 2 months in advance to schedule this appointment.  You may see Dr. Arida  or one of the following Advanced Practice Providers on your designated Care Team:   Christopher Berge, NP Ryan Dunn, PA-C . Jacquelyn Visser, PA-C  Any Other Special Instructions Will Be Listed Below (If Applicable). N/A   

## 2018-09-22 NOTE — Progress Notes (Signed)
Cardiology Office Note   Date:  09/22/2018   ID:  Sara Roy, DOB 1960-06-06, MRN [id number]  PCP:  Mar Daring, PA-C  Cardiologist:   Kathlyn Sacramento, MD   Chief Complaint  Patient presents with  . Other    6 month follow up. patient denies chest pain and SOB. Meds reviewed verbally with patient.       History of Present Illness: Sara Roy is a 58 y.o. female who presents for  a follow up visit regarding peripheral arterial disease. She has no previous cardiac history. She has chronic medical conditions that include hypertension, hyperlipidemia and prolonged tobacco use. She smokes one half pack per day and has been doing so for more than 40 years. She is s/p bilateral kissing stent placement to both common iliac arteries into the distal aorta in September 2015 for severe claudication. She had an echocardiogram done in June 2016 for a heart murmur which showed normal LV systolic function with no significant valvular abnormalities. The aortic valve was not well-visualized.  She was diagnosed with type 2 diabetes in 2019 with blood sugar greater than 600.  She was started on medications but she worked on improving her diet and was able to lose 80 pounds.  She was taken off diabetes medications and most recent hemoglobin A1c was normal.  Unfortunately, she continues to smoke.  She denies any chest pain or worsening dyspnea.  No leg claudication. Recent vascular studies showed normal ABI bilaterally with stable moderate stenosis in the iliac artery stent with velocities still in the 300 range.   Past Medical History:  Diagnosis Date  . Allergy   . Anemia   . Anxiety   . Family history of early CAD   . Heart murmur   . HOH (hard of hearing)    reads lips wears hearing aids  . Hyperlipidemia   . Hypertension   . Peripheral vascular disease ([place])   . Substance abuse ([place])    Quit drinking alcohol 6 years ago  . Tobacco abuse     Past Surgical History:  Procedure  Laterality Date  . ABDOMINAL AORTAGRAM N/A 10/10/2013   Procedure: ABDOMINAL Maxcine Ham;  Surgeon: Wellington Hampshire, MD;  Location: [facility] CATH [facility];  Service: Cardiovascular;  Laterality: N/A;  . ABDOMINAL HYSTERECTOMY    . CHOLECYSTECTOMY    . COLONOSCOPY WITH PROPOFOL N/A 10/16/2015   Procedure: COLONOSCOPY WITH PROPOFOL;  Surgeon: Lucilla Lame, MD;  Location: [facility];  Service: Endoscopy;  Laterality: N/A;  . LOWER EXTREMITY ANGIOGRAM Bilateral 10/10/2013   ILIAC   BILATERAL   BY DR ARDA  . POLYPECTOMY  10/16/2015   Procedure: POLYPECTOMY;  Surgeon: Lucilla Lame, MD;  Location: [facility];  Service: Endoscopy;;  . SKIN CANCER EXCISION       Current Outpatient Medications  Medication Sig Dispense Refill  . albuterol (PROVENTIL HFA;VENTOLIN HFA) 108 (90 Base) MCG/ACT inhaler Inhale 2 puffs into the lungs every 6 (six) hours as needed for wheezing or shortness of breath. 1 Inhaler 0  . ALPRAZolam (XANAX) 0.25 MG tablet TAKE 1 TABLET BY MOUTH THREE TIMES DAILY 90 tablet 5  . amLODipine (NORVASC) 5 MG tablet Take 1 tablet (5 mg total) by mouth daily. 90 tablet 1  . aspirin EC 81 MG tablet Take 1 tablet (81 mg total) by mouth daily. 90 tablet 3  . atorvastatin (LIPITOR) 40 MG tablet TAKE 1 TABLET BY MOUTH EVERY DAY 90 tablet 3  . Blood  Glucose Monitoring Suppl (ONETOUCH VERIO) w/Device KIT 1 kit by Does not apply route daily before breakfast. 1 kit 0  . fluticasone furoate-vilanterol (BREO ELLIPTA) 200-25 MCG/INH AEPB Inhale 1 puff into the lungs daily. 60 each 5  . hydrochlorothiazide (HYDRODIURIL) 25 MG tablet TAKE 1 TABLET BY MOUTH EVERY DAY 90 tablet 1  . Lancets (ONETOUCH DELICA PLUS [id number]) MISC USE TO CHECK BLOOD SUGAR ONCE DAILY 100 each 12  . mometasone (NASONEX) 50 MCG/ACT nasal spray Place 2 sprays into the nose daily. 17 g 12  . nystatin (MYCOSTATIN) 100000 UNIT/ML suspension Take 5 mLs (500,000 Units total) by mouth 4 (four) times daily. 60 mL 0  . ONETOUCH VERIO test  strip TEST BLOOD SUGAR ONCE DAILY 100 strip 12  . potassium chloride (K-DUR) 10 MEQ tablet TAKE 1 TABLET BY MOUTH DAILY 90 tablet 1   No current facility-administered medications for this visit.     Allergies:   Lisinopril, Penicillins, and Losartan    Social History:  The patient  reports that she has been smoking cigarettes. She has a 60.00 pack-year smoking history. She has never used smokeless tobacco. She reports current drug use. Drug: Marijuana. She reports that she does not drink alcohol.   Family History:  The patient's family history includes Cancer in her mother; Heart disease in her father; Hemachromatosis in an other family member; Hodgkin's lymphoma in her cousin.    ROS:  Please see the history of present illness.   Otherwise, review of systems are positive for none.   All other systems are reviewed and negative.    PHYSICAL EXAM: VS:  BP (!) 152/60 (BP Location: Left Arm, Patient Position: Sitting, Cuff Size: Normal)   Pulse 71   Ht '5\' 7"'$  (1.702 m)   Wt 148 lb (67.1 kg)   BMI 23.18 kg/m  , BMI Body mass index is 23.18 kg/m. GEN: Well nourished, well developed, in no acute distress  HEENT: normal  Neck: no JVD, carotid bruits, or masses Cardiac: RRR; no rubs, or gallops,no edema . 2/6 crescendo decrescendo systolic murmur in the aortic area which is early peaking. Respiratory:  clear to auscultation bilaterally, normal work of breathing GI: soft, nontender, nondistended, + BS MS: no deformity or atrophy  Skin: warm and dry, no rash Neuro:  Strength and sensation are intact Psych: euthymic mood, full affect    EKG:  EKG is  ordered today. EKG showed sinus rhythm with a PVC.  Recent Labs: 12/12/2017: BUN 22; Creatinine, Ser 0.67; Hemoglobin 15.4; Platelets 401; Potassium 3.9; Sodium 141    Lipid Panel    Component Value Date/Time   CHOL 132 03/10/2015 1128   CHOL 139 10/26/2013 0757   TRIG 187.0 (H) 03/10/2015 1128   HDL 36.00 (L) 03/10/2015 1128   HDL  41 10/26/2013 0757   CHOLHDL 4 03/10/2015 1128   VLDL 37.4 03/10/2015 1128   LDLCALC 58 03/10/2015 1128   LDLCALC 76 10/26/2013 0757   LDLDIRECT 162.7 08/18/2011 1007      Wt Readings from Last 3 Encounters:  09/22/18 148 lb (67.1 kg)  06/12/18 156 lb (70.8 kg)  12/12/17 183 lb 12.8 oz (83.4 kg)        ASSESSMENT AND PLAN:  1.  Peripheral arterial disease: Status post bilateral common iliac artery stent placement .  She denies claudication at the present time.  Recent studies showed normal ABI with stable restenosis.    2. Essential hypertension:  Blood pressure is elevated today but that is  unusual for her as her blood pressure has been reasonably controlled lately.  Continue to monitor for now continue same medications with amlodipine and hydrochlorothiazide.  3. Hyperlipidemia: Continue treatment with atorvastatin 40 mg once daily.  Most recent LDL was 58 but that was in 2017.  Recommend a follow-up lipid profile with her next labs.  4. Tobacco use: I again discussed with her the importance of smoking cessation.    Disposition:   FU with me in  6 months  Signed,  Kathlyn Sacramento, MD  09/22/2018 4:02 PM    [facility]

## 2018-10-09 ENCOUNTER — Other Ambulatory Visit: Payer: Self-pay | Admitting: Physician Assistant

## 2018-10-09 DIAGNOSIS — F419 Anxiety disorder, unspecified: Secondary | ICD-10-CM

## 2018-10-09 MED ORDER — ALPRAZOLAM 0.25 MG PO TABS: 0.2500 mg | ORAL_TABLET | Freq: Three times a day (TID) | ORAL | 5 refills | Status: DC

## 2018-10-09 NOTE — Telephone Encounter (Signed)
[facility] faxed refill request for the following medications:  ALPRAZolam (XANAX) 0.25 MG tablet  Please advise.  

## 2018-10-19 DIAGNOSIS — L538 Other specified erythematous conditions: Secondary | ICD-10-CM | POA: Diagnosis not present

## 2018-10-19 DIAGNOSIS — D171 Benign lipomatous neoplasm of skin and subcutaneous tissue of trunk: Secondary | ICD-10-CM | POA: Diagnosis not present

## 2018-10-19 DIAGNOSIS — D485 Neoplasm of uncertain behavior of skin: Secondary | ICD-10-CM | POA: Diagnosis not present

## 2018-10-19 DIAGNOSIS — D225 Melanocytic nevi of trunk: Secondary | ICD-10-CM | POA: Diagnosis not present

## 2018-11-28 DIAGNOSIS — H524 Presbyopia: Secondary | ICD-10-CM | POA: Diagnosis not present

## 2018-11-28 DIAGNOSIS — H5202 Hypermetropia, left eye: Secondary | ICD-10-CM | POA: Diagnosis not present

## 2018-11-28 DIAGNOSIS — H52222 Regular astigmatism, left eye: Secondary | ICD-10-CM | POA: Diagnosis not present

## 2018-11-28 DIAGNOSIS — E119 Type 2 diabetes mellitus without complications: Secondary | ICD-10-CM | POA: Diagnosis not present

## 2018-11-28 LAB — HM DIABETES EYE EXAM

## 2018-12-07 ENCOUNTER — Other Ambulatory Visit: Payer: Self-pay | Admitting: Physician Assistant

## 2018-12-07 DIAGNOSIS — I10 Essential (primary) hypertension: Secondary | ICD-10-CM

## 2018-12-13 ENCOUNTER — Encounter: Payer: Self-pay | Admitting: Physician Assistant

## 2018-12-18 ENCOUNTER — Encounter: Payer: Self-pay | Admitting: Physician Assistant

## 2018-12-19 NOTE — Progress Notes (Signed)
Patient: Sara Roy, Female    DOB: January 26, 1961, 58 y.o.   MRN: [id number] Visit Date: 12/20/2018  Today's Provider: Mar Daring, PA-C   Chief Complaint  Patient presents with   Annual Exam   Subjective:     Annual physical exam [place] is a 58 y.o. female who presents today for health maintenance and complete physical. She feels well. She reports exercising none. She reports she is sleeping fairly well. -----------------------------------------------------------------   Review of Systems  Constitutional: Negative for appetite change, chills, fatigue and fever.  HENT: Positive for hearing loss (chronic).   Eyes: Negative.   Respiratory: Negative for chest tightness and shortness of breath.   Cardiovascular: Negative for chest pain and palpitations.  Gastrointestinal: Negative for abdominal pain, nausea and vomiting.  Endocrine: Negative.   Genitourinary: Negative.   Musculoskeletal: Negative.   Skin: Negative.   Allergic/Immunologic: Negative.   Neurological: Negative for dizziness and weakness.  Hematological: Negative.   Psychiatric/Behavioral: Negative.     Social History      She  reports that she has been smoking cigarettes. She has a 60.00 pack-year smoking history. She has never used smokeless tobacco. She reports current drug use. Drug: Marijuana. She reports that she does not drink alcohol.       Social History   Socioeconomic History   Marital status: Married    Spouse name: Not on file   Number of children: Not on file   Years of education: Not on file   Highest education level: Not on file  Occupational History   Not on file  Social Needs   Financial resource strain: Not on file   Food insecurity    Worry: Not on file    Inability: Not on file   Transportation needs    Medical: Not on file    Non-medical: Not on file  Tobacco Use   Smoking status: Current Every Day Smoker    Packs/day: 1.50    Years: 40.00   Pack years: 60.00    Types: Cigarettes   Smokeless tobacco: Never Used  Substance and Sexual Activity   Alcohol use: No    Comment: Quit 6 years ago   Drug use: Yes    Types: Marijuana   Sexual activity: Not on file  Lifestyle   Physical activity    Days per week: Not on file    Minutes per session: Not on file   Stress: Not on file  Relationships   Social connections    Talks on phone: Not on file    Gets together: Not on file    Attends religious service: Not on file    Active member of club or organization: Not on file    Attends meetings of clubs or organizations: Not on file    Relationship status: Not on file  Other Topics Concern   Not on file  Social History Narrative   Not on file    Past Medical History:  Diagnosis Date   Allergy    Anemia    Anxiety    Family history of early CAD    Heart murmur    HOH (hard of hearing)    reads lips wears hearing aids   Hyperlipidemia    Hypertension    Peripheral vascular disease ([employer])    Substance abuse ([place])    Quit drinking alcohol 6 years ago   Tobacco abuse      Patient Active Problem  List   Diagnosis Date Noted   Type 2 diabetes mellitus without complication, without long-term current use of insulin ([place]) 08/03/2017   Sebaceous cyst 06/16/2016   Angiodysplasia of intestine with hemorrhage    Rectal polyp    Anemia 10/02/2015   Vitamin D deficiency 10/02/2015   Family history of hemochromatosis 03/10/2015   Cardiac murmur 07/25/2014   PAD (peripheral artery disease) ([place]) 10/10/2013   Peripheral arterial disease ([place]) 09/14/2013   Shortness of breath 09/14/2013   Tobacco use 09/14/2013   Hyperlipidemia    Leukocytosis 08/18/2011   Elevated hemoglobin (HCC) 08/18/2011   Anxiety    Hypertension     Past Surgical History:  Procedure Laterality Date   ABDOMINAL AORTAGRAM N/A 10/10/2013   Procedure: ABDOMINAL Maxcine Ham;  Surgeon: Wellington Hampshire, MD;  Location: [facility]  CATH LAB;  Service: Cardiovascular;  Laterality: N/A;   ABDOMINAL HYSTERECTOMY     CHOLECYSTECTOMY     COLONOSCOPY WITH PROPOFOL N/A 10/16/2015   Procedure: COLONOSCOPY WITH PROPOFOL;  Surgeon: Lucilla Lame, MD;  Location: [facility];  Service: Endoscopy;  Laterality: N/A;   LOWER EXTREMITY ANGIOGRAM Bilateral 10/10/2013   ILIAC   BILATERAL   BY DR ARDA   POLYPECTOMY  10/16/2015   Procedure: POLYPECTOMY;  Surgeon: Lucilla Lame, MD;  Location: [facility];  Service: Endoscopy;;   SKIN CANCER EXCISION      Family History        Family Status  Relation Name Status   Mother  Deceased       vulvuar cancer   Father  Deceased at age 25       MI   [place]  (Not Specified)   Other some siblings Alive        Her family history includes Cancer in her mother; Heart disease in her father; Hemachromatosis in an other family member; Hodgkin's lymphoma in her cousin.      Allergies  Allergen Reactions   Lisinopril     Rash    Penicillins Hives   Losartan Rash     Current Outpatient Medications:    albuterol (PROVENTIL HFA;VENTOLIN HFA) 108 (90 Base) MCG/ACT inhaler, Inhale 2 puffs into the lungs every 6 (six) hours as needed for wheezing or shortness of breath., Disp: 1 Inhaler, Rfl: 0   ALPRAZolam (XANAX) 0.25 MG tablet, Take 1 tablet (0.25 mg total) by mouth 3 (three) times daily., Disp: 90 tablet, Rfl: 5   amLODipine (NORVASC) 5 MG tablet, TAKE 1 TABLET(5 MG) BY MOUTH DAILY, Disp: 90 tablet, Rfl: 1   aspirin EC 81 MG tablet, Take 1 tablet (81 mg total) by mouth daily., Disp: 90 tablet, Rfl: 3   atorvastatin (LIPITOR) 40 MG tablet, TAKE 1 TABLET BY MOUTH EVERY DAY, Disp: 90 tablet, Rfl: 3   Blood Glucose Monitoring Suppl (ONETOUCH VERIO) w/Device KIT, 1 kit by Does not apply route daily before breakfast., Disp: 1 kit, Rfl: 0   fluticasone furoate-vilanterol (BREO ELLIPTA) 200-25 MCG/INH AEPB, Inhale 1 puff into the lungs daily., Disp: 60 each, Rfl: 5    hydrochlorothiazide (HYDRODIURIL) 25 MG tablet, TAKE 1 TABLET BY MOUTH EVERY DAY, Disp: 90 tablet, Rfl: 1   Lancets (ONETOUCH DELICA PLUS [id number]) MISC, USE TO CHECK BLOOD SUGAR ONCE DAILY, Disp: 100 each, Rfl: 12   mometasone (NASONEX) 50 MCG/ACT nasal spray, Place 2 sprays into the nose daily., Disp: 17 g, Rfl: 12   nystatin (MYCOSTATIN) 100000 UNIT/ML suspension, Take 5 mLs (500,000 Units total) by mouth 4 (four) times  daily., Disp: 60 mL, Rfl: 0   ONETOUCH VERIO test strip, TEST BLOOD SUGAR ONCE DAILY, Disp: 100 strip, Rfl: 12   potassium chloride (K-DUR) 10 MEQ tablet, TAKE 1 TABLET BY MOUTH DAILY, Disp: 90 tablet, Rfl: 1   Patient Care Team: Rubye Beach as PCP - General (Family Medicine)    Objective:    Vitals: BP (!) 152/75 (BP Location: Left Arm, Patient Position: Sitting, Cuff Size: Large)    Pulse 69    Temp (!) 97.3 F (36.3 C) (Temporal)    Resp 16    Ht '5\' 7"'$  (1.702 m)    Wt 149 lb 3.2 oz (67.7 kg)    BMI 23.37 kg/m    Vitals:   12/20/18 1101  BP: (!) 152/75  Pulse: 69  Resp: 16  Temp: (!) 97.3 F (36.3 C)  TempSrc: Temporal  Weight: 149 lb 3.2 oz (67.7 kg)  Height: '5\' 7"'$  (1.702 m)     Physical Exam Vitals signs reviewed.  Constitutional:      General: She is not in acute distress.    Appearance: Normal appearance. She is well-developed and normal weight. She is not ill-appearing or diaphoretic.  HENT:     Head: Normocephalic and atraumatic.     Right Ear: Tympanic membrane, ear canal and external ear normal.     Left Ear: Tympanic membrane, ear canal and external ear normal.     Nose: Nose normal.     Mouth/Throat:     Mouth: Mucous membranes are moist.     Pharynx: Oropharynx is clear. No oropharyngeal exudate.  Eyes:     General: No scleral icterus.       Right eye: No discharge.        Left eye: No discharge.     Extraocular Movements: Extraocular movements intact.     Conjunctiva/sclera: Conjunctivae normal.     Pupils: Pupils  are equal, round, and reactive to light.  Neck:     Musculoskeletal: Normal range of motion and neck supple.     Thyroid: No thyromegaly.     Vascular: No carotid bruit or JVD.     Trachea: No tracheal deviation.  Cardiovascular:     Rate and Rhythm: Normal rate and regular rhythm.     Pulses: Normal pulses.     Heart sounds: Normal heart sounds. No murmur. No friction rub. No gallop.   Pulmonary:     Effort: Pulmonary effort is normal. No respiratory distress.     Breath sounds: Normal breath sounds. No wheezing or rales.  Chest:     Chest wall: No tenderness.  Abdominal:     General: Bowel sounds are normal. There is no distension.     Palpations: Abdomen is soft. There is no mass.     Tenderness: There is no abdominal tenderness. There is no guarding or rebound.  Musculoskeletal: Normal range of motion.        General: No tenderness.     Right lower leg: No edema.     Left lower leg: No edema.  Lymphadenopathy:     Cervical: No cervical adenopathy.  Skin:    General: Skin is warm and dry.     Capillary Refill: Capillary refill takes less than 2 seconds.     Findings: No rash.  Neurological:     General: No focal deficit present.     Mental Status: She is alert and oriented to person, place, and time. Mental status is at baseline.  Psychiatric:        Mood and Affect: Mood normal.        Behavior: Behavior normal.        Thought Content: Thought content normal.        Judgment: Judgment normal.      Depression Screen PHQ 2/9 Scores 12/20/2018 06/12/2018 05/10/2017  PHQ - 2 Score 0 0 3  PHQ- 9 Score - 0 9       Assessment & Plan:     Routine Health Maintenance and Physical Exam  Exercise Activities and Dietary recommendations Goals   None      There is no immunization history on file for this patient.  Health Maintenance  Topic Date Due   URINE MICROALBUMIN  08/14/1968   FOOT EXAM  12/13/2018   HEMOGLOBIN A1C  12/13/2018   INFLUENZA VACCINE   05/09/2019 (Originally 09/09/2018)   MAMMOGRAM  12/20/2019 (Originally 08/14/2008)   PNEUMOCOCCAL POLYSACCHARIDE VACCINE AGE 54-64 HIGH RISK  12/20/2019 (Originally 08/14/1960)   TETANUS/TDAP  12/20/2019 (Originally 08/14/1977)   OPHTHALMOLOGY EXAM  11/28/2019   COLONOSCOPY  10/15/2025   Hepatitis C Screening  Completed   HIV Screening  Completed     Discussed health benefits of physical activity, and encouraged her to engage in regular exercise appropriate for her age and condition.    1. Encounter for annual physical exam Normal physical exam today. Will check labs as below and f/u pending lab results. If labs are stable and WNL she will not need to have these rechecked for one year at her next annual physical exam. She is to call the office in the meantime if she has any acute issue, questions or concerns. - CBC w/Diff - Comprehensive Metabolic Panel (CMET) - TSH  2. Essential hypertension Elevated. Patient declines any medication changes and states she feels it is elevated due to stress (recently got a promotion at work, training for her new position as well as training 2 new employees). Continue HCTZ '25mg'$ . Discussed symptoms to monitor for.   3. Type 2 diabetes mellitus without complication, without long-term current use of insulin (HCC) Stable. Diet controlled. Will check labs as below and f/u pending results.;l - HgB A1c  4. Hyperlipidemia, unspecified hyperlipidemia type Stable. Continue Atorvastatin '40mg'$ . Will check labs as below and f/u pending results. - Lipid Profile  5. Acute bronchitis with COPD ([place]) Stable. Uses Albuterol prn. Continue Breo daily.   6. Elevated hemoglobin (HCC) Will check CBC and f/u pending results. Most likely secondary to smoking.   --------------------------------------------------------------------    Mar Daring, PA-C  [facility]

## 2018-12-20 ENCOUNTER — Ambulatory Visit (INDEPENDENT_AMBULATORY_CARE_PROVIDER_SITE_OTHER): Payer: BC Managed Care – PPO | Admitting: Physician Assistant

## 2018-12-20 ENCOUNTER — Other Ambulatory Visit: Payer: Self-pay

## 2018-12-20 ENCOUNTER — Encounter: Payer: Self-pay | Admitting: Physician Assistant

## 2018-12-20 VITALS — BP 152/75 | HR 69 | Temp 97.3°F | Resp 16 | Ht 67.0 in | Wt 149.2 lb

## 2018-12-20 DIAGNOSIS — Z0000 Encounter for general adult medical examination without abnormal findings: Secondary | ICD-10-CM

## 2018-12-20 DIAGNOSIS — I10 Essential (primary) hypertension: Secondary | ICD-10-CM | POA: Diagnosis not present

## 2018-12-20 DIAGNOSIS — J209 Acute bronchitis, unspecified: Secondary | ICD-10-CM | POA: Insufficient documentation

## 2018-12-20 DIAGNOSIS — E785 Hyperlipidemia, unspecified: Secondary | ICD-10-CM

## 2018-12-20 DIAGNOSIS — J440 Chronic obstructive pulmonary disease with acute lower respiratory infection: Secondary | ICD-10-CM

## 2018-12-20 DIAGNOSIS — D582 Other hemoglobinopathies: Secondary | ICD-10-CM

## 2018-12-20 DIAGNOSIS — E119 Type 2 diabetes mellitus without complications: Secondary | ICD-10-CM | POA: Diagnosis not present

## 2018-12-20 NOTE — Patient Instructions (Signed)

## 2018-12-21 ENCOUNTER — Telehealth: Payer: Self-pay

## 2018-12-21 LAB — CBC WITH DIFFERENTIAL/PLATELET
Basophils Absolute: 0.1 10*3/uL (ref 0.0–0.2)
Basos: 1 %
EOS (ABSOLUTE): 0.3 10*3/uL (ref 0.0–0.4)
Eos: 2 %
Hematocrit: 46.3 % (ref 34.0–46.6)
Hemoglobin: 15.6 g/dL (ref 11.1–15.9)
Immature Grans (Abs): 0.1 10*3/uL (ref 0.0–0.1)
Immature Granulocytes: 1 %
Lymphocytes Absolute: 3.8 10*3/uL — ABNORMAL HIGH (ref 0.7–3.1)
Lymphs: 30 %
MCH: 29.8 pg (ref 26.6–33.0)
MCHC: 33.7 g/dL (ref 31.5–35.7)
MCV: 89 fL (ref 79–97)
Monocytes Absolute: 0.7 10*3/uL (ref 0.1–0.9)
Monocytes: 6 %
Neutrophils Absolute: 7.7 10*3/uL — ABNORMAL HIGH (ref 1.4–7.0)
Neutrophils: 60 %
Platelets: 373 10*3/uL (ref 150–450)
RBC: 5.23 x10E6/uL (ref 3.77–5.28)
RDW: 13.4 % (ref 11.7–15.4)
WBC: 12.6 10*3/uL — ABNORMAL HIGH (ref 3.4–10.8)

## 2018-12-21 LAB — LIPID PANEL
Chol/HDL Ratio: 2.5 ratio (ref 0.0–4.4)
Cholesterol, Total: 130 mg/dL (ref 100–199)
HDL: 53 mg/dL (ref >39)
LDL Chol Calc (NIH): 61 mg/dL (ref 0–99)
Triglycerides: 79 mg/dL (ref 0–149)
VLDL Cholesterol Cal: 16 mg/dL (ref 5–40)

## 2018-12-21 LAB — TSH: TSH: 0.917 u[IU]/mL (ref 0.450–4.500)

## 2018-12-21 LAB — COMPREHENSIVE METABOLIC PANEL
ALT: 21 IU/L (ref 0–32)
AST: 22 IU/L (ref 0–40)
Albumin/Globulin Ratio: 1.9 (ref 1.2–2.2)
Albumin: 4.4 g/dL (ref 3.8–4.9)
Alkaline Phosphatase: 76 IU/L (ref 39–117)
BUN/Creatinine Ratio: 37 — ABNORMAL HIGH (ref 12–28)
BUN: 24 mg/dL (ref 8–27)
Bilirubin Total: 0.4 mg/dL (ref 0.0–1.2)
CO2: 23 mmol/L (ref 20–29)
Calcium: 9.8 mg/dL (ref 8.7–10.3)
Chloride: 103 mmol/L (ref 96–106)
Creatinine, Ser: 0.65 mg/dL (ref 0.57–1.00)
GFR calc Af Amer: 112 mL/min/{1.73_m2} (ref >59)
GFR calc non Af Amer: 97 mL/min/{1.73_m2} (ref >59)
Globulin, Total: 2.3 g/dL (ref 1.5–4.5)
Glucose: 81 mg/dL (ref 65–99)
Potassium: 4.1 mmol/L (ref 3.5–5.2)
Sodium: 140 mmol/L (ref 134–144)
Total Protein: 6.7 g/dL (ref 6.0–8.5)

## 2018-12-21 LAB — HEMOGLOBIN A1C
Est. average glucose Bld gHb Est-mCnc: 111 mg/dL
Hgb A1c MFr Bld: 5.5 % (ref 4.8–5.6)

## 2018-12-21 NOTE — Telephone Encounter (Signed)
Pt advised.   Thanks,   -Laura  

## 2018-12-21 NOTE — Telephone Encounter (Signed)
-----   Message from Mar Daring, PA-C sent at 12/21/2018  1:20 PM EST ----- Blood count is normal and stable. Elevated WBC count is secondary to smoking most likely. Kidney and liver function are normal Sodium, potassium, and calcium are normal. A1c/Sugar is normal. A1c down to 5.5. Thyroid is normal. Cholesterol is normal.

## 2018-12-28 DIAGNOSIS — Z20828 Contact with and (suspected) exposure to other viral communicable diseases: Secondary | ICD-10-CM | POA: Diagnosis not present

## 2019-01-01 ENCOUNTER — Other Ambulatory Visit: Payer: Self-pay | Admitting: Cardiovascular Disease

## 2019-01-01 DIAGNOSIS — I739 Peripheral vascular disease, unspecified: Secondary | ICD-10-CM

## 2019-01-05 ENCOUNTER — Other Ambulatory Visit: Payer: Self-pay | Admitting: Physician Assistant

## 2019-01-05 DIAGNOSIS — H6981 Other specified disorders of Eustachian tube, right ear: Secondary | ICD-10-CM

## 2019-01-06 NOTE — Telephone Encounter (Signed)
Requested medication (s) are due for refill today: no  Requested medication (s) are on the active medication list: no  Last refill:  05/14/2018  Future visit scheduled: no  Notes to clinic: medication was discontinued    Requested Prescriptions  Pending Prescriptions Disp Refills   fluticasone (FLONASE) 50 MCG/ACT nasal spray [Pharmacy Med Name: FLUTICASONE 50MCG NASAL SP (120) RX] 16 g 6    Sig: SHAKE LIQUID AND USE 2 SPRAYS IN EACH NOSTRIL DAILY     Ear, Nose, and Throat: Nasal Preparations - Corticosteroids Passed - 01/05/2019  4:26 PM      Passed - Valid encounter within last 12 months    Recent Outpatient Visits          2 weeks ago Type 2 diabetes mellitus without complication, without long-term current use of insulin [facility])   [facility] [place], [place], PA-C   6 months ago Type 2 diabetes mellitus without complication, without long-term current use of insulin [facility])   [facility] [place], [place], [place]   1 year ago Type 2 diabetes mellitus without complication, without long-term current use of insulin [facility])   [place], [place], [place]   1 year ago Type 2 diabetes mellitus without complication, without long-term current use of insulin [facility])   [place], [place], [place]   1 year ago Type 2 diabetes mellitus without complication, without long-term current use of insulin [facility])   [facility] [place], [place], [place]

## 2019-01-12 MED ORDER — FLUTICASONE PROPIONATE 50 MCG/ACT NA SUSP: 2.0000 | Freq: Every day | NASAL | 3 refills | Status: AC

## 2019-01-12 NOTE — Telephone Encounter (Signed)
Patient requesting prescription for Flonase nasal spray. Please advise. This medication was discontinued and is not on her active medication list.

## 2019-01-12 NOTE — Telephone Encounter (Signed)
Pt called saying she contacted the pharmacy regarding her nasal spray and they told her to call the office.  Pt's CB# [phone]

## 2019-01-12 NOTE — Addendum Note (Signed)
Addended by: Mar Daring on: 01/12/2019 12:55 PM   Modules accepted: Orders

## 2019-01-12 NOTE — Telephone Encounter (Signed)
Refilled for her 

## 2019-01-24 ENCOUNTER — Telehealth: Payer: Self-pay | Admitting: Physician Assistant

## 2019-01-24 DIAGNOSIS — J418 Mixed simple and mucopurulent chronic bronchitis: Secondary | ICD-10-CM

## 2019-01-24 MED ORDER — ALBUTEROL SULFATE HFA 108 (90 BASE) MCG/ACT IN AERS: 2.0000 | INHALATION_SPRAY | Freq: Four times a day (QID) | RESPIRATORY_TRACT | 4 refills | Status: DC | PRN

## 2019-01-24 NOTE — Telephone Encounter (Signed)
From [facility] 

## 2019-01-24 NOTE — Telephone Encounter (Signed)
refilled 

## 2019-01-24 NOTE — Telephone Encounter (Signed)
  Patient requesting albuterol (PROVENTIL HFA;VENTOLIN HFA) 108 (90 Base) MCG/ACT inhaler informed patient please allow 48 to 72 hour turn around time    [place], [place]

## 2019-02-21 ENCOUNTER — Other Ambulatory Visit: Payer: Self-pay | Admitting: Physician Assistant

## 2019-02-21 DIAGNOSIS — I10 Essential (primary) hypertension: Secondary | ICD-10-CM

## 2019-03-22 ENCOUNTER — Encounter: Payer: Self-pay | Admitting: Cardiovascular Disease

## 2019-03-22 ENCOUNTER — Other Ambulatory Visit: Payer: Self-pay

## 2019-03-22 ENCOUNTER — Ambulatory Visit: Payer: BC Managed Care – PPO | Admitting: Cardiovascular Disease

## 2019-03-22 VITALS — BP 120/60 | HR 67 | Ht 67.0 in | Wt 154.5 lb

## 2019-03-22 DIAGNOSIS — I10 Essential (primary) hypertension: Secondary | ICD-10-CM | POA: Diagnosis not present

## 2019-03-22 DIAGNOSIS — Z720 Tobacco use: Secondary | ICD-10-CM | POA: Diagnosis not present

## 2019-03-22 DIAGNOSIS — I739 Peripheral vascular disease, unspecified: Secondary | ICD-10-CM | POA: Diagnosis not present

## 2019-03-22 DIAGNOSIS — E785 Hyperlipidemia, unspecified: Secondary | ICD-10-CM

## 2019-03-22 NOTE — Progress Notes (Signed)
Cardiology Office Note   Date:  03/22/2019   ID:  Sara Roy, DOB Oct 25, 1960, MRN [id number]  PCP:  Mar Daring, PA-C  Cardiologist:   Kathlyn Sacramento, MD   Chief Complaint  Patient presents with  . other    6 month follow up. Meds reviewed by the pt. verbally. "doing well."       History of Present Illness: Sara Roy is a 59 y.o. female who presents for  a follow up visit regarding peripheral arterial disease. She has no previous cardiac history. She has chronic medical conditions that include hypertension, hyperlipidemia and prolonged tobacco use. She smokes one half pack per day and has been doing so for more than 40 years. She is s/p bilateral kissing stent placement to both common iliac arteries into the distal aorta in September 2015 for severe claudication. She had an echocardiogram done in June 2016 for a heart murmur which showed normal LV systolic function with no significant valvular abnormalities. The aortic valve was not well-visualized.  She was diagnosed with type 2 diabetes in 2019 with blood sugar greater than 600.  She was started on medications but she worked on improving her diet and was able to lose 80 pounds.  She was taken off diabetes medications and most recent hemoglobin A1c was normal.   Fortunately, she continues to smoke.  She denies any chest pain or shortness of breath.  No significant leg claudication.   Past Medical History:  Diagnosis Date  . Allergy   . Anemia   . Anxiety   . Family history of early CAD   . Heart murmur   . HOH (hard of hearing)    reads lips wears hearing aids  . Hyperlipidemia   . Hypertension   . Peripheral vascular disease ([place])   . Substance abuse ([place])    Quit drinking alcohol 6 years ago  . Tobacco abuse     Past Surgical History:  Procedure Laterality Date  . ABDOMINAL AORTAGRAM N/A 10/10/2013   Procedure: ABDOMINAL Maxcine Ham;  Surgeon: Wellington Hampshire, MD;  Location: [facility] CATH [facility];  Service:  Cardiovascular;  Laterality: N/A;  . ABDOMINAL HYSTERECTOMY    . CHOLECYSTECTOMY    . COLONOSCOPY WITH PROPOFOL N/A 10/16/2015   Procedure: COLONOSCOPY WITH PROPOFOL;  Surgeon: Lucilla Lame, MD;  Location: [facility];  Service: Endoscopy;  Laterality: N/A;  . LOWER EXTREMITY ANGIOGRAM Bilateral 10/10/2013   ILIAC   BILATERAL   BY DR ARDA  . POLYPECTOMY  10/16/2015   Procedure: POLYPECTOMY;  Surgeon: Lucilla Lame, MD;  Location: [facility];  Service: Endoscopy;;  . SKIN CANCER EXCISION       Current Outpatient Medications  Medication Sig Dispense Refill  . albuterol (VENTOLIN HFA) 108 (90 Base) MCG/ACT inhaler Inhale 2 puffs into the lungs every 6 (six) hours as needed for wheezing or shortness of breath. 18 g 4  . ALPRAZolam (XANAX) 0.25 MG tablet Take 1 tablet (0.25 mg total) by mouth 3 (three) times daily. 90 tablet 5  . amLODipine (NORVASC) 5 MG tablet TAKE 1 TABLET(5 MG) BY MOUTH DAILY 90 tablet 1  . aspirin EC 81 MG tablet Take 1 tablet (81 mg total) by mouth daily. 90 tablet 3  . atorvastatin (LIPITOR) 40 MG tablet TAKE 1 TABLET BY MOUTH EVERY DAY 90 tablet 3  . Blood Glucose Monitoring Suppl (ONETOUCH VERIO) w/Device KIT 1 kit by Does not apply route daily before breakfast. 1 kit 0  .  fluticasone (FLONASE) 50 MCG/ACT nasal spray Place 2 sprays into both nostrils daily. 48 g 3  . fluticasone furoate-vilanterol (BREO ELLIPTA) 200-25 MCG/INH AEPB Inhale 1 puff into the lungs daily. 60 each 5  . hydrochlorothiazide (HYDRODIURIL) 25 MG tablet TAKE 1 TABLET BY MOUTH EVERY DAY 90 tablet 1  . Lancets (ONETOUCH DELICA PLUS [id number]) MISC USE TO CHECK BLOOD SUGAR ONCE DAILY 100 each 12  . mometasone (NASONEX) 50 MCG/ACT nasal spray Place 2 sprays into the nose daily. 17 g 12  . nystatin (MYCOSTATIN) 100000 UNIT/ML suspension Take 5 mLs (500,000 Units total) by mouth 4 (four) times daily. 60 mL 0  . ONETOUCH VERIO test strip TEST BLOOD SUGAR ONCE DAILY 100 strip 12  . potassium  chloride (K-DUR) 10 MEQ tablet TAKE 1 TABLET BY MOUTH DAILY 90 tablet 1   No current facility-administered medications for this visit.    Allergies:   Lisinopril, Penicillins, and Losartan    Social History:  The patient  reports that she has been smoking cigarettes. She has a 60.00 pack-year smoking history. She has never used smokeless tobacco. She reports previous drug use. Drug: Marijuana. She reports that she does not drink alcohol.   Family History:  The patient's family history includes Cancer in her mother; Heart disease in her father; Hemachromatosis in an other family member; Hodgkin's lymphoma in her cousin.    ROS:  Please see the history of present illness.   Otherwise, review of systems are positive for none.   All other systems are reviewed and negative.    PHYSICAL EXAM: VS:  BP 120/60 (BP Location: Left Arm, Patient Position: Sitting, Cuff Size: Normal)   Pulse 67   Ht '5\' 7"'$  (1.702 m)   Wt 154 lb 8 oz (70.1 kg)   BMI 24.20 kg/m  , BMI Body mass index is 24.2 kg/m. GEN: Well nourished, well developed, in no acute distress  HEENT: normal  Neck: no JVD, carotid bruits, or masses Cardiac: RRR; no rubs, or gallops,no edema . 2/6 crescendo decrescendo systolic murmur in the aortic area which is early peaking. Respiratory:  clear to auscultation bilaterally, normal work of breathing GI: soft, nontender, nondistended, + BS MS: no deformity or atrophy  Skin: warm and dry, no rash Neuro:  Strength and sensation are intact Psych: euthymic mood, full affect    EKG:  EKG is  ordered today. EKG showed normal sinus rhythm with incomplete right bundle branch block.  Recent Labs: 12/20/2018: ALT 21; BUN 24; Creatinine, Ser 0.65; Hemoglobin 15.6; Platelets 373; Potassium 4.1; Sodium 140; TSH 0.917    Lipid Panel    Component Value Date/Time   CHOL 130 12/20/2018 1136   TRIG 79 12/20/2018 1136   HDL 53 12/20/2018 1136   CHOLHDL 2.5 12/20/2018 1136   CHOLHDL 4  03/10/2015 1128   VLDL 37.4 03/10/2015 1128   LDLCALC 61 12/20/2018 1136   LDLDIRECT 162.7 08/18/2011 1007      Wt Readings from Last 3 Encounters:  03/22/19 154 lb 8 oz (70.1 kg)  12/20/18 149 lb 3.2 oz (67.7 kg)  09/22/18 148 lb (67.1 kg)        ASSESSMENT AND PLAN:  1.  Peripheral arterial disease: Status post bilateral common iliac artery stent placement .  She denies claudication at the present time.  Repeat Doppler studies in August.  2. Essential hypertension: Blood pressure is well controlled on current medications.  3. Hyperlipidemia: Continue treatment with atorvastatin 40 mg once daily.  Most recent  lipid profile showed an LDL of 61 and triglyceride of 79.  4. Tobacco use: I again discussed with her the importance of smoking cessation.    Disposition:   FU with me in  12 months  Signed,  Kathlyn Sacramento, MD  03/22/2019 9:25 AM    [place]

## 2019-03-22 NOTE — Patient Instructions (Signed)
Medication Instructions:  Your physician recommends that you continue on your current medications as directed. Please refer to the Current Medication list given to you today.  *If you need a refill on your cardiac medications before your next appointment, please call your pharmacy*  Lab Work: None ordered If you have labs (blood work) drawn today and your tests are completely normal, you will receive your results only by: Marland Kitchen MyChart Message (if you have MyChart) OR . A paper copy in the mail If you have any lab test that is abnormal or we need to change your treatment, we will call you to review the results.  Testing/Procedures: Your physician has requested that you have a lower extremity arterial exercise duplex. During this test, exercise and ultrasound are used to evaluate arterial blood flow in the legs. Allow one hour for this exam. There are no restrictions or special instructions.  Your physician has requested that you have an ankle brachial index (ABI). During this test an ultrasound and blood pressure cuff are used to evaluate the arteries that supply the arms and legs with blood. Allow thirty minutes for this exam. There are no restrictions or special instructions.  (LE studies to be scheduled in July 2021)  Follow-Up: At [facility], you and your health needs are our priority.  As part of our continuing mission to provide you with exceptional heart care, we have created designated Provider Care Teams.  These Care Teams include your primary Cardiologist (physician) and Advanced Practice Providers (APPs -  Physician Assistants and Nurse Practitioners) who all work together to provide you with the care you need, when you need it.  Your next appointment:   6 month(s)  The format for your next appointment:   In Person  Provider:    You may see Dr. Kirke Corin or one of the following Advanced Practice Providers on your designated Care Team:    Nicolasa Ducking, NP  Eula Listen,  PA-C  Marisue Ivan, PA-C   Other Instructions N/A

## 2019-04-25 ENCOUNTER — Other Ambulatory Visit: Payer: Self-pay | Admitting: Physician Assistant

## 2019-04-25 DIAGNOSIS — F419 Anxiety disorder, unspecified: Secondary | ICD-10-CM

## 2019-04-25 NOTE — Telephone Encounter (Signed)
  Notes to clinic Not Delegated  

## 2019-06-07 ENCOUNTER — Other Ambulatory Visit: Payer: Self-pay | Admitting: Physician Assistant

## 2019-06-07 DIAGNOSIS — I10 Essential (primary) hypertension: Secondary | ICD-10-CM

## 2019-06-07 NOTE — Telephone Encounter (Signed)
Patient is due follow up in May- attempted to call and schedule- left message to call back for f/u appointment. Courtesy RF given

## 2019-06-21 ENCOUNTER — Other Ambulatory Visit: Payer: Self-pay | Admitting: Family Medicine

## 2019-06-21 NOTE — Telephone Encounter (Signed)
Requested medication (s) are due for refill today:   Yes  Requested medication (s) are on the active medication list:   Yes  Future visit scheduled:   No   Last ordered: 06/27/2018  #90  1 refill  Clinic note:   No protocol assigned to this medication.   Requested Prescriptions  Pending Prescriptions Disp Refills   potassium chloride (KLOR-CON) 10 MEQ tablet [Pharmacy Med Name: POTASSIUM CL ER TABLETS] 90 tablet 1    Sig: TAKE 1 TABLET BY MOUTH DAILY      There is no refill protocol information for this order

## 2019-08-08 ENCOUNTER — Other Ambulatory Visit: Payer: Self-pay | Admitting: Cardiovascular Disease

## 2019-08-24 ENCOUNTER — Other Ambulatory Visit: Payer: Self-pay | Admitting: Physician Assistant

## 2019-08-24 DIAGNOSIS — I10 Essential (primary) hypertension: Secondary | ICD-10-CM

## 2019-08-28 ENCOUNTER — Ambulatory Visit (INDEPENDENT_AMBULATORY_CARE_PROVIDER_SITE_OTHER): Payer: BC Managed Care – PPO

## 2019-08-28 ENCOUNTER — Other Ambulatory Visit: Payer: Self-pay

## 2019-08-28 ENCOUNTER — Other Ambulatory Visit: Payer: Self-pay | Admitting: Cardiovascular Disease

## 2019-08-28 DIAGNOSIS — I739 Peripheral vascular disease, unspecified: Secondary | ICD-10-CM | POA: Diagnosis not present

## 2019-09-04 ENCOUNTER — Other Ambulatory Visit: Payer: Self-pay

## 2019-09-04 DIAGNOSIS — I739 Peripheral vascular disease, unspecified: Secondary | ICD-10-CM

## 2019-09-04 NOTE — Progress Notes (Signed)
va

## 2019-09-07 ENCOUNTER — Other Ambulatory Visit: Payer: Self-pay | Admitting: Physician Assistant

## 2019-09-07 DIAGNOSIS — I10 Essential (primary) hypertension: Secondary | ICD-10-CM

## 2019-09-07 DIAGNOSIS — E119 Type 2 diabetes mellitus without complications: Secondary | ICD-10-CM

## 2019-09-07 NOTE — Telephone Encounter (Signed)
Requested medication (s) are due for refill today: Yes  Requested medication (s) are on the active medication list: Yes  Last refill:  09/06/18  Future visit scheduled: No  Notes to clinic:  Prescription has expired. Sent message pt. Needs appointment.    Requested Prescriptions  Pending Prescriptions Disp Refills   ONETOUCH VERIO test strip [Pharmacy Med Name: ONE TOUCH VERIO TEST STR (NEW) 50] 100 strip 12    Sig: TEST BLOOD SUGAR ONCE DAILY      Endocrinology: Diabetes - Testing Supplies Passed - 09/07/2019  3:22 PM      Passed - Valid encounter within last 12 months    Recent Outpatient Visits           8 months ago Type 2 diabetes mellitus without complication, without long-term current use of insulin [facility])   [facility] [place], [place], PA-C   1 year ago Type 2 diabetes mellitus without complication, without long-term current use of insulin [facility])   [facility] [place], [place], [place]   1 year ago Type 2 diabetes mellitus without complication, without long-term current use of insulin [facility])   [facility] [place], [place], [place]   2 years ago Type 2 diabetes mellitus without complication, without long-term current use of insulin [facility])   [facility] [place], [place], [place]   2 years ago Type 2 diabetes mellitus without complication, without long-term current use of insulin [facility])   [facility] [place], [place], [place]       Future Appointments             In 3 weeks Brion Aliment, Dois Davenport, NP [facility] [place], [facility]             Signed Prescriptions Disp Refills   amLODipine (NORVASC) 5 MG tablet 30 tablet 0    Sig: TAKE 1 TABLET(5 MG) BY MOUTH DAILY      Cardiovascular:  Calcium Channel Blockers Failed - 09/07/2019  3:22 PM      Failed - Valid encounter within last 6 months    Recent Outpatient Visits           8 months ago Type 2 diabetes mellitus  without complication, without long-term current use of insulin [facility])   [facility] [place], [place], [place]   1 year ago Type 2 diabetes mellitus without complication, without long-term current use of insulin [facility])   [facility] [place], [place], [place]   1 year ago Type 2 diabetes mellitus without complication, without long-term current use of insulin [facility])   [facility] [place], [place], [place]   2 years ago Type 2 diabetes mellitus without complication, without long-term current use of insulin [facility])   [facility] [place], [place], [place]   2 years ago Type 2 diabetes mellitus without complication, without long-term current use of insulin [facility])   [facility] [place], [place], [place]       Future Appointments             In 3 weeks Brion Aliment, Dois Davenport, NP [facility], [facility]            Passed - Last BP in normal range    BP Readings from Last 1 Encounters:  03/22/19 120/60

## 2019-09-24 ENCOUNTER — Other Ambulatory Visit: Payer: Self-pay | Admitting: Physician Assistant

## 2019-09-24 DIAGNOSIS — I10 Essential (primary) hypertension: Secondary | ICD-10-CM

## 2019-09-27 ENCOUNTER — Other Ambulatory Visit: Payer: Self-pay | Admitting: Physician Assistant

## 2019-09-27 DIAGNOSIS — I10 Essential (primary) hypertension: Secondary | ICD-10-CM

## 2019-09-28 ENCOUNTER — Ambulatory Visit: Payer: BC Managed Care – PPO | Admitting: Nurse Practitioner

## 2019-09-28 ENCOUNTER — Ambulatory Visit: Payer: BC Managed Care – PPO | Admitting: Cardiovascular Disease

## 2019-10-05 ENCOUNTER — Other Ambulatory Visit: Payer: Self-pay | Admitting: Physician Assistant

## 2019-10-05 DIAGNOSIS — I10 Essential (primary) hypertension: Secondary | ICD-10-CM

## 2019-10-05 NOTE — Telephone Encounter (Signed)
Call to patient- follow up appointment scheduled- patient has already had courtesy RF- #7 pills given

## 2019-10-11 ENCOUNTER — Encounter: Payer: Self-pay | Admitting: Physician Assistant

## 2019-10-11 ENCOUNTER — Telehealth (INDEPENDENT_AMBULATORY_CARE_PROVIDER_SITE_OTHER): Payer: BC Managed Care – PPO | Admitting: Physician Assistant

## 2019-10-11 DIAGNOSIS — I10 Essential (primary) hypertension: Secondary | ICD-10-CM | POA: Diagnosis not present

## 2019-10-11 MED ORDER — HYDROCHLOROTHIAZIDE 25 MG PO TABS: 25.0000 mg | ORAL_TABLET | Freq: Every day | ORAL | 1 refills | Status: DC

## 2019-10-11 MED ORDER — AMLODIPINE BESYLATE 5 MG PO TABS: ORAL_TABLET | ORAL | 1 refills | Status: DC

## 2019-10-11 NOTE — Progress Notes (Signed)
MyChart Video Visit    Virtual Visit via Video Note   This visit type was conducted due to national recommendations for restrictions regarding the COVID-19 Pandemic (e.g. social distancing) in an effort to limit this patient's exposure and mitigate transmission in our community. This patient is at least at moderate risk for complications without adequate follow up. This format is felt to be most appropriate for this patient at this time. Physical exam was limited by quality of the video and audio technology used for the visit.   I connected with Sara Roy on 10/11/19 at  5:20 PM EDT by a video enabled telemedicine application and verified that I am speaking with the correct person using two identifiers.  I discussed the limitations of evaluation and management by telemedicine and the availability of in person appointments. The patient expressed understanding and agreed to proceed.  Patient location: Home Provider location: [facility]   Patient: Sara Roy   DOB: July 28, 1960   59 y.o. Female  MRN: [id number] Visit Date: 10/11/2019  Today's healthcare provider: Mar Daring, PA-C   Chief Complaint  Patient presents with  . Hypertension   Subjective    HPI  Hypertension, follow-up  BP Readings from Last 3 Encounters:  10/11/19 (!) 142/68  03/22/19 120/60  12/20/18 (!) 152/75   Wt Readings from Last 3 Encounters:  03/22/19 154 lb 8 oz (70.1 kg)  12/20/18 149 lb 3.2 oz (67.7 kg)  09/22/18 148 lb (67.1 kg)     She was last seen for hypertension 9 months ago.  BP at that visit was 152/75. Management since that visit includes continue current medication plan.  She reports excellent compliance with treatment. She is not having side effects.  She is following a Regular diet. She is not exercising. She does smoke.   Outside blood pressures are not being checked. Symptoms: No chest pain No chest pressure  No palpitations No syncope  No dyspnea No orthopnea  No  paroxysmal nocturnal dyspnea No lower extremity edema   Pertinent labs: Lab Results  Component Value Date   CHOL 130 12/20/2018   HDL 53 12/20/2018   LDLCALC 61 12/20/2018   LDLDIRECT 162.7 08/18/2011   TRIG 79 12/20/2018   CHOLHDL 2.5 12/20/2018   Lab Results  Component Value Date   NA 140 12/20/2018   K 4.1 12/20/2018   CREATININE 0.65 12/20/2018   GFRNONAA 97 12/20/2018   GFRAA 112 12/20/2018   GLUCOSE 81 12/20/2018     The 10-year ASCVD risk score Mikey Bussing DC Jr., et al., 2013) is: 16.9%   ---------------------------------------------------------------------------------------------------  Patient Active Problem List   Diagnosis Date Noted  . Acute bronchitis with COPD ([place]) 12/20/2018  . Type 2 diabetes mellitus without complication, without long-term current use of insulin ([place]) 08/03/2017  . Sebaceous cyst 06/16/2016  . Angiodysplasia of intestine with hemorrhage   . Rectal polyp   . Anemia 10/02/2015  . Vitamin D deficiency 10/02/2015  . Family history of hemochromatosis 03/10/2015  . Cardiac murmur 07/25/2014  . PAD (peripheral artery disease) ([place]) 10/10/2013  . Peripheral arterial disease ([place]) 09/14/2013  . Shortness of breath 09/14/2013  . Tobacco use 09/14/2013  . Hyperlipidemia   . Leukocytosis 08/18/2011  . Elevated hemoglobin ([place]) 08/18/2011  . Anxiety   . Hypertension    Past Medical History:  Diagnosis Date  . Allergy   . Anemia   . Anxiety   . Family history of early CAD   . Heart murmur   .  HOH (hard of hearing)    reads lips wears hearing aids  . Hyperlipidemia   . Hypertension   . Peripheral vascular disease ([place])   . Substance abuse ([place])    Quit drinking alcohol 6 years ago  . Tobacco abuse       Medications: Outpatient Medications Prior to Visit  Medication Sig  . albuterol (VENTOLIN HFA) 108 (90 Base) MCG/ACT inhaler Inhale 2 puffs into the lungs every 6 (six) hours as needed for wheezing or shortness of breath.  . ALPRAZolam  (XANAX) 0.25 MG tablet TAKE 1 TABLET(0.25 MG) BY MOUTH THREE TIMES DAILY  . aspirin EC 81 MG tablet Take 1 tablet (81 mg total) by mouth daily.  Marland Kitchen atorvastatin (LIPITOR) 40 MG tablet TAKE 1 TABLET BY MOUTH EVERY DAY  . Blood Glucose Monitoring Suppl (ONETOUCH VERIO) w/Device KIT 1 kit by Does not apply route daily before breakfast.  . fluticasone (FLONASE) 50 MCG/ACT nasal spray Place 2 sprays into both nostrils daily.  . fluticasone furoate-vilanterol (BREO ELLIPTA) 200-25 MCG/INH AEPB Inhale 1 puff into the lungs daily.  . Lancets (ONETOUCH DELICA PLUS [id number]) MISC USE TO CHECK BLOOD SUGAR ONCE DAILY  . mometasone (NASONEX) 50 MCG/ACT nasal spray Place 2 sprays into the nose daily.  Marland Kitchen nystatin (MYCOSTATIN) 100000 UNIT/ML suspension Take 5 mLs (500,000 Units total) by mouth 4 (four) times daily.  Glory Rosebush VERIO test strip TEST BLOOD SUGAR ONCE DAILY  . potassium chloride (KLOR-CON) 10 MEQ tablet TAKE 1 TABLET BY MOUTH DAILY  . [DISCONTINUED] amLODipine (NORVASC) 5 MG tablet TAKE 1 TABLET(5 MG) BY MOUTH DAILY  . [DISCONTINUED] hydrochlorothiazide (HYDRODIURIL) 25 MG tablet TAKE 1 TABLET BY MOUTH EVERY DAY   No facility-administered medications prior to visit.    Review of Systems  Constitutional: Negative for appetite change, chills, fatigue and fever.  Respiratory: Negative for chest tightness and shortness of breath.   Cardiovascular: Negative for chest pain and palpitations.  Gastrointestinal: Negative for abdominal pain, nausea and vomiting.  Neurological: Negative for dizziness and weakness.    Last CBC Lab Results  Component Value Date   WBC 12.6 (H) 12/20/2018   HGB 15.6 12/20/2018   HCT 46.3 12/20/2018   MCV 89 12/20/2018   MCH 29.8 12/20/2018   RDW 13.4 12/20/2018   PLT 373 01/60/1093   Last metabolic panel Lab Results  Component Value Date   GLUCOSE 81 12/20/2018   NA 140 12/20/2018   K 4.1 12/20/2018   CL 103 12/20/2018   CO2 23 12/20/2018   BUN 24  12/20/2018   CREATININE 0.65 12/20/2018   GFRNONAA 97 12/20/2018   GFRAA 112 12/20/2018   CALCIUM 9.8 12/20/2018   PROT 6.7 12/20/2018   ALBUMIN 4.4 12/20/2018   LABGLOB 2.3 12/20/2018   AGRATIO 1.9 12/20/2018   BILITOT 0.4 12/20/2018   ALKPHOS 76 12/20/2018   AST 22 12/20/2018   ALT 21 12/20/2018   ANIONGAP 12 04/14/2017      Objective    BP (!) 142/68 (BP Location: Left Arm, Patient Position: Sitting, Cuff Size: Large)  BP Readings from Last 3 Encounters:  10/11/19 (!) 142/68  03/22/19 120/60  12/20/18 (!) 152/75   Wt Readings from Last 3 Encounters:  03/22/19 154 lb 8 oz (70.1 kg)  12/20/18 149 lb 3.2 oz (67.7 kg)  09/22/18 148 lb (67.1 kg)      Physical Exam Vitals reviewed.  Constitutional:      Appearance: Normal appearance. She is well-developed.  HENT:  Head: Normocephalic and atraumatic.  Pulmonary:     Effort: Pulmonary effort is normal. No respiratory distress.  Musculoskeletal:     Cervical back: Normal range of motion and neck supple.  Neurological:     Mental Status: She is alert.  Psychiatric:        Mood and Affect: Mood normal.        Behavior: Behavior normal.        Thought Content: Thought content normal.        Judgment: Judgment normal.        Assessment & Plan     1. Essential hypertension Stable. Diagnosis pulled for medication refill. Continue current medical treatment plan. - amLODipine (NORVASC) 5 MG tablet; TAKE 1 TABLET(5 MG) BY MOUTH DAILY  Dispense: 90 tablet; Refill: 1 - hydrochlorothiazide (HYDRODIURIL) 25 MG tablet; Take 1 tablet (25 mg total) by mouth daily.  Dispense: 90 tablet; Refill: 1   Return in about 2 months (around 12/24/2019) for CPE.     I discussed the assessment and treatment plan with the patient. The patient was provided an opportunity to ask questions and all were answered. The patient agreed with the plan and demonstrated an understanding of the instructions.   The patient was advised to call back  or seek an in-person evaluation if the symptoms worsen or if the condition fails to improve as anticipated.  I provided 7 minutes of non-face-to-face time during this encounter.  Reynolds Bowl, PA-C, have reviewed all documentation for this visit. The documentation on 10/11/19 for the exam, diagnosis, procedures, and orders are all accurate and complete.  [facility] [phone] (phone) [phone] (fax)  [place]

## 2019-10-11 NOTE — Patient Instructions (Signed)
DASH Eating Plan DASH stands for "Dietary Approaches to Stop Hypertension." The DASH eating plan is a healthy eating plan that has been shown to reduce high blood pressure (hypertension). It may also reduce your risk for type 2 diabetes, heart disease, and stroke. The DASH eating plan may also help with weight loss. What are tips for following this plan?  General guidelines  Avoid eating more than 2,300 mg (milligrams) of salt (sodium) a day. If you have hypertension, you may need to reduce your sodium intake to 1,500 mg a day.  Limit alcohol intake to no more than 1 drink a day for nonpregnant women and 2 drinks a day for men. One drink equals 12 oz of beer, 5 oz of wine, or 1 oz of hard liquor.  Work with your health care provider to maintain a healthy body weight or to lose weight. Ask what an ideal weight is for you.  Get at least 30 minutes of exercise that causes your heart to beat faster (aerobic exercise) most days of the week. Activities may include walking, swimming, or biking.  Work with your health care provider or diet and nutrition specialist (dietitian) to adjust your eating plan to your individual calorie needs. Reading food labels   Check food labels for the amount of sodium per serving. Choose foods with less than 5 percent of the Daily Value of sodium. Generally, foods with less than 300 mg of sodium per serving fit into this eating plan.  To find whole grains, look for the word "whole" as the first word in the ingredient list. Shopping  Buy products labeled as "low-sodium" or "no salt added."  Buy fresh foods. Avoid canned foods and premade or frozen meals. Cooking  Avoid adding salt when cooking. Use salt-free seasonings or herbs instead of table salt or sea salt. Check with your health care provider or pharmacist before using salt substitutes.  Do not fry foods. Cook foods using healthy methods such as baking, boiling, grilling, and broiling instead.  Cook with  heart-healthy oils, such as olive, canola, soybean, or sunflower oil. Meal planning  Eat a balanced diet that includes: ? 5 or more servings of fruits and vegetables each day. At each meal, try to fill half of your plate with fruits and vegetables. ? Up to 6-8 servings of whole grains each day. ? Less than 6 oz of lean meat, poultry, or fish each day. A 3-oz serving of meat is about the same size as a deck of cards. One egg equals 1 oz. ? 2 servings of low-fat dairy each day. ? A serving of nuts, seeds, or beans 5 times each week. ? Heart-healthy fats. Healthy fats called Omega-3 fatty acids are found in foods such as flaxseeds and coldwater fish, like sardines, salmon, and mackerel.  Limit how much you eat of the following: ? Canned or prepackaged foods. ? Food that is high in trans fat, such as fried foods. ? Food that is high in saturated fat, such as fatty meat. ? Sweets, desserts, sugary drinks, and other foods with added sugar. ? Full-fat dairy products.  Do not salt foods before eating.  Try to eat at least 2 vegetarian meals each week.  Eat more home-cooked food and less restaurant, buffet, and fast food.  When eating at a restaurant, ask that your food be prepared with less salt or no salt, if possible. What foods are recommended? The items listed may not be a complete list. Talk with your dietitian about   what dietary choices are best for you. Grains Whole-grain or whole-wheat bread. Whole-grain or whole-wheat pasta. Brown rice. Oatmeal. Quinoa. Bulgur. Whole-grain and low-sodium cereals. Pita bread. Low-fat, low-sodium crackers. Whole-wheat flour tortillas. Vegetables Fresh or frozen vegetables (raw, steamed, roasted, or grilled). Low-sodium or reduced-sodium tomato and vegetable juice. Low-sodium or reduced-sodium tomato sauce and tomato paste. Low-sodium or reduced-sodium canned vegetables. Fruits All fresh, dried, or frozen fruit. Canned fruit in natural juice (without  added sugar). Meat and other protein foods Skinless chicken or turkey. Ground chicken or turkey. Pork with fat trimmed off. Fish and seafood. Egg whites. Dried beans, peas, or lentils. Unsalted nuts, nut butters, and seeds. Unsalted canned beans. Lean cuts of beef with fat trimmed off. Low-sodium, lean deli meat. Dairy Low-fat (1%) or fat-free (skim) milk. Fat-free, low-fat, or reduced-fat cheeses. Nonfat, low-sodium ricotta or cottage cheese. Low-fat or nonfat yogurt. Low-fat, low-sodium cheese. Fats and oils Soft margarine without trans fats. Vegetable oil. Low-fat, reduced-fat, or light mayonnaise and salad dressings (reduced-sodium). Canola, safflower, olive, soybean, and sunflower oils. Avocado. Seasoning and other foods Herbs. Spices. Seasoning mixes without salt. Unsalted popcorn and pretzels. Fat-free sweets. What foods are not recommended? The items listed may not be a complete list. Talk with your dietitian about what dietary choices are best for you. Grains Baked goods made with fat, such as croissants, muffins, or some breads. Dry pasta or rice meal packs. Vegetables Creamed or fried vegetables. Vegetables in a cheese sauce. Regular canned vegetables (not low-sodium or reduced-sodium). Regular canned tomato sauce and paste (not low-sodium or reduced-sodium). Regular tomato and vegetable juice (not low-sodium or reduced-sodium). Pickles. Olives. Fruits Canned fruit in a light or heavy syrup. Fried fruit. Fruit in cream or butter sauce. Meat and other protein foods Fatty cuts of meat. Ribs. Fried meat. Bacon. Sausage. Bologna and other processed lunch meats. Salami. Fatback. Hotdogs. Bratwurst. Salted nuts and seeds. Canned beans with added salt. Canned or smoked fish. Whole eggs or egg yolks. Chicken or turkey with skin. Dairy Whole or 2% milk, cream, and half-and-half. Whole or full-fat cream cheese. Whole-fat or sweetened yogurt. Full-fat cheese. Nondairy creamers. Whipped toppings.  Processed cheese and cheese spreads. Fats and oils Butter. Stick margarine. Lard. Shortening. Ghee. Bacon fat. Tropical oils, such as coconut, palm kernel, or palm oil. Seasoning and other foods Salted popcorn and pretzels. Onion salt, garlic salt, seasoned salt, table salt, and sea salt. Worcestershire sauce. Tartar sauce. Barbecue sauce. Teriyaki sauce. Soy sauce, including reduced-sodium. Steak sauce. Canned and packaged gravies. Fish sauce. Oyster sauce. Cocktail sauce. Horseradish that you find on the shelf. Ketchup. Mustard. Meat flavorings and tenderizers. Bouillon cubes. Hot sauce and Tabasco sauce. Premade or packaged marinades. Premade or packaged taco seasonings. Relishes. Regular salad dressings. Where to find more information:  National Heart, Lung, and Blood Institute: www.nhlbi.nih.gov  American Heart Association: www.heart.org Summary  The DASH eating plan is a healthy eating plan that has been shown to reduce high blood pressure (hypertension). It may also reduce your risk for type 2 diabetes, heart disease, and stroke.  With the DASH eating plan, you should limit salt (sodium) intake to 2,300 mg a day. If you have hypertension, you may need to reduce your sodium intake to 1,500 mg a day.  When on the DASH eating plan, aim to eat more fresh fruits and vegetables, whole grains, lean proteins, low-fat dairy, and heart-healthy fats.  Work with your health care provider or diet and nutrition specialist (dietitian) to adjust your eating plan to your   individual calorie needs. This information is not intended to replace advice given to you by your health care provider. Make sure you discuss any questions you have with your health care provider. Document Revised: 01/07/2017 Document Reviewed: 01/19/2016 Elsevier Patient Education  2020 Elsevier Inc.  

## 2019-10-12 ENCOUNTER — Ambulatory Visit: Payer: BC Managed Care – PPO | Admitting: Nurse Practitioner

## 2019-10-12 ENCOUNTER — Encounter: Payer: Self-pay | Admitting: Nurse Practitioner

## 2019-10-12 ENCOUNTER — Other Ambulatory Visit: Payer: Self-pay

## 2019-10-12 VITALS — BP 138/60 | HR 56 | Ht 67.0 in | Wt 165.1 lb

## 2019-10-12 DIAGNOSIS — Z720 Tobacco use: Secondary | ICD-10-CM | POA: Diagnosis not present

## 2019-10-12 DIAGNOSIS — I739 Peripheral vascular disease, unspecified: Secondary | ICD-10-CM | POA: Diagnosis not present

## 2019-10-12 DIAGNOSIS — I10 Essential (primary) hypertension: Secondary | ICD-10-CM | POA: Diagnosis not present

## 2019-10-12 DIAGNOSIS — E782 Mixed hyperlipidemia: Secondary | ICD-10-CM

## 2019-10-12 MED ORDER — AMLODIPINE BESYLATE 10 MG PO TABS: 10.0000 mg | ORAL_TABLET | Freq: Every day | ORAL | 3 refills | Status: DC

## 2019-10-12 NOTE — Patient Instructions (Signed)
Medication Instructions:  1- INCREASE Amlodipine Take 1 tablet (10 mg total) by mouth daily *If you need a refill on your cardiac medications before your next appointment, please call your pharmacy*   Lab Work: None ordered If you have labs (blood work) drawn today and your tests are completely normal, you will receive your results only by: Marland Kitchen MyChart Message (if you have MyChart) OR . A paper copy in the mail If you have any lab test that is abnormal or we need to change your treatment, we will call you to review the results.   Testing/Procedures: We will call you closer to time to schedule ABI/aorta ultrasound   Follow-Up: At [facility], you and your health needs are our priority.  As part of our continuing mission to provide you with exceptional heart care, we have created designated Provider Care Teams.  These Care Teams include your primary Cardiologist (physician) and Advanced Practice Providers (APPs -  Physician Assistants and Nurse Practitioners) who all work together to provide you with the care you need, when you need it.  We recommend signing up for the patient portal called "MyChart".  Sign up information is provided on this After Visit Summary.  MyChart is used to connect with patients for Virtual Visits (Telemedicine).  Patients are able to view lab/test results, encounter notes, upcoming appointments, etc.  Non-urgent messages can be sent to your provider as well.   To learn more about what you can do with MyChart, go to [email or web address].    Your next appointment:   12 month(s)  The format for your next appointment:   In Person  Provider:    Please see Lorine Bears, MD

## 2019-10-12 NOTE — Progress Notes (Signed)
Office Visit    Patient Name: Sara Roy Date of Encounter: 10/12/2019  Primary Care Provider:  Mar Daring, PA-C Primary Cardiologist:  Kathlyn Sacramento, MD  Chief Complaint    59 y/o ? w/ a h/o PAD s/p bilat iliac stenting in 10/2013, hypertension, hyperlipidemia, type 2 diabetes mellitus, tobacco abuse, and obesity, who presents for follow-up related to peripheral arterial disease.  Past Medical History    Past Medical History:  Diagnosis Date  . Allergy   . Anemia   . Anxiety   . Diastolic dysfunction    a. 07/2014 Echo: EF 60-65%, no rwma, Gr1 DD. Mildly dil LA. Nl RV fxn.  . Family history of early CAD   . Heart murmur   . History of stress test    a. 07/2014 Myoview: EF 55-65%. Small, mod apical defect w/o apical wma->likely apical thinning (vs apical infarct). No ischemia-->Low Risk  . HOH (hard of hearing)    reads lips wears hearing aids  . Hyperlipidemia   . Hypertension   . Peripheral vascular disease ([place])    a. 10/2013 s/p dist Ao/bilat iliac kissing stents; b. 08/2019 ABI: R 1.05, L 1.07. Stable IVC/Iliac velocities w/ mild inc in LCIA and bilat Ext Iliacs.  . Substance abuse ([place])    Quit drinking alcohol 6 years ago  . Tobacco abuse    Past Surgical History:  Procedure Laterality Date  . ABDOMINAL AORTAGRAM N/A 10/10/2013   Procedure: ABDOMINAL Maxcine Ham;  Surgeon: Wellington Hampshire, MD;  Location: [facility] CATH [facility];  Service: Cardiovascular;  Laterality: N/A;  . ABDOMINAL HYSTERECTOMY    . CHOLECYSTECTOMY    . COLONOSCOPY WITH PROPOFOL N/A 10/16/2015   Procedure: COLONOSCOPY WITH PROPOFOL;  Surgeon: Lucilla Lame, MD;  Location: [facility];  Service: Endoscopy;  Laterality: N/A;  . LOWER EXTREMITY ANGIOGRAM Bilateral 10/10/2013   ILIAC   BILATERAL   BY DR ARDA  . POLYPECTOMY  10/16/2015   Procedure: POLYPECTOMY;  Surgeon: Lucilla Lame, MD;  Location: [facility];  Service: Endoscopy;;  . SKIN CANCER EXCISION      Allergies  Allergies    Allergen Reactions  . Lisinopril     Rash   . Penicillins Hives  . Losartan Rash    History of Present Illness    59 year old female with above complex past medical history including peripheral arterial disease, hypertension, hyperlipidemia, type 2 diabetes mellitus, tobacco abuse, and obesity.  In September 2015, in the setting of severe claudication, she underwent bilateral kissing stent placement to the common iliac arteries/distal aorta.  In June 2016, an echocardiogram was performed in the setting of finding of heart murmur.  This showed an EF of 60-65% without regional wall motion abnormalities, and grade 1 diastolic dysfunction.  The aortic valve was not well visualized.  She also underwent stress testing in June 2016 which showed a small, moderate apical defect without apical wall motion abnormality, likely representing apical thinning.  No ischemia was noted and the study was deemed low risk.  She was last seen in cardiology clinic in February of this year, at which time she was doing well without claudication.  She underwent repeat annual ABIs in July which showed normal bilateral ABIs and stable aortoiliac velocities as outlined in the past medical history.  Since her last visit, she has done well.  She has not experienced any claudication, chest pain, or dyspnea.  She notes that ever since losing weight dating back to 2019, she has had  significant improvement in activity tolerance.  She continues to smoke a pack and a half a day and is not interested in quitting.  She has not received her Covid vaccine we did discuss potential risks and benefits today.  She is still not interested.  Blood pressure has been trending in the high 130s to 150s at home.  She denies palpitations, PND, orthopnea, dizziness, syncope, edema, or early satiety.  Home Medications    Prior to Admission medications   Medication Sig Start Date End Date Taking? Authorizing Provider  albuterol (VENTOLIN HFA) 108 (90  Base) MCG/ACT inhaler Inhale 2 puffs into the lungs every 6 (six) hours as needed for wheezing or shortness of breath. 01/24/19   Mar Daring, PA-C  ALPRAZolam Duanne Moron) 0.25 MG tablet TAKE 1 TABLET(0.25 MG) BY MOUTH THREE TIMES DAILY 04/26/19   Mar Daring, PA-C  amLODipine (NORVASC) 5 MG tablet TAKE 1 TABLET(5 MG) BY MOUTH DAILY 10/11/19   Mar Daring, PA-C  aspirin EC 81 MG tablet Take 1 tablet (81 mg total) by mouth daily. 10/26/13   Wellington Hampshire, MD  atorvastatin (LIPITOR) 40 MG tablet TAKE 1 TABLET BY MOUTH EVERY DAY 08/08/19   Wellington Hampshire, MD  Blood Glucose Monitoring Suppl (ONETOUCH VERIO) w/Device KIT 1 kit by Does not apply route daily before breakfast. 08/03/17   Burnette, Clearnce Sorrel, PA-C  fluticasone (FLONASE) 50 MCG/ACT nasal spray Place 2 sprays into both nostrils daily. 01/12/19   Mar Daring, PA-C  fluticasone furoate-vilanterol (BREO ELLIPTA) 200-25 MCG/INH AEPB Inhale 1 puff into the lungs daily. 07/18/17   Mar Daring, PA-C  hydrochlorothiazide (HYDRODIURIL) 25 MG tablet Take 1 tablet (25 mg total) by mouth daily. 10/11/19   Mar Daring, PA-C  Lancets (ONETOUCH DELICA PLUS [id number]) MISC USE TO CHECK BLOOD SUGAR ONCE DAILY 09/06/18   Mar Daring, PA-C  mometasone (NASONEX) 50 MCG/ACT nasal spray Place 2 sprays into the nose daily. 08/03/17   Mar Daring, PA-C  nystatin (MYCOSTATIN) 100000 UNIT/ML suspension Take 5 mLs (500,000 Units total) by mouth 4 (four) times daily. 08/10/17   Mar Daring, PA-C  ONETOUCH VERIO test strip TEST BLOOD SUGAR ONCE DAILY 09/07/19   Mar Daring, PA-C  potassium chloride (KLOR-CON) 10 MEQ tablet TAKE 1 TABLET BY MOUTH DAILY 06/21/19   Mar Daring, PA-C    Review of Systems    She denies chest pain, palpitations, dyspnea, pnd, orthopnea, n, v, dizziness, syncope, edema, weight gain, or early satiety.  All other systems reviewed and are otherwise negative  except as noted above.  Physical Exam    VS:  BP 138/60 (BP Location: Left Arm, Patient Position: Sitting, Cuff Size: Normal)   Pulse (!) 56   Ht _0  (1.702 m)   Wt 165 lb 2 oz (74.9 kg)   SpO2 97%   BMI 25.86 kg/m  , BMI Body mass index is 25.86 kg/m. GEN: Well nourished, well developed, in no acute distress. HEENT: normal. Neck: Supple, no JVD, carotid bruits, or masses. Cardiac: RRR, 3/6 SEM @ upper sternal borders, no rubs, or gallops. No clubbing, cyanosis, edema.  PT 2+ on R, 1+ on L.  Respiratory:  Respirations regular and unlabored, diminished @ bilat bases, otw CTA. GI: Soft, nontender, nondistended, BS + x 4. MS: no deformity or atrophy. Skin: warm and dry, no rash. Neuro:  Strength and sensation are intact. Psych: Normal affect.  Accessory Clinical Findings    ECG personally  reviewed by me today - sinus brady, 56  - no acute changes.  Lab Results  Component Value Date   WBC 12.6 (H) 12/20/2018   HGB 15.6 12/20/2018   HCT 46.3 12/20/2018   MCV 89 12/20/2018   PLT 373 12/20/2018   Lab Results  Component Value Date   CREATININE 0.65 12/20/2018   BUN 24 12/20/2018   NA 140 12/20/2018   K 4.1 12/20/2018   CL 103 12/20/2018   CO2 23 12/20/2018   Lab Results  Component Value Date   ALT 21 12/20/2018   AST 22 12/20/2018   ALKPHOS 76 12/20/2018   BILITOT 0.4 12/20/2018   Lab Results  Component Value Date   CHOL 130 12/20/2018   HDL 53 12/20/2018   LDLCALC 61 12/20/2018   LDLDIRECT 162.7 08/18/2011   TRIG 79 12/20/2018   CHOLHDL 2.5 12/20/2018    Lab Results  Component Value Date   HGBA1C 5.5 12/20/2018    Assessment & Plan    1.  Peripheral arterial disease: Status post distal aorta and bilateral iliac kissing stents in September 2015.  She has been doing well without claudication.  Recent ABIs in July were normal with stable distal aorta/iliac velocities.  She remains on aspirin and statin therapy.  Walking encouraged.  2.  Essential  hypertension: Blood pressure have been trending in the high 130s to 150s at home.  She does not experience lower extremity edema.  I will increase her amlodipine to 10 mg daily.  She otherwise remains on HCTZ.  3.  Hyperlipidemia: LDL 61 in November.  She is due for follow-up labs with primary care this November.  She remains on statin therapy.  4.  Type 2 diabetes mellitus: Significant improvement in A1c with weight loss.  A1c was 5.5 last November.  Management per primary care.  5.  Ongoing tobacco abuse: She continues to smoke a pack and a half a day.  Complete cessation advised.  She is not currently contemplating quitting.  6.  Systolic murmur: Patient with systolic murmur at the upper sternal borders, likely aortic in nature.  Previous echo in 2016 did not adequately visualize the aortic valve.  She is asymptomatic.  Consider repeat echo within the next few years.    7.  Disposition: Follow-up ABIs next July with office follow-up in 1 year.   Murray Hodgkins, NP 10/12/2019, 8:16 AM

## 2019-10-24 ENCOUNTER — Other Ambulatory Visit: Payer: Self-pay | Admitting: Physician Assistant

## 2019-10-24 DIAGNOSIS — I10 Essential (primary) hypertension: Secondary | ICD-10-CM

## 2019-10-26 ENCOUNTER — Telehealth: Payer: Self-pay | Admitting: Cardiovascular Disease

## 2019-10-26 NOTE — Telephone Encounter (Addendum)
DPR on file. Spoke with the patients niece Shanda Bumps.  Shanda Bumps sts that since the patients Amlodipine 10 mg qd was increased the patient has been having episodes of "a flushing feeling". The BPs provided were taken during one of those episodes. Shanda Bumps is not sure what time of day the patient is taking the medication, the patient reports the episodes occurr through the day. Everlean Cherry that I did not think the Amlodipine was related to the patients symptoms. Shanda Bumps would like Dr.Arida's thoughts. Princella Pellegrini that I will fwd the message to Dr. Kirke Corin and call back with his response.

## 2019-10-26 NOTE — Telephone Encounter (Signed)
Patient niece states she is having hot flashes and thinks it is from her Lisinopril.  BP readings: 9/13- 140/70 9/14-137/67 9/15-146/71 9/16-138/67

## 2019-10-27 NOTE — Telephone Encounter (Signed)
Agree. I don't think it is from BP meds. Should discuss with PCP

## 2019-10-29 NOTE — Telephone Encounter (Signed)
DPR on file. Patients niece Shanda Bumps made aware of Dr. Jari Sportsman response and recommendation with verbalized understanding.

## 2019-11-14 ENCOUNTER — Other Ambulatory Visit: Payer: Self-pay | Admitting: Cardiovascular Disease

## 2019-12-08 ENCOUNTER — Other Ambulatory Visit: Payer: Self-pay | Admitting: Physician Assistant

## 2019-12-08 DIAGNOSIS — F419 Anxiety disorder, unspecified: Secondary | ICD-10-CM

## 2019-12-08 NOTE — Telephone Encounter (Signed)
Requested medication (s) are due for refill today: yes  Requested medication (s) are on the active medication list: yes  Last refill:  04/26/19  Future visit scheduled: no  Notes to clinic:  med not delegated to NT to RF   Requested Prescriptions  Pending Prescriptions Disp Refills   ALPRAZolam (XANAX) 0.25 MG tablet [Pharmacy Med Name: ALPRAZOLAM 0.25MG  TABLETS] 90 tablet     Sig: TAKE 1 TABLET(0.25 MG) BY MOUTH THREE TIMES DAILY      Not Delegated - Psychiatry:  Anxiolytics/Hypnotics Failed - 12/08/2019  6:53 PM      Failed - This refill cannot be delegated      Failed - Urine Drug Screen completed in last 360 days      Passed - Valid encounter within last 6 months    Recent Outpatient Visits           1 month ago Essential hypertension   [facility] Joycelyn Man M, PA-C   11 months ago Type 2 diabetes mellitus without complication, without long-term current use of insulin [facility])   [facility] [place], [place], [place]   1 year ago Type 2 diabetes mellitus without complication, without long-term current use of insulin [facility])   [facility] [place], [place], [place]   1 year ago Type 2 diabetes mellitus without complication, without long-term current use of insulin [facility])   [facility] [place], [place], [place]   2 years ago Type 2 diabetes mellitus without complication, without long-term current use of insulin [facility])   [facility] [place], [place], [place]

## 2019-12-10 ENCOUNTER — Other Ambulatory Visit: Payer: Self-pay | Admitting: Physician Assistant

## 2019-12-10 DIAGNOSIS — I10 Essential (primary) hypertension: Secondary | ICD-10-CM

## 2019-12-10 NOTE — Telephone Encounter (Signed)
Medication Refill - Medication: Amlodipine   Has the patient contacted their pharmacy? Yes.    (Agent: If no, request that the patient contact the pharmacy for the refill.) (Agent: If yes, when and what did the pharmacy advise?)  Preferred Pharmacy (with phone number or street name):  [id number] - [place], [place] - [address] AT [address]  [address] [place] [address]  Phone: [phone] Fax: [phone]  Hours: Not open 24 hours     Agent: Please be advised that RX refills may take up to 3 business days. We ask that you follow-up with your pharmacy.

## 2019-12-11 ENCOUNTER — Other Ambulatory Visit: Payer: Self-pay

## 2019-12-11 DIAGNOSIS — I10 Essential (primary) hypertension: Secondary | ICD-10-CM

## 2019-12-11 MED ORDER — AMLODIPINE BESYLATE 10 MG PO TABS: 10.0000 mg | ORAL_TABLET | Freq: Every day | ORAL | 3 refills | Status: DC

## 2019-12-11 NOTE — Telephone Encounter (Signed)
*  STAT* If patient is at the pharmacy, call can be transferred to refill team.   1. Which medications need to be refilled? (please list name of each medication and dose if known) Amlodipine 5 mg, patient daughter states the 10 mg crumbles when she tries to cut  2. Which pharmacy/location (including street and city if local pharmacy) is medication to be sent to? [facility]  [address]  3. Do they need a 30 day or 90 day supply? 90

## 2019-12-11 NOTE — Telephone Encounter (Signed)
According to patient's medication list, she is supposed to take 10 mg Amlodipine daily. Daughter called stating she is taking 1/2 tablet. Please clarify which is correct?

## 2019-12-11 NOTE — Telephone Encounter (Signed)
It looks like her amlodipine was increased to 10 mg daily at her most recent visit on 10/12/2019.  We will refill this at 10 mg daily.

## 2019-12-18 DIAGNOSIS — I10 Essential (primary) hypertension: Secondary | ICD-10-CM

## 2019-12-19 MED ORDER — AMLODIPINE BESYLATE 5 MG PO TABS: 5.0000 mg | ORAL_TABLET | Freq: Two times a day (BID) | ORAL | 5 refills | Status: DC

## 2020-02-20 ENCOUNTER — Encounter: Payer: Self-pay | Admitting: Physician Assistant

## 2020-02-28 ENCOUNTER — Telehealth: Payer: Self-pay

## 2020-02-28 DIAGNOSIS — J418 Mixed simple and mucopurulent chronic bronchitis: Secondary | ICD-10-CM

## 2020-02-28 MED ORDER — ALBUTEROL SULFATE HFA 108 (90 BASE) MCG/ACT IN AERS: 2.0000 | INHALATION_SPRAY | Freq: Four times a day (QID) | RESPIRATORY_TRACT | 4 refills | Status: AC | PRN

## 2020-02-28 NOTE — Telephone Encounter (Signed)
[facility] faxed refill request for the following medications:  albuterol (VENTOLIN HFA) 108 (90 Base) MCG/ACT inhaler  Please advise. Thanks, TGH 

## 2020-02-29 ENCOUNTER — Telehealth: Payer: Self-pay | Admitting: *Deleted

## 2020-02-29 NOTE — Telephone Encounter (Signed)
If she develops symptoms she needs to reschedule, if not she can come in

## 2020-02-29 NOTE — Telephone Encounter (Signed)
Copied from CRM [id number]. Topic: General - Other >> Feb 29, 2020 10:49 AM Jaquita Rector A wrote: Reason for CRM: Patient called in to say that a coworker tested positive for Covid and she did not know if Victorino Dike want her to reschedule her visit or what to do Please call with an answer to Ph# [phone]

## 2020-02-29 NOTE — Telephone Encounter (Signed)
Please advise. Patient has an appt with you on 03/03/2020.  Thanks!

## 2020-03-03 ENCOUNTER — Other Ambulatory Visit: Payer: Self-pay

## 2020-03-03 ENCOUNTER — Encounter: Payer: Self-pay | Admitting: Physician Assistant

## 2020-03-03 ENCOUNTER — Ambulatory Visit (INDEPENDENT_AMBULATORY_CARE_PROVIDER_SITE_OTHER): Payer: BC Managed Care – PPO | Admitting: Physician Assistant

## 2020-03-03 VITALS — BP 138/74 | HR 69 | Temp 98.5°F | Ht 67.0 in | Wt 165.0 lb

## 2020-03-03 DIAGNOSIS — I10 Essential (primary) hypertension: Secondary | ICD-10-CM | POA: Diagnosis not present

## 2020-03-03 DIAGNOSIS — D582 Other hemoglobinopathies: Secondary | ICD-10-CM

## 2020-03-03 DIAGNOSIS — Z720 Tobacco use: Secondary | ICD-10-CM

## 2020-03-03 DIAGNOSIS — I739 Peripheral vascular disease, unspecified: Secondary | ICD-10-CM

## 2020-03-03 DIAGNOSIS — Z0000 Encounter for general adult medical examination without abnormal findings: Secondary | ICD-10-CM | POA: Diagnosis not present

## 2020-03-03 DIAGNOSIS — E119 Type 2 diabetes mellitus without complications: Secondary | ICD-10-CM | POA: Diagnosis not present

## 2020-03-03 DIAGNOSIS — E782 Mixed hyperlipidemia: Secondary | ICD-10-CM

## 2020-03-03 DIAGNOSIS — E559 Vitamin D deficiency, unspecified: Secondary | ICD-10-CM | POA: Diagnosis not present

## 2020-03-03 DIAGNOSIS — F419 Anxiety disorder, unspecified: Secondary | ICD-10-CM

## 2020-03-03 DIAGNOSIS — J209 Acute bronchitis, unspecified: Secondary | ICD-10-CM

## 2020-03-03 DIAGNOSIS — J440 Chronic obstructive pulmonary disease with acute lower respiratory infection: Secondary | ICD-10-CM

## 2020-03-03 DIAGNOSIS — Z6825 Body mass index (BMI) 25.0-25.9, adult: Secondary | ICD-10-CM

## 2020-03-03 NOTE — Patient Instructions (Signed)
Preventive Care 62-62 Years Old, Female Preventive care refers to lifestyle choices and visits with your health care provider that can promote health and wellness. This includes:  A yearly physical exam. This is also called an annual wellness visit.  Regular dental and eye exams.  Immunizations.  Screening for certain conditions.  Healthy lifestyle choices, such as: ? Eating a healthy diet. ? Getting regular exercise. ? Not using drugs or products that contain nicotine and tobacco. ? Limiting alcohol use. What can I expect for my preventive care visit? Physical exam Your health care provider will check your:  Height and weight. These may be used to calculate your BMI (body mass index). BMI is a measurement that tells if you are at a healthy weight.  Heart rate and blood pressure.  Body temperature.  Skin for abnormal spots. Counseling Your health care provider may ask you questions about your:  Past medical problems.  Family's medical history.  Alcohol, tobacco, and drug use.  Emotional well-being.  Home life and relationship well-being.  Sexual activity.  Diet, exercise, and sleep habits.  Work and work Statistician.  Access to firearms.  Method of birth control.  Menstrual cycle.  Pregnancy history. What immunizations do I need? Vaccines are usually given at various ages, according to a schedule. Your health care provider will recommend vaccines for you based on your age, medical history, and lifestyle or other factors, such as travel or where you work.   What tests do I need? Blood tests  Lipid and cholesterol levels. These may be checked every 5 years, or more often if you are over 62 years old.  Hepatitis C test.  Hepatitis B test. Screening  Lung cancer screening. You may have this screening every year starting at age 62 if you have a 30-pack-year history of smoking and currently smoke or have quit within the past 15 years.  Colorectal cancer  screening. ? All adults should have this screening starting at age 62 and continuing until age 17. ? Your health care provider may recommend screening at age 62 if you are at increased risk. ? You will have tests every 1-10 years, depending on your results and the type of screening test.  Diabetes screening. ? This is done by checking your blood sugar (glucose) after you have not eaten for a while (fasting). ? You may have this done every 1-3 years.  Mammogram. ? This may be done every 1-2 years. ? Talk with your health care provider about when you should start having regular mammograms. This may depend on whether you have a family history of breast cancer.  BRCA-related cancer screening. This may be done if you have a family history of breast, ovarian, tubal, or peritoneal cancers.  Pelvic exam and Pap test. ? This may be done every 3 years starting at age 62. ? Starting at age 62, this may be done every 5 years if you have a Pap test in combination with an HPV test. Other tests  STD (sexually transmitted disease) testing, if you are at risk.  Bone density scan. This is done to screen for osteoporosis. You may have this scan if you are at high risk for osteoporosis. Talk with your health care provider about your test results, treatment options, and if necessary, the need for more tests. Follow these instructions at home: Eating and drinking  Eat a diet that includes fresh fruits and vegetables, whole grains, lean protein, and low-fat dairy products.  Take vitamin and mineral supplements  as recommended by your health care provider.  Do not drink alcohol if: ? Your health care provider tells you not to drink. ? You are pregnant, may be pregnant, or are planning to become pregnant.  If you drink alcohol: ? Limit how much you have to 0-1 drink a day. ? Be aware of how much alcohol is in your drink. In the [place] one drink equals one 12 oz bottle of beer (355 mL), one 5 oz glass of  wine (148 mL), or one 1 oz glass of hard liquor (44 mL).   Lifestyle  Take daily care of your teeth and gums. Brush your teeth every morning and night with fluoride toothpaste. Floss one time each day.  Stay active. Exercise for at least 30 minutes 5 or more days each week.  Do not use any products that contain nicotine or tobacco, such as cigarettes, e-cigarettes, and chewing tobacco. If you need help quitting, ask your health care provider.  Do not use drugs.  If you are sexually active, practice safe sex. Use a condom or other form of protection to prevent STIs (sexually transmitted infections).  If you do not wish to become pregnant, use a form of birth control. If you plan to become pregnant, see your health care provider for a prepregnancy visit.  If told by your health care provider, take low-dose aspirin daily starting at age 50.  Find healthy ways to cope with stress, such as: ? Meditation, yoga, or listening to music. ? Journaling. ? Talking to a trusted person. ? Spending time with friends and family. Safety  Always wear your seat belt while driving or riding in a vehicle.  Do not drive: ? If you have been drinking alcohol. Do not ride with someone who has been drinking. ? When you are tired or distracted. ? While texting.  Wear a helmet and other protective equipment during sports activities.  If you have firearms in your house, make sure you follow all gun safety procedures. What's next?  Visit your health care provider once a year for an annual wellness visit.  Ask your health care provider how often you should have your eyes and teeth checked.  Stay up to date on all vaccines. This information is not intended to replace advice given to you by your health care provider. Make sure you discuss any questions you have with your health care provider. Document Revised: 10/30/2019 Document Reviewed: 10/06/2017 Elsevier Patient Education  2021 Elsevier Inc.  

## 2020-03-03 NOTE — Progress Notes (Signed)
   Complete physical exam   Patient: Sara Roy   DOB: 03/29/1960   59 y.o. Female  MRN: [id number] Visit Date: 03/03/2020  Today's healthcare provider: Jennifer M Burnette, PA-C   Chief Complaint  Patient presents with  . Annual Exam   Subjective    Sara Roy is a 59 y.o. female who presents today for a complete physical exam.  She reports consuming a general diet. The patient does not participate in regular exercise at present. She generally feels well. She reports sleeping well. She does not have additional problems to discuss today.  HPI    Past Medical History:  Diagnosis Date  . Allergy   . Anemia   . Anxiety   . Diastolic dysfunction    a. 07/2014 Echo: EF 60-65%, no rwma, Gr1 DD. Mildly dil LA. Nl RV fxn.  . Family history of early CAD   . Heart murmur   . History of stress test    a. 07/2014 Myoview: EF 55-65%. Small, mod apical defect w/o apical wma->likely apical thinning (vs apical infarct). No ischemia-->Low Risk  . HOH (hard of hearing)    reads lips wears hearing aids  . Hyperlipidemia   . Hypertension   . Peripheral vascular disease (HCC)    a. 10/2013 s/p dist Ao/bilat iliac kissing stents; b. 08/2019 ABI: R 1.05, L 1.07. Stable IVC/Iliac velocities w/ mild inc in LCIA and bilat Ext Iliacs.  . Substance abuse (HCC)    Quit drinking alcohol 6 years ago  . Tobacco abuse    Past Surgical History:  Procedure Laterality Date  . ABDOMINAL AORTAGRAM N/A 10/10/2013   Procedure: ABDOMINAL AORTAGRAM;  Surgeon: Muhammad A Arida, MD;  Location: [facility];  Service: Cardiovascular;  Laterality: N/A;  . ABDOMINAL HYSTERECTOMY    . CHOLECYSTECTOMY    . COLONOSCOPY WITH PROPOFOL N/A 10/16/2015   Procedure: COLONOSCOPY WITH PROPOFOL;  Surgeon: Darren Wohl, MD;  Location: [facility];  Service: Endoscopy;  Laterality: N/A;  . LOWER EXTREMITY ANGIOGRAM Bilateral 10/10/2013   ILIAC   BILATERAL   BY DR ARDA  . POLYPECTOMY  10/16/2015   Procedure: POLYPECTOMY;   Surgeon: Darren Wohl, MD;  Location: [facility];  Service: Endoscopy;;  . SKIN CANCER EXCISION     Social History   Socioeconomic History  . Marital status: Married    Spouse name: Not on file  . Number of children: Not on file  . Years of education: Not on file  . Highest education level: Not on file  Occupational History  . Not on file  Tobacco Use  . Smoking status: Current Every Day Smoker    Packs/day: 1.50    Years: 40.00    Pack years: 60.00    Types: Cigarettes  . Smokeless tobacco: Never Used  Vaping Use  . Vaping Use: Never used  Substance and Sexual Activity  . Alcohol use: No    Comment: Quit 6 years ago  . Drug use: Not Currently    Types: Marijuana  . Sexual activity: Not on file  Other Topics Concern  . Not on file  Social History Narrative  . Not on file   Social Determinants of Health   Financial Resource Strain: Not on file  Food Insecurity: Not on file  Transportation Needs: Not on file  Physical Activity: Not on file  Stress: Not on file  Social Connections: Not on file  Intimate Partner Violence: Not on file   Family Status    Relation Name Status  . Mother  Deceased       vulvuar cancer  . Father  Deceased at age 41       MI  . Cousin  (Not Specified)  . Other some siblings Alive  . Neg Hx  (Not Specified)   Family History  Problem Relation Age of Onset  . Cancer Mother        Vulva  . Heart disease Father   . Hodgkin's lymphoma Cousin   . Hemachromatosis Other   . Colon cancer Neg Hx   . Breast cancer Neg Hx    Allergies  Allergen Reactions  . Lisinopril     Rash   . Penicillins Hives  . Losartan Rash    Patient Care Team: Mar Daring, PA-C as PCP - General (Family Medicine) Wellington Hampshire, MD as PCP - Cardiology (Cardiology)   Medications: Outpatient Medications Prior to Visit  Medication Sig  . albuterol (VENTOLIN HFA) 108 (90 Base) MCG/ACT inhaler Inhale 2 puffs into the lungs every 6 (six)  hours as needed for wheezing or shortness of breath.  . ALPRAZolam (XANAX) 0.25 MG tablet TAKE 1 TABLET(0.25 MG) BY MOUTH THREE TIMES DAILY  . amLODipine (NORVASC) 5 MG tablet Take 1 tablet (5 mg total) by mouth in the morning and at bedtime.  Marland Kitchen aspirin EC 81 MG tablet Take 1 tablet (81 mg total) by mouth daily.  Marland Kitchen atorvastatin (LIPITOR) 40 MG tablet TAKE 1 TABLET BY MOUTH EVERY DAY  . Blood Glucose Monitoring Suppl (ONETOUCH VERIO) w/Device KIT 1 kit by Does not apply route daily before breakfast.  . fluticasone (FLONASE) 50 MCG/ACT nasal spray Place 2 sprays into both nostrils daily.  . hydrochlorothiazide (HYDRODIURIL) 25 MG tablet Take 1 tablet (25 mg total) by mouth daily.  . Lancets (ONETOUCH DELICA PLUS [id number]) MISC USE TO CHECK BLOOD SUGAR ONCE DAILY  . mometasone (NASONEX) 50 MCG/ACT nasal spray Place 2 sprays into the nose daily.  Glory Rosebush VERIO test strip TEST BLOOD SUGAR ONCE DAILY  . potassium chloride (KLOR-CON) 10 MEQ tablet TAKE 1 TABLET BY MOUTH DAILY  . fluticasone furoate-vilanterol (BREO ELLIPTA) 200-25 MCG/INH AEPB Inhale 1 puff into the lungs daily. (Patient not taking: Reported on 03/03/2020)  . nystatin (MYCOSTATIN) 100000 UNIT/ML suspension Take 5 mLs (500,000 Units total) by mouth 4 (four) times daily. (Patient not taking: Reported on 03/03/2020)   No facility-administered medications prior to visit.    Review of Systems  Constitutional: Negative.   HENT: Negative.   Eyes: Negative.   Respiratory: Negative.   Cardiovascular: Negative.   Gastrointestinal: Negative.   Endocrine: Negative.   Genitourinary: Negative.   Musculoskeletal: Negative.   Skin: Negative.   Allergic/Immunologic: Negative.   Neurological: Negative.   Hematological: Negative.   Psychiatric/Behavioral: Negative.       Objective    BP 138/74 (BP Location: Left Arm, Patient Position: Sitting, Cuff Size: Large)   Pulse 69   Temp 98.5 F (36.9 C) (Oral)   Ht 5' 7" (1.702 m)   Wt  165 lb (74.8 kg)   BMI 25.84 kg/m    Physical Exam Vitals reviewed.  Constitutional:      General: She is not in acute distress.    Appearance: Normal appearance. She is well-developed, normal weight and well-nourished. She is not ill-appearing or diaphoretic.  HENT:     Head: Normocephalic and atraumatic.     Right Ear: Tympanic membrane, ear canal and external ear normal.  Left Ear: Tympanic membrane, ear canal and external ear normal.     Mouth/Throat:     Mouth: Oropharynx is clear and moist.  Eyes:     General: No scleral icterus.       Right eye: No discharge.        Left eye: No discharge.     Extraocular Movements: Extraocular movements intact and EOM normal.     Conjunctiva/sclera: Conjunctivae normal.     Pupils: Pupils are equal, round, and reactive to light.  Neck:     Thyroid: No thyromegaly.     Vascular: No carotid bruit or JVD.     Trachea: No tracheal deviation.  Cardiovascular:     Rate and Rhythm: Normal rate and regular rhythm.     Pulses: Normal pulses and intact distal pulses.     Heart sounds: Murmur heard.  No friction rub. No gallop.   Pulmonary:     Effort: Pulmonary effort is normal. No respiratory distress.     Breath sounds: Normal breath sounds. No wheezing or rales.  Chest:     Chest wall: No tenderness.  Abdominal:     General: Abdomen is flat. Bowel sounds are normal. There is no distension.     Palpations: Abdomen is soft. There is no mass.     Tenderness: There is no abdominal tenderness. There is no guarding or rebound.  Musculoskeletal:        General: No edema. Normal range of motion.     Cervical back: Normal range of motion and neck supple. No tenderness.     Right lower leg: No edema.     Left lower leg: No edema.  Lymphadenopathy:     Cervical: No cervical adenopathy.  Skin:    General: Skin is warm and dry.     Capillary Refill: Capillary refill takes less than 2 seconds.     Findings: No rash.  Neurological:      General: No focal deficit present.     Mental Status: She is alert and oriented to person, place, and time. Mental status is at baseline.  Psychiatric:        Mood and Affect: Mood and affect and mood normal.        Behavior: Behavior normal.        Thought Content: Thought content normal.        Judgment: Judgment normal.      Last depression screening scores PHQ 2/9 Scores 12/20/2018 06/12/2018 05/10/2017  PHQ - 2 Score 0 0 3  PHQ- 9 Score - 0 9   Last fall risk screening Fall Risk  12/20/2018  Falls in the past year? 0  Number falls in past yr: 0  Injury with Fall? 0  Follow up Falls evaluation completed   Last Audit-C alcohol use screening Alcohol Use Disorder Test (AUDIT) 12/20/2018  1. How often do you have a drink containing alcohol? 0  2. How many drinks containing alcohol do you have on a typical day when you are drinking? 0  3. How often do you have six or more drinks on one occasion? 0  AUDIT-C Score 0   A score of 3 or more in women, and 4 or more in men indicates increased risk for alcohol abuse, EXCEPT if all of the points are from question 1   No results found for any visits on 03/03/20.  Assessment & Plan    Routine Health Maintenance and Physical Exam  Exercise Activities and Dietary recommendations   Goals   None      There is no immunization history on file for this patient.  Health Maintenance  Topic Date Due  . PNEUMOCOCCAL POLYSACCHARIDE VACCINE AGE 2-64 HIGH RISK  Never done  . COVID-19 Vaccine (1) Never done  . URINE MICROALBUMIN  Never done  . TETANUS/TDAP  Never done  . MAMMOGRAM  Never done  . FOOT EXAM  12/13/2018  . HEMOGLOBIN A1C  06/19/2019  . OPHTHALMOLOGY EXAM  11/28/2019  . INFLUENZA VACCINE  05/08/2020 (Originally 09/09/2019)  . COLONOSCOPY (Pts 45-49yrs Insurance coverage will need to be confirmed)  10/15/2025  . Hepatitis C Screening  Completed  . HIV Screening  Completed    Discussed health benefits of physical activity, and  encouraged her to engage in regular exercise appropriate for her age and condition.  1. Annual physical exam Normal physical exam today. Will check labs as below and f/u pending lab results. If labs are stable and WNL she will not need to have these rechecked for one year at her next annual physical exam. She is to call the office in the meantime if she has any acute issue, questions or concerns. - CBC w/Diff/Platelet - Comprehensive Metabolic Panel (CMET) - TSH  2. PAD (peripheral artery disease) (HCC) Stable. Followed by cardiology. Last seen 10/2019.  3. Primary hypertension Stable. Continue Amlodipine 5mg BID, HCTZ 25mg. Will check labs as below and f/u pending results. - CBC w/Diff/Platelet - Comprehensive Metabolic Panel (CMET)  4. Type 2 diabetes mellitus without complication, without long-term current use of insulin (HCC) Stable. Diet controlled. At home fasting sugar readings are all mostly below 100. Will check labs as below and f/u pending results. - CBC w/Diff/Platelet - Comprehensive Metabolic Panel (CMET) - HgB A1c  5. Vitamin D deficiency H/O this and postmenopausal. Will check labs as below and f/u pending results. - CBC w/Diff/Platelet - Vitamin D (25 hydroxy)  6. Mixed hyperlipidemia Stable. Continue atorvastatin 40mg. Will check labs as below and f/u pending results. - CBC w/Diff/Platelet - Comprehensive Metabolic Panel (CMET) - Lipid Panel With LDL/HDL Ratio  7. BMI 25.0-25.9,adult Counseled patient on healthy lifestyle modifications including dieting and exercise.   8. Acute bronchitis with COPD (HCC) Stable. Continue Breo prn. Discussed smoking cessation. Patient does not desire.   9. Elevated hemoglobin (HCC) H/O this, most likely from smoking. However, there is family history of hemachromatosis.   10. Tobacco use Patient continues to smoke 1.5 PPD. Declines cessation. Declines lung cancer screening.   11. Anxiety Stable. Continue Alprazolam 0.25mg  TID PRN.   No follow-ups on file.     I, Jennifer M Burnette, PA-C, have reviewed all documentation for this visit. The documentation on 03/03/20 for the exam, diagnosis, procedures, and orders are all accurate and complete.    Jennifer M Burnette, PA-C   Family Practice [phone] (phone) [phone] (fax)  [facility]  

## 2020-03-03 NOTE — Telephone Encounter (Signed)
Pt advised.   Thanks,   -Arlyce Circle  

## 2020-03-04 LAB — CBC WITH DIFFERENTIAL/PLATELET
Basophils Absolute: 0.1 10*3/uL (ref 0.0–0.2)
Basos: 0 %
EOS (ABSOLUTE): 0.3 10*3/uL (ref 0.0–0.4)
Eos: 2 %
Hematocrit: 43.5 % (ref 34.0–46.6)
Hemoglobin: 15.2 g/dL (ref 11.1–15.9)
Immature Grans (Abs): 0 10*3/uL (ref 0.0–0.1)
Immature Granulocytes: 0 %
Lymphocytes Absolute: 3.2 10*3/uL — ABNORMAL HIGH (ref 0.7–3.1)
Lymphs: 22 %
MCH: 30 pg (ref 26.6–33.0)
MCHC: 34.9 g/dL (ref 31.5–35.7)
MCV: 86 fL (ref 79–97)
Monocytes Absolute: 0.8 10*3/uL (ref 0.1–0.9)
Monocytes: 6 %
Neutrophils Absolute: 9.9 10*3/uL — ABNORMAL HIGH (ref 1.4–7.0)
Neutrophils: 70 %
Platelets: 406 10*3/uL (ref 150–450)
RBC: 5.07 x10E6/uL (ref 3.77–5.28)
RDW: 13.5 % (ref 11.7–15.4)
WBC: 14.3 10*3/uL — ABNORMAL HIGH (ref 3.4–10.8)

## 2020-03-04 LAB — COMPREHENSIVE METABOLIC PANEL
ALT: 18 IU/L (ref 0–32)
AST: 19 IU/L (ref 0–40)
Albumin/Globulin Ratio: 2.3 — ABNORMAL HIGH (ref 1.2–2.2)
Albumin: 4.6 g/dL (ref 3.8–4.8)
Alkaline Phosphatase: 76 IU/L (ref 44–121)
BUN/Creatinine Ratio: 24 (ref 12–28)
BUN: 20 mg/dL (ref 8–27)
Bilirubin Total: 0.3 mg/dL (ref 0.0–1.2)
CO2: 24 mmol/L (ref 20–29)
Calcium: 9.9 mg/dL (ref 8.7–10.3)
Chloride: 102 mmol/L (ref 96–106)
Creatinine, Ser: 0.83 mg/dL (ref 0.57–1.00)
GFR calc Af Amer: 88 mL/min/{1.73_m2} (ref >59)
GFR calc non Af Amer: 76 mL/min/{1.73_m2} (ref >59)
Globulin, Total: 2 g/dL (ref 1.5–4.5)
Glucose: 102 mg/dL — ABNORMAL HIGH (ref 65–99)
Potassium: 4 mmol/L (ref 3.5–5.2)
Sodium: 141 mmol/L (ref 134–144)
Total Protein: 6.6 g/dL (ref 6.0–8.5)

## 2020-03-04 LAB — HEMOGLOBIN A1C
Est. average glucose Bld gHb Est-mCnc: 114 mg/dL
Hgb A1c MFr Bld: 5.6 % (ref 4.8–5.6)

## 2020-03-04 LAB — LIPID PANEL WITH LDL/HDL RATIO
Cholesterol, Total: 135 mg/dL (ref 100–199)
HDL: 49 mg/dL (ref >39)
LDL Chol Calc (NIH): 63 mg/dL (ref 0–99)
LDL/HDL Ratio: 1.3 ratio (ref 0.0–3.2)
Triglycerides: 132 mg/dL (ref 0–149)
VLDL Cholesterol Cal: 23 mg/dL (ref 5–40)

## 2020-03-04 LAB — TSH: TSH: 0.635 u[IU]/mL (ref 0.450–4.500)

## 2020-03-04 LAB — VITAMIN D 25 HYDROXY (VIT D DEFICIENCY, FRACTURES): Vit D, 25-Hydroxy: 19.7 ng/mL — ABNORMAL LOW (ref 30.0–100.0)

## 2020-03-05 ENCOUNTER — Telehealth: Payer: Self-pay

## 2020-03-05 NOTE — Telephone Encounter (Signed)
Pt called and verbalized understanding of information below. Pt does not believe it is necessary for a hematology referral as she believes the elevated WBC is due to excessive smoking and stress.

## 2020-03-05 NOTE — Telephone Encounter (Signed)
-----   Message from Margaretann Loveless, [place] sent at 03/04/2020  8:58 AM EST ----- Sara Roy,  Your white blood cell count continues to remain elevated and is highest it has been in 2 years. It is possible it is reactive to your smoking, but I would recommend an evaluation by hematology to make sure it is nothing else. If agreeable I will place referral. Also your Vitamin D is low. You could start OTC Vit D 2000-5000 IU daily. A1c is still normal at 5.6. Cholesterol is normal. Kidney and liver function are normal. Thyroid is normal.   Daiva Nakayama, PAC

## 2020-04-21 ENCOUNTER — Other Ambulatory Visit: Payer: Self-pay | Admitting: Physician Assistant

## 2020-04-21 DIAGNOSIS — I10 Essential (primary) hypertension: Secondary | ICD-10-CM

## 2020-05-11 ENCOUNTER — Other Ambulatory Visit: Payer: Self-pay | Admitting: Cardiovascular Disease

## 2020-05-12 NOTE — Telephone Encounter (Signed)
Rx request sent to pharmacy.  

## 2020-06-10 ENCOUNTER — Other Ambulatory Visit: Payer: Self-pay

## 2020-06-10 DIAGNOSIS — I10 Essential (primary) hypertension: Secondary | ICD-10-CM

## 2020-06-10 MED ORDER — AMLODIPINE BESYLATE 5 MG PO TABS: 5.0000 mg | ORAL_TABLET | Freq: Two times a day (BID) | ORAL | 3 refills | Status: DC

## 2020-06-15 ENCOUNTER — Other Ambulatory Visit: Payer: Self-pay | Admitting: Physician Assistant

## 2020-06-16 NOTE — Telephone Encounter (Signed)
   Notes to clinic: this script has expired  Review for continue use and refill    Requested Prescriptions  Pending Prescriptions Disp Refills   potassium chloride (KLOR-CON) 10 MEQ tablet [Pharmacy Med Name: POTASSIUM CL ER TABLETS] 90 tablet 1    Sig: TAKE 1 TABLET BY MOUTH DAILY      There is no refill protocol information for this order

## 2020-08-07 ENCOUNTER — Other Ambulatory Visit: Payer: Self-pay | Admitting: Physician Assistant

## 2020-08-07 DIAGNOSIS — F419 Anxiety disorder, unspecified: Secondary | ICD-10-CM

## 2020-08-12 NOTE — Telephone Encounter (Signed)
Pt is following up on med refill for ALPRAZolam (XANAX) 0.25 MG tablet   Stated she is out of meds. Appt scheduled for 08/21/20  [id number] - [place], [place] - [address] AT [facility] [address]  [address] [place], [place] [address]  Phone:  [phone]  Fax:  [phone]

## 2020-08-12 NOTE — Telephone Encounter (Signed)
Requested medication (s) are due for refill today: yes  Requested medication (s) are on the active medication list: yes  Last refill:  12/10/19 #90 5 RF  Future visit scheduled: yes  Notes to clinic:  med not delegated to NT to RF   Requested Prescriptions  Pending Prescriptions Disp Refills   ALPRAZolam (XANAX) 0.25 MG tablet [Pharmacy Med Name: ALPRAZOLAM 0.25MG  TABLETS] 90 tablet     Sig: TAKE 1 TABLET(0.25 MG) BY MOUTH THREE TIMES DAILY      Not Delegated - Psychiatry:  Anxiolytics/Hypnotics Failed - 08/12/2020  8:57 AM      Failed - This refill cannot be delegated      Failed - Urine Drug Screen completed in last 360 days      Passed - Valid encounter within last 6 months    Recent Outpatient Visits           5 months ago Annual physical exam   [facility] Joycelyn Man M, PA-C   10 months ago Essential hypertension   [facility] [place], [place], PA-C   1 year ago Type 2 diabetes mellitus without complication, without long-term current use of insulin [facility])   [facility] [place], [place], [place]   2 years ago Type 2 diabetes mellitus without complication, without long-term current use of insulin [facility])   [facility] [place], [place], [place]   2 years ago Type 2 diabetes mellitus without complication, without long-term current use of insulin [facility])   [facility], Alessandra [place], [place]       Future Appointments             In 1 week Bacigalupo, Marzella Schlein, MD [facility], [facility]   In 2 months Kirke Corin, Chelsea Aus, MD [facility], [facility]

## 2020-08-12 NOTE — Telephone Encounter (Signed)
Please review for Dr. B ° ° °Thanks,  ° °-Avid Guillette  °

## 2020-08-20 ENCOUNTER — Other Ambulatory Visit: Payer: Self-pay | Admitting: Cardiovascular Disease

## 2020-08-20 DIAGNOSIS — I739 Peripheral vascular disease, unspecified: Secondary | ICD-10-CM

## 2020-08-21 ENCOUNTER — Other Ambulatory Visit: Payer: Self-pay

## 2020-08-21 ENCOUNTER — Encounter: Payer: Self-pay | Admitting: Family Medicine

## 2020-08-21 ENCOUNTER — Ambulatory Visit: Payer: BC Managed Care – PPO | Admitting: Family Medicine

## 2020-08-21 VITALS — BP 144/66 | HR 66 | Temp 98.0°F | Resp 16 | Wt 171.3 lb

## 2020-08-21 DIAGNOSIS — D72829 Elevated white blood cell count, unspecified: Secondary | ICD-10-CM

## 2020-08-21 DIAGNOSIS — E1159 Type 2 diabetes mellitus with other circulatory complications: Secondary | ICD-10-CM

## 2020-08-21 DIAGNOSIS — E785 Hyperlipidemia, unspecified: Secondary | ICD-10-CM

## 2020-08-21 DIAGNOSIS — F5104 Psychophysiologic insomnia: Secondary | ICD-10-CM

## 2020-08-21 DIAGNOSIS — E1169 Type 2 diabetes mellitus with other specified complication: Secondary | ICD-10-CM | POA: Diagnosis not present

## 2020-08-21 DIAGNOSIS — F419 Anxiety disorder, unspecified: Secondary | ICD-10-CM

## 2020-08-21 DIAGNOSIS — F411 Generalized anxiety disorder: Secondary | ICD-10-CM

## 2020-08-21 DIAGNOSIS — G4700 Insomnia, unspecified: Secondary | ICD-10-CM | POA: Insufficient documentation

## 2020-08-21 DIAGNOSIS — I152 Hypertension secondary to endocrine disorders: Secondary | ICD-10-CM

## 2020-08-21 DIAGNOSIS — I739 Peripheral vascular disease, unspecified: Secondary | ICD-10-CM

## 2020-08-21 LAB — POCT GLYCOSYLATED HEMOGLOBIN (HGB A1C): Hemoglobin A1C: 5.6 % (ref 4.0–5.6)

## 2020-08-21 MED ORDER — FLUOCINONIDE 0.05 % EX OINT: 1.0000 "application " | TOPICAL_OINTMENT | Freq: Two times a day (BID) | CUTANEOUS | 2 refills | Status: AC

## 2020-08-21 MED ORDER — ALPRAZOLAM 0.25 MG PO TABS: 0.2500 mg | ORAL_TABLET | Freq: Every evening | ORAL | 0 refills | Status: AC | PRN

## 2020-08-21 NOTE — Progress Notes (Signed)
Established patient visit   Patient: Sara Roy   DOB: 1960/10/26   60 y.o. Female  MRN: [id number] Visit Date: 08/21/2020  Today's healthcare provider: Lavon Paganini, MD   Chief Complaint  Patient presents with   Diabetes   Hypertension   Hyperlipidemia   Anxiety   Subjective    HPI   Diabetes Pt. reports that BG at home has been ranging in 100s. She denies neuropathy, polydipsia, polyuria, & vision changes. Foot exam WNL today. Pt. states that she's due for an annual eye exam, but "hasn't had time" to set up an appointment.  HTN Pt. reports that she has been taking amlodipine & HCTZ as prescribed w/o difficulty or side effects.  Denies chest pain today. She states that she has a BP cuff at home, but doesn't like to check her BP because it "stresses her out." Pt. prefers not to start additional medications today.  HLD Stable on atorvastatin; pt. denies side effects. Lipid panel WNL on 03/02/20.  Anxiety Pt. states that she uses of 1 tab alprazolam nightly for anxiety; she reports continued work-related stressors.  Psoriasis Well-controlled on Fluocinonide; pt. requests RF today  Health Maintenance - Colonoscopy UTD (2017) - Screening mammo - can't remember when her last mammo was, but declines referral today; states that she has never had an abnormal mammo - Cervical cancer screening - can't remember when her last pap was, but declines referral today; states that she has never had an abnormal pap  - Needs pneumococcal vaccine    Medications: Outpatient Medications Prior to Visit  Medication Sig   albuterol (VENTOLIN HFA) 108 (90 Base) MCG/ACT inhaler Inhale 2 puffs into the lungs every 6 (six) hours as needed for wheezing or shortness of breath.   amLODipine (NORVASC) 5 MG tablet Take 1 tablet (5 mg total) by mouth in the morning and at bedtime.   aspirin EC 81 MG tablet Take 1 tablet (81 mg total) by mouth daily.   atorvastatin (LIPITOR) 40 MG tablet TAKE  1 TABLET BY MOUTH EVERY DAY   Blood Glucose Monitoring Suppl (ONETOUCH VERIO) w/Device KIT 1 kit by Does not apply route daily before breakfast.   fluticasone (FLONASE) 50 MCG/ACT nasal spray Place 2 sprays into both nostrils daily.   fluticasone furoate-vilanterol (BREO ELLIPTA) 200-25 MCG/INH AEPB Inhale 1 puff into the lungs daily.   hydrochlorothiazide (HYDRODIURIL) 25 MG tablet TAKE 1 TABLET(25 MG) BY MOUTH DAILY   Lancets (ONETOUCH DELICA PLUS [id number]) MISC USE TO CHECK BLOOD SUGAR ONCE DAILY   mometasone (NASONEX) 50 MCG/ACT nasal spray Place 2 sprays into the nose daily.   nystatin (MYCOSTATIN) 100000 UNIT/ML suspension Take 5 mLs (500,000 Units total) by mouth 4 (four) times daily.   ONETOUCH VERIO test strip TEST BLOOD SUGAR ONCE DAILY   potassium chloride (KLOR-CON) 10 MEQ tablet TAKE 1 TABLET BY MOUTH DAILY   [DISCONTINUED] ALPRAZolam (XANAX) 0.25 MG tablet TAKE 1 TABLET(0.25 MG) BY MOUTH THREE TIMES DAILY   No facility-administered medications prior to visit.    Review of Systems  Constitutional:  Negative for appetite change and fever.  HENT: Negative.    Eyes:  Negative for visual disturbance.  Respiratory: Negative.    Cardiovascular: Negative.   Gastrointestinal: Negative.   Endocrine: Negative for polydipsia and polyuria.  Skin: Negative.   All other systems reviewed and are negative.     Objective    BP (!) 144/66   Pulse 66   Temp 98 F (36.7  C) (Oral)   Resp 16   Wt 171 lb 4.8 oz (77.7 kg)   SpO2 96%   BMI 26.83 kg/m     Physical Exam Constitutional:      General: She is not in acute distress.    Appearance: Normal appearance.  HENT:     Head: Normocephalic and atraumatic.     Right Ear: External ear normal.     Left Ear: External ear normal.     Nose: Nose normal.  Eyes:     Conjunctiva/sclera: Conjunctivae normal.  Cardiovascular:     Rate and Rhythm: Normal rate and regular rhythm.     Pulses: Normal pulses.  Pulmonary:     Effort:  Pulmonary effort is normal.     Breath sounds: Normal breath sounds.  Abdominal:     General: Abdomen is flat. Bowel sounds are normal.     Palpations: Abdomen is soft.  Neurological:     Mental Status: She is alert.     Results for orders placed or performed in visit on 08/21/20  POCT glycosylated hemoglobin (Hb A1C)  Result Value Ref Range   Hemoglobin A1C 5.6 4.0 - 5.6 %   HbA1c POC (<> result, manual entry)     HbA1c, POC (prediabetic range)     HbA1c, POC (controlled diabetic range)      Assessment & Plan     1. Type 2 diabetes mellitus with other specified complication, without long-term current use of insulin (HCC) - Well controlled - POCT glycosylated hemoglobin (Hb A1C) - Recommended annual eye exam  2. Hypertension associated with diabetes ([place]) - Sub-optimal control - consider adding additional medication at future visit if pt. is amenable - Continue amlodipine 5 mg BID & HCTZ 25 mg daily - Basic Metabolic Panel  3. Hyperlipidemia associated with type 2 diabetes mellitus ([place]) - Well controlled - Continue atorvastatin 40 mg daily  4. Leukocytosis, unspecified type - Recheck CBC w/Diff/Platelet  5. Anxiety - Stable - Continue ALPRAZolam (XANAX) 0.25 MG at bedtime prn for anxiety.  6. PAD (peripheral artery disease) associated with diabetes (HCC) - Stable - Continue ASA 81 mg daily - Encouraged smoking cessation, but pt. Is uninterested at this time  Return in about 6 months (around 02/21/2021) for CPE, With new PCP.      Percell Locus, MS3   Patient seen along with MS3 student Percell Locus. I personally evaluated this patient along with the student, and verified all aspects of the history, physical exam, and medical decision making as documented by the student. I agree with the student's documentation and have made all necessary edits.  Abisola Carrero, Dionne Bucy, MD, MPH [facility]

## 2020-08-21 NOTE — Assessment & Plan Note (Signed)
BMP today

## 2020-08-21 NOTE — Assessment & Plan Note (Signed)
Continue atorvastatin 40 mg daily.  

## 2020-09-03 ENCOUNTER — Ambulatory Visit (INDEPENDENT_AMBULATORY_CARE_PROVIDER_SITE_OTHER): Payer: BC Managed Care – PPO

## 2020-09-03 ENCOUNTER — Other Ambulatory Visit: Payer: Self-pay

## 2020-09-03 DIAGNOSIS — I739 Peripheral vascular disease, unspecified: Secondary | ICD-10-CM | POA: Diagnosis not present

## 2020-09-03 DIAGNOSIS — Z95820 Peripheral vascular angioplasty status with implants and grafts: Secondary | ICD-10-CM

## 2020-09-08 ENCOUNTER — Other Ambulatory Visit: Payer: Self-pay

## 2020-09-08 DIAGNOSIS — I739 Peripheral vascular disease, unspecified: Secondary | ICD-10-CM

## 2020-10-11 ENCOUNTER — Other Ambulatory Visit: Payer: Self-pay | Admitting: Nurse Practitioner

## 2020-10-11 DIAGNOSIS — I10 Essential (primary) hypertension: Secondary | ICD-10-CM

## 2020-10-14 NOTE — Telephone Encounter (Signed)
This is a [place] pt 

## 2020-10-15 ENCOUNTER — Other Ambulatory Visit: Payer: Self-pay | Admitting: Physician Assistant

## 2020-10-15 DIAGNOSIS — I10 Essential (primary) hypertension: Secondary | ICD-10-CM

## 2020-10-16 ENCOUNTER — Ambulatory Visit: Payer: BC Managed Care – PPO | Admitting: Cardiovascular Disease

## 2020-10-16 ENCOUNTER — Other Ambulatory Visit: Payer: Self-pay

## 2020-10-16 ENCOUNTER — Encounter: Payer: Self-pay | Admitting: Cardiovascular Disease

## 2020-10-16 VITALS — BP 142/60 | HR 63 | Ht 67.0 in | Wt 174.2 lb

## 2020-10-16 DIAGNOSIS — Z720 Tobacco use: Secondary | ICD-10-CM

## 2020-10-16 DIAGNOSIS — E785 Hyperlipidemia, unspecified: Secondary | ICD-10-CM | POA: Diagnosis not present

## 2020-10-16 DIAGNOSIS — I10 Essential (primary) hypertension: Secondary | ICD-10-CM | POA: Diagnosis not present

## 2020-10-16 DIAGNOSIS — R011 Cardiac murmur, unspecified: Secondary | ICD-10-CM | POA: Diagnosis not present

## 2020-10-16 DIAGNOSIS — I739 Peripheral vascular disease, unspecified: Secondary | ICD-10-CM | POA: Diagnosis not present
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
## 2020-10-16 NOTE — Patient Instructions (Signed)
Medication Instructions:  Your physician recommends that you continue on your current medications as directed. Please refer to the Current Medication list given to you today.  *If you need a refill on your cardiac medications before your next appointment, please call your pharmacy*   Lab Work: None ordered  If you have labs (blood work) drawn today and your tests are completely normal, you will receive your results only by: MyChart Message (if you have MyChart) OR A paper copy in the mail If you have any lab test that is abnormal or we need to change your treatment, we will call you to review the results.   Testing/Procedures: Your physician has requested that you have an echocardiogram. Echocardiography is a painless test that uses sound waves to create images of your heart. It provides your doctor with information about the size and shape of your heart and how well your heart's chambers and valves are working. This procedure takes approximately one hour. There are no restrictions for this procedure.   Your physician has requested that you have an ankle brachial index (ABI). During this test an ultrasound and blood pressure cuff are used to evaluate the arteries that supply the arms and legs with blood. Allow thirty minutes for this exam. There are no restrictions or special instructions.  Your physician has requested that you have a Aorta/IVC/Iliac duplex    Follow-Up: At [facility], you and your health needs are our priority.  As part of our continuing mission to provide you with exceptional heart care, we have created designated Provider Care Teams.  These Care Teams include your primary Cardiologist (physician) and Advanced Practice Providers (APPs -  Physician Assistants and Nurse Practitioners) who all work together to provide you with the care you need, when you need it.  We recommend signing up for the patient portal called "MyChart".  Sign up information is provided on this  After Visit Summary.  MyChart is used to connect with patients for Virtual Visits (Telemedicine).  Patients are able to view lab/test results, encounter notes, upcoming appointments, etc.  Non-urgent messages can be sent to your provider as well.   To learn more about what you can do with MyChart, go to [email or web address].    Your physician wants you to follow-up in: 1 year You will receive a reminder letter in the mail two months in advance. If you don't receive a letter, please call our office to schedule the follow-up appointment.   The format for your next appointment:   In Person  Provider:   You may see Lorine Bears, MD or one of the following Advanced Practice Providers on your designated Care Team:   Nicolasa Ducking, NP Eula Listen, PA-C Marisue Ivan, PA-C Cadence Fransico Michael, [place]   Other Instructions N/A

## 2020-10-31 ENCOUNTER — Other Ambulatory Visit: Payer: Self-pay | Admitting: Family Medicine

## 2020-10-31 ENCOUNTER — Telehealth: Payer: Self-pay

## 2020-10-31 DIAGNOSIS — F419 Anxiety disorder, unspecified: Secondary | ICD-10-CM

## 2020-10-31 NOTE — Telephone Encounter (Signed)
Patient reports that her prescription is suppose to be for Xanax 1 tablet 3 times a day as needed. Please advise.

## 2020-10-31 NOTE — Telephone Encounter (Signed)
Please review request for medication last filled 08/21/20, last office visit to address anxiety 08/21/20. KW

## 2020-10-31 NOTE — Telephone Encounter (Signed)
[facility] faxed refill request for the following medications:  ALPRAZolam (XANAX) 0.25 MG tablet  Please advise.  

## 2020-11-03 NOTE — Telephone Encounter (Signed)
This was discussed with patient. She even reported only taking it qhs, not TID. This new Rx is appropriate. As discussed with patient above, she would need a new appt to discuss any dose changes

## 2020-11-03 NOTE — Telephone Encounter (Signed)
FYI to PCP

## 2020-11-03 NOTE — Telephone Encounter (Signed)
Patient was advised and states that it is the same refill she has always requested she states that she does not know why she is having to return back to office she reports that she takes two at night and on occasion she will take one during the day. Patient stated that she was very upset by this news to arrange appt to discuss and states that she refuses to come in and see Robynn Pane " this is ridiculous I just wont take it then, I guess I dont need it at night tell her she wont have to worry about seeing me again." Patient hung up phone after I advised her that I will let the provider know. KW

## 2020-11-07 ENCOUNTER — Other Ambulatory Visit: Payer: Self-pay | Admitting: Cardiovascular Disease

## 2020-11-10 ENCOUNTER — Other Ambulatory Visit: Payer: Self-pay | Admitting: Nurse Practitioner

## 2020-11-10 DIAGNOSIS — I10 Essential (primary) hypertension: Secondary | ICD-10-CM

## 2020-11-13 DIAGNOSIS — E119 Type 2 diabetes mellitus without complications: Secondary | ICD-10-CM | POA: Diagnosis not present

## 2020-11-13 DIAGNOSIS — R011 Cardiac murmur, unspecified: Secondary | ICD-10-CM | POA: Diagnosis not present

## 2020-11-13 DIAGNOSIS — I10 Essential (primary) hypertension: Secondary | ICD-10-CM | POA: Diagnosis not present

## 2020-11-13 DIAGNOSIS — E538 Deficiency of other specified B group vitamins: Secondary | ICD-10-CM | POA: Diagnosis not present

## 2020-11-13 DIAGNOSIS — F17210 Nicotine dependence, cigarettes, uncomplicated: Secondary | ICD-10-CM | POA: Diagnosis not present

## 2020-11-13 DIAGNOSIS — E039 Hypothyroidism, unspecified: Secondary | ICD-10-CM | POA: Diagnosis not present

## 2020-11-13 DIAGNOSIS — E785 Hyperlipidemia, unspecified: Secondary | ICD-10-CM | POA: Diagnosis not present

## 2020-11-13 DIAGNOSIS — Z716 Tobacco abuse counseling: Secondary | ICD-10-CM | POA: Diagnosis not present

## 2020-11-13 DIAGNOSIS — D649 Anemia, unspecified: Secondary | ICD-10-CM | POA: Diagnosis not present

## 2020-11-13 DIAGNOSIS — Z122 Encounter for screening for malignant neoplasm of respiratory organs: Secondary | ICD-10-CM | POA: Diagnosis not present

## 2020-11-13 DIAGNOSIS — E559 Vitamin D deficiency, unspecified: Secondary | ICD-10-CM | POA: Diagnosis not present

## 2020-11-18 ENCOUNTER — Ambulatory Visit (INDEPENDENT_AMBULATORY_CARE_PROVIDER_SITE_OTHER): Payer: BC Managed Care – PPO

## 2020-11-18 ENCOUNTER — Other Ambulatory Visit: Payer: Self-pay

## 2020-11-18 DIAGNOSIS — R011 Cardiac murmur, unspecified: Secondary | ICD-10-CM

## 2020-11-19 LAB — ECHOCARDIOGRAM COMPLETE
AR max vel: 1.71 cm2
AV Area VTI: 1.74 cm2
AV Area mean vel: 1.7 cm2
AV Mean grad: 10.6 mmHg
AV Peak grad: 19.4 mmHg
Ao pk vel: 2.2 m/s
Area-P 1/2: 3.72 cm2
Calc EF: 66.3 %
S' Lateral: 2.9 cm
Single Plane A2C EF: 61.1 %
Single Plane A4C EF: 69.5 %

## 2021-01-18 ENCOUNTER — Other Ambulatory Visit: Payer: Self-pay | Admitting: Family Medicine

## 2021-01-18 DIAGNOSIS — I10 Essential (primary) hypertension: Secondary | ICD-10-CM

## 2021-01-18 NOTE — Telephone Encounter (Signed)
last RF 10/15/20 #90 1 RF should have enough until March 2023  Requested Prescriptions  Refused Prescriptions Disp Refills  . hydrochlorothiazide (HYDRODIURIL) 25 MG tablet [Pharmacy Med Name: HYDROCHLOROTHIAZIDE 25MG  TABLETS] 90 tablet 1    Sig: TAKE 1 TABLET(25 MG) BY MOUTH DAILY     Cardiovascular: Diuretics - Thiazide Failed - 01/18/2021  8:00 AM      Failed - Last BP in normal range    BP Readings from Last 1 Encounters:  10/16/20 (!) 142/60         Passed - Ca in normal range and within 360 days    Calcium  Date Value Ref Range Status  03/03/2020 9.9 8.7 - 10.3 mg/dL Final   Calcium, Total  Date Value Ref Range Status  07/22/2011 9.7 8.5 - 10.1 mg/dL Final         Passed - Cr in normal range and within 360 days    Creatinine  Date Value Ref Range Status  07/22/2011 0.78 0.60 - 1.30 mg/dL Final   Creatinine, Ser  Date Value Ref Range Status  03/03/2020 0.83 0.57 - 1.00 mg/dL Final         Passed - K in normal range and within 360 days    Potassium  Date Value Ref Range Status  03/03/2020 4.0 3.5 - 5.2 mmol/L Final  07/22/2011 3.7 3.5 - 5.1 mmol/L Final         Passed - Na in normal range and within 360 days    Sodium  Date Value Ref Range Status  03/03/2020 141 134 - 144 mmol/L Final  07/22/2011 141 136 - 145 mmol/L Final         Passed - Valid encounter within last 6 months    Recent Outpatient Visits          5 months ago Type 2 diabetes mellitus with other specified complication, without long-term current use of insulin [facility])   [facility] [place], [place], MD   10 months ago Annual physical exam   [facility], [place], Alessandra Bevels   1 year ago Essential hypertension   [facility], [place], [place]   2 years ago Type 2 diabetes mellitus without complication, without long-term current use of insulin [facility])   [facility], [place], [place]   2 years ago Type 2 diabetes  mellitus without complication, without long-term current use of insulin [facility])   [facility] [place], [place], [place]      Future Appointments            In 1 month [place], Suzie Portela, FNP [facility], [facility]

## 2021-01-23 DIAGNOSIS — B078 Other viral warts: Secondary | ICD-10-CM | POA: Diagnosis not present

## 2021-01-23 DIAGNOSIS — D485 Neoplasm of uncertain behavior of skin: Secondary | ICD-10-CM | POA: Diagnosis not present

## 2021-01-29 ENCOUNTER — Other Ambulatory Visit: Payer: Self-pay | Admitting: *Deleted

## 2021-01-29 DIAGNOSIS — Z87891 Personal history of nicotine dependence: Secondary | ICD-10-CM

## 2021-01-29 DIAGNOSIS — F17210 Nicotine dependence, cigarettes, uncomplicated: Secondary | ICD-10-CM

## 2021-02-16 DIAGNOSIS — E785 Hyperlipidemia, unspecified: Secondary | ICD-10-CM | POA: Diagnosis not present

## 2021-02-16 DIAGNOSIS — E559 Vitamin D deficiency, unspecified: Secondary | ICD-10-CM | POA: Diagnosis not present

## 2021-02-16 DIAGNOSIS — E119 Type 2 diabetes mellitus without complications: Secondary | ICD-10-CM | POA: Diagnosis not present

## 2021-02-16 DIAGNOSIS — F411 Generalized anxiety disorder: Secondary | ICD-10-CM | POA: Diagnosis not present

## 2021-02-16 DIAGNOSIS — D519 Vitamin B12 deficiency anemia, unspecified: Secondary | ICD-10-CM | POA: Diagnosis not present

## 2021-02-16 DIAGNOSIS — Z79899 Other long term (current) drug therapy: Secondary | ICD-10-CM | POA: Diagnosis not present

## 2021-02-16 DIAGNOSIS — E039 Hypothyroidism, unspecified: Secondary | ICD-10-CM | POA: Diagnosis not present

## 2021-02-16 DIAGNOSIS — E7800 Pure hypercholesterolemia, unspecified: Secondary | ICD-10-CM | POA: Diagnosis not present

## 2021-02-16 DIAGNOSIS — I10 Essential (primary) hypertension: Secondary | ICD-10-CM | POA: Diagnosis not present

## 2021-02-16 DIAGNOSIS — R5383 Other fatigue: Secondary | ICD-10-CM | POA: Diagnosis not present

## 2021-02-20 ENCOUNTER — Encounter: Payer: Self-pay | Admitting: Acute Care

## 2021-02-20 ENCOUNTER — Ambulatory Visit
Admission: RE | Admit: 2021-02-20 | Discharge: 2021-02-20 | Disposition: A | Discharge status: Home / Self Care | Payer: BC Managed Care – PPO | Source: Ambulatory Visit | Attending: Acute Care | Admitting: Acute Care

## 2021-02-20 ENCOUNTER — Telehealth (INDEPENDENT_AMBULATORY_CARE_PROVIDER_SITE_OTHER): Payer: BC Managed Care – PPO | Admitting: Acute Care

## 2021-02-20 ENCOUNTER — Other Ambulatory Visit: Payer: Self-pay

## 2021-02-20 DIAGNOSIS — F17210 Nicotine dependence, cigarettes, uncomplicated: Secondary | ICD-10-CM

## 2021-02-20 DIAGNOSIS — Z87891 Personal history of nicotine dependence: Secondary | ICD-10-CM | POA: Insufficient documentation

## 2021-02-20 NOTE — Patient Instructions (Signed)
Thank you for participating in the [place] [facility] Scre[facility]. °It was our pleasure to meet you today. °We will call you with the results of your scan within the next few days. °Your scan will be assigned a Lung RADS category score by the physicians reading the scans.  °This Lung RADS score determines follow up scanning.  °See below for description of categories, and follow up screening recommendations. °We will be in touch to schedule your follow up screening annually or based on recommendations of our providers. °We will fax a copy of your scan results to your Primary Care Physician, or the physician who referred you to the program, to ensure they have the results. °Please call the office if you have any questions or concerns regarding your scanning experience or results.  °Our office number is [phone]. °Please speak with Denise Phelps, RN. She is our Lung Cancer Screening RN. °If she is unavailable when you call, please have the office staff send her a message. She will return your call at her earliest convenience. °Remember, if your scan is normal, we will scan you annually as long as you continue to meet the criteria for the program. (Age 55-77, Current smoker or smoker who has quit within the last 15 years). °If you are a smoker, remember, quitting is the single most powerful action that you can take to decrease your risk of lung cancer and other pulmonary, breathing related problems. °We know quitting is hard, and we are here to help.  °Please let us know if there is anything we can do to help you meet your goal of quitting. °If you are a former smoker, congratulations. We are proud of you! Remain smoke free! °Remember you can refer friends or family members through the number above.  °We will screen them to make sure they meet criteria for the program. °Thank you for helping us take better care of you by participating in Lung Screening. ° °You can receive free nicotine replacement therapy  ( patches, gum or mints) by calling 1-800-QUIT NOW. Please call so we can get you on the path to becoming  a non-smoker. I know it is hard, but you can do this! ° °Lung RADS Categories: ° °Lung RADS 1: no nodules or definitely non-concerning nodules.  °Recommendation is for a repeat annual scan in 12 months. ° °Lung RADS 2:  nodules that are non-concerning in appearance and behavior with a very low likelihood of becoming an active cancer. °Recommendation is for a repeat annual scan in 12 months. ° °Lung RADS 3: nodules that are probably non-concerning , includes nodules with a low likelihood of becoming an active cancer.  Recommendation is for a 6-month repeat screening scan. Often noted after an upper respiratory illness. We will be in touch to make sure you have no questions, and to schedule your 6-month scan. ° °Lung RADS 4 A: nodules with concerning findings, recommendation is most often for a follow up scan in 3 months or additional testing based on our provider's assessment of the scan. We will be in touch to make sure you have no questions and to schedule the recommended 3 month follow up scan. ° °Lung RADS 4 B:  indicates findings that are concerning. We will be in touch with you to schedule additional diagnostic testing based on our provider's  assessment of the scan. ° °Hypnosis for smoking cessation  °[employer] °[phone] ° °Acupuncture for smoking cessation  °[facility] °[phone]  °

## 2021-02-20 NOTE — Progress Notes (Signed)
Virtual Visit via Video Note  I connected with Sung AmabileSara E Start on 02/20/21 at  9:30 AM EST by a video enabled telemedicine application and verified that I am speaking with the correct person using two identifiers.  Location: Patient: At home Provider: [id number], [place], [place], [address]    I discussed the limitations of evaluation and management by telemedicine and the availability of in person appointments. The patient expressed understanding and agreed to proceed.    Shared Decision Making Visit Lung Cancer Screening Program [phone]([id number])   Eligibility: Age 61 y.o. Pack Years Smoking History Calculation 70 pack year smoking history (# packs/per year x # years smoked) Recent History of coughing up blood  no Unexplained weight loss? no ( >Than 15 pounds within the last 6 months ) Prior History Lung / other cancer no (Diagnosis within the last 5 years already requiring surveillance chest CT Scans). Smoking Status Current Smoker Former Smokers: Years since quit: NA  Quit Date: NA  Visit Components: Discussion included one or more decision making aids. yes Discussion included risk/benefits of screening. yes Discussion included potential follow up diagnostic testing for abnormal scans. yes Discussion included meaning and risk of over diagnosis. yes Discussion included meaning and risk of False Positives. yes Discussion included meaning of total radiation exposure. yes  Counseling Included: Importance of adherence to annual lung cancer LDCT screening. yes Impact of comorbidities on ability to participate in the program. yes Ability and willingness to under diagnostic treatment. yes  Smoking Cessation Counseling: Current Smokers:  Discussed importance of smoking cessation. yes Information about tobacco cessation classes and interventions provided to patient. yes Patient provided with "ticket" for LDCT Scan. yes Symptomatic Patient. no  Counseling NA Diagnosis Code:  Tobacco Use Z72.0 Asymptomatic Patient yes  Counseling (Intermediate counseling: > three minutes counseling) [id number] Former Smokers:  Discussed the importance of maintaining cigarette abstinence. yes Diagnosis Code: Personal History of Nicotine Dependence. A21.308Z87.891 Information about tobacco cessation classes and interventions provided to patient. Yes Patient provided with "ticket" for LDCT Scan. yes Written Order for Lung Cancer Screening with LDCT placed in Epic. Yes (CT Chest Lung Cancer Screening Low Dose W/O CM) [id number] Z12.2-Screening of respiratory organs Z87.891-Personal history of nicotine dependence  I have spent 25 minutes of face to face/ virtual visit   time with Ms. Mares discussing the risks and benefits of lung cancer screening. We viewed / discussed a power point together that explained in detail the above noted topics. We paused at intervals to allow for questions to be asked and answered to ensure understanding.We discussed that the single most powerful action that she can take to decrease her risk of developing lung cancer is to quit smoking. We discussed whether or not she is ready to commit to setting a quit date. We discussed options for tools to aid in quitting smoking including nicotine replacement therapy, non-nicotine medications, support groups, Quit Smart classes, and behavior modification. We discussed that often times setting smaller, more achievable goals, such as eliminating 1 cigarette a day for a week and then 2 cigarettes a day for a week can be helpful in slowly decreasing the number of cigarettes smoked. This allows for a sense of accomplishment as well as providing a clinical benefit. I provided  her  with smoking cessation  information  with contact information for community resources, classes, free nicotine replacement therapy, and access to mobile apps, text messaging, and on-line smoking cessation help. I have also provided  her  the office  contact information in  the event she needs to contact me, or the screening staff. We discussed the time and location of the scan, and that either Doroteo Glassman RN, Joella Prince, RN  or I will call / send a letter with the results within 24-72 hours of receiving them. The patient verbalized understanding of all of  the above and had no further questions upon leaving the office. They have my contact information in the event they have any further questions.  I spent 3-4 minutes counseling on smoking cessation and the health risks of continued tobacco abuse.  I explained to the patient that there has been a high incidence of coronary artery disease noted on these exams. I explained that this is a non-gated exam therefore degree or severity cannot be determined. This patient is on statin therapy. I have asked the patient to follow-up with their PCP regarding any incidental finding of coronary artery disease and management with diet or medication as their PCP  feels is clinically indicated. The patient verbalized understanding of the above and had no further questions upon completion of the visit.      Magdalen Spatz, NP 02/20/2021

## 2021-02-23 ENCOUNTER — Other Ambulatory Visit: Payer: Self-pay | Admitting: Acute Care

## 2021-02-23 DIAGNOSIS — Z87891 Personal history of nicotine dependence: Secondary | ICD-10-CM

## 2021-02-23 DIAGNOSIS — F17210 Nicotine dependence, cigarettes, uncomplicated: Secondary | ICD-10-CM

## 2021-02-24 ENCOUNTER — Other Ambulatory Visit: Payer: Self-pay | Admitting: Family Medicine

## 2021-02-26 ENCOUNTER — Other Ambulatory Visit: Payer: Self-pay | Admitting: Family Medicine

## 2021-02-26 MED ORDER — ROSUVASTATIN CALCIUM 20 MG PO TABS: 20.0000 mg | ORAL_TABLET | Freq: Every day | ORAL | 3 refills | Status: DC

## 2021-03-04 ENCOUNTER — Encounter: Payer: Self-pay | Admitting: Family Medicine

## 2021-03-31 DIAGNOSIS — I491 Atrial premature depolarization: Secondary | ICD-10-CM | POA: Diagnosis not present

## 2021-03-31 DIAGNOSIS — I517 Cardiomegaly: Secondary | ICD-10-CM | POA: Diagnosis not present

## 2021-03-31 DIAGNOSIS — E785 Hyperlipidemia, unspecified: Secondary | ICD-10-CM | POA: Diagnosis not present

## 2021-03-31 DIAGNOSIS — I6523 Occlusion and stenosis of bilateral carotid arteries: Secondary | ICD-10-CM | POA: Diagnosis not present

## 2021-03-31 DIAGNOSIS — I10 Essential (primary) hypertension: Secondary | ICD-10-CM | POA: Diagnosis not present

## 2021-03-31 DIAGNOSIS — R011 Cardiac murmur, unspecified: Secondary | ICD-10-CM | POA: Diagnosis not present

## 2021-05-20 DIAGNOSIS — I10 Essential (primary) hypertension: Secondary | ICD-10-CM | POA: Diagnosis not present

## 2021-05-20 DIAGNOSIS — E7800 Pure hypercholesterolemia, unspecified: Secondary | ICD-10-CM | POA: Diagnosis not present

## 2021-05-20 DIAGNOSIS — R5383 Other fatigue: Secondary | ICD-10-CM | POA: Diagnosis not present

## 2021-05-20 DIAGNOSIS — E039 Hypothyroidism, unspecified: Secondary | ICD-10-CM | POA: Diagnosis not present

## 2021-05-20 DIAGNOSIS — E559 Vitamin D deficiency, unspecified: Secondary | ICD-10-CM | POA: Diagnosis not present

## 2021-05-20 DIAGNOSIS — E538 Deficiency of other specified B group vitamins: Secondary | ICD-10-CM | POA: Diagnosis not present

## 2021-05-20 DIAGNOSIS — Z79899 Other long term (current) drug therapy: Secondary | ICD-10-CM | POA: Diagnosis not present

## 2021-05-20 DIAGNOSIS — E785 Hyperlipidemia, unspecified: Secondary | ICD-10-CM | POA: Diagnosis not present

## 2021-05-20 DIAGNOSIS — D519 Vitamin B12 deficiency anemia, unspecified: Secondary | ICD-10-CM | POA: Diagnosis not present

## 2021-05-20 DIAGNOSIS — F411 Generalized anxiety disorder: Secondary | ICD-10-CM | POA: Diagnosis not present

## 2021-05-20 DIAGNOSIS — E119 Type 2 diabetes mellitus without complications: Secondary | ICD-10-CM | POA: Diagnosis not present

## 2021-07-10 ENCOUNTER — Other Ambulatory Visit: Payer: Self-pay | Admitting: Nurse Practitioner

## 2021-07-10 DIAGNOSIS — I10 Essential (primary) hypertension: Secondary | ICD-10-CM

## 2021-08-19 DIAGNOSIS — I10 Essential (primary) hypertension: Secondary | ICD-10-CM | POA: Diagnosis not present

## 2021-08-19 DIAGNOSIS — Z79899 Other long term (current) drug therapy: Secondary | ICD-10-CM | POA: Diagnosis not present

## 2021-08-19 DIAGNOSIS — E785 Hyperlipidemia, unspecified: Secondary | ICD-10-CM | POA: Diagnosis not present

## 2021-08-19 DIAGNOSIS — F411 Generalized anxiety disorder: Secondary | ICD-10-CM | POA: Diagnosis not present

## 2021-08-19 DIAGNOSIS — E119 Type 2 diabetes mellitus without complications: Secondary | ICD-10-CM | POA: Diagnosis not present

## 2021-09-02 ENCOUNTER — Other Ambulatory Visit: Payer: Self-pay | Admitting: Cardiovascular Disease

## 2021-09-02 DIAGNOSIS — I739 Peripheral vascular disease, unspecified: Secondary | ICD-10-CM

## 2021-09-08 ENCOUNTER — Ambulatory Visit (INDEPENDENT_AMBULATORY_CARE_PROVIDER_SITE_OTHER): Payer: BC Managed Care – PPO

## 2021-09-08 DIAGNOSIS — Z95828 Presence of other vascular implants and grafts: Secondary | ICD-10-CM | POA: Diagnosis not present

## 2021-09-08 DIAGNOSIS — I739 Peripheral vascular disease, unspecified: Secondary | ICD-10-CM

## 2021-09-11 ENCOUNTER — Other Ambulatory Visit: Payer: Self-pay

## 2021-09-11 DIAGNOSIS — I739 Peripheral vascular disease, unspecified: Secondary | ICD-10-CM

## 2021-11-12 ENCOUNTER — Ambulatory Visit: Payer: BC Managed Care – PPO | Attending: Cardiovascular Disease | Admitting: Cardiovascular Disease

## 2021-11-12 ENCOUNTER — Encounter: Payer: Self-pay | Admitting: Cardiovascular Disease

## 2021-11-12 VITALS — BP 148/60 | HR 68 | Ht 67.0 in | Wt 186.1 lb

## 2021-11-12 DIAGNOSIS — Z720 Tobacco use: Secondary | ICD-10-CM

## 2021-11-12 DIAGNOSIS — I10 Essential (primary) hypertension: Secondary | ICD-10-CM | POA: Diagnosis not present

## 2021-11-12 DIAGNOSIS — E785 Hyperlipidemia, unspecified: Secondary | ICD-10-CM | POA: Diagnosis not present

## 2021-11-12 DIAGNOSIS — I739 Peripheral vascular disease, unspecified: Secondary | ICD-10-CM | POA: Diagnosis not present

## 2021-11-12 DIAGNOSIS — R011 Cardiac murmur, unspecified: Secondary | ICD-10-CM

## 2021-11-12 MED ORDER — ATORVASTATIN CALCIUM 40 MG PO TABS: 40.0000 mg | ORAL_TABLET | Freq: Every day | ORAL | 3 refills | Status: AC

## 2021-11-12 NOTE — Progress Notes (Signed)
Cardiology Office Note   Date:  11/12/2021   ID:  Sara Roy, DOB November 18, 1960, MRN [id number]  PCP:  Remi Haggard, FNP  Cardiologist:   Kathlyn Sacramento, MD   Chief Complaint  Patient presents with   Other    12 month fu no complaints today. Meds reviewed verbally with pt.       History of Present Illness: Sara Roy is a 61 y.o. female who presents for  a follow up visit regarding peripheral arterial disease. She has no previous cardiac history. She has chronic medical conditions that include hypertension, hyperlipidemia and prolonged tobacco use. She smokes one half pack per day and has been doing so for more than 40 years. She is s/p bilateral kissing stent placement to both common iliac arteries into the distal aorta in September 2015 for severe claudication.   She was diagnosed with type 2 diabetes in 2019 with blood sugar greater than 600.  She was started on medications but she worked on improving her diet and was able to lose 80 pounds.  She was taken off diabetes medications.  Hemoglobin A1c continues to be normal.  She is known to have cardiac murmur due to aortic sclerosis.  Most recent echocardiogram in October 2022 showed normal LV systolic function with mildly calcified aortic valve without significant stenosis.  Most recent lower extremity Doppler studies showed normal ABI bilaterally.  Duplex showed patent iliac stents but there was moderately elevated velocity especially on the right side.  She has been doing well with no chest pain or worsening dyspnea.  She denies leg claudication.  Unfortunately, she continues to smoke.   Past Medical History:  Diagnosis Date   Allergy    Anemia    Anxiety    Diastolic dysfunction    a. 07/2014 Echo: EF 60-65%, no rwma, Gr1 DD. Mildly dil LA. Nl RV fxn.   Family history of early CAD    Heart murmur    History of stress test    a. 07/2014 Myoview: EF 55-65%. Small, mod apical defect w/o apical wma->likely apical  thinning (vs apical infarct). No ischemia-->Low Risk   HOH (hard of hearing)    reads lips wears hearing aids   Hyperlipidemia    Hypertension    Peripheral vascular disease ([facility])    a. 10/2013 s/p dist Ao/bilat iliac kissing stents; b. 08/2019 ABI: R 1.05, L 1.07. Stable IVC/Iliac velocities w/ mild inc in LCIA and bilat Ext Iliacs.   Substance abuse ([place])    Quit drinking alcohol 6 years ago   Tobacco abuse     Past Surgical History:  Procedure Laterality Date   ABDOMINAL AORTAGRAM N/A 10/10/2013   Procedure: ABDOMINAL AORTAGRAM;  Surgeon: Wellington Hampshire, MD;  Location: [facility];  Service: Cardiovascular;  Laterality: N/A;   ABDOMINAL HYSTERECTOMY     CHOLECYSTECTOMY     COLONOSCOPY WITH PROPOFOL N/A 10/16/2015   Procedure: COLONOSCOPY WITH PROPOFOL;  Surgeon: Lucilla Lame, MD;  Location: [facility];  Service: Endoscopy;  Laterality: N/A;   LOWER EXTREMITY ANGIOGRAM Bilateral 10/10/2013   ILIAC   BILATERAL   BY DR ARDA   POLYPECTOMY  10/16/2015   Procedure: POLYPECTOMY;  Surgeon: Lucilla Lame, MD;  Location: [facility];  Service: Endoscopy;;   SKIN CANCER EXCISION       Current Outpatient Medications  Medication Sig Dispense Refill   albuterol (VENTOLIN HFA) 108 (90 Base) MCG/ACT inhaler Inhale 2 puffs into the lungs every 6 (  six) hours as needed for wheezing or shortness of breath. 18 g 4   ALPRAZolam (XANAX) 0.25 MG tablet Take 1 tablet (0.25 mg total) by mouth at bedtime as needed for anxiety. 90 tablet 0   amLODipine (NORVASC) 5 MG tablet TAKE 1 TABLET(5 MG) BY MOUTH IN THE MORNING AND AT BEDTIME 180 tablet 0   aspirin EC 81 MG tablet Take 1 tablet (81 mg total) by mouth daily. 90 tablet 3   atorvastatin (LIPITOR) 40 MG tablet Take 1 tablet (40 mg total) by mouth daily. 90 tablet 3   Blood Glucose Monitoring Suppl (ONETOUCH VERIO) w/Device KIT 1 kit by Does not apply route daily before breakfast. 1 kit 0   fluocinonide ointment (LIDEX) 9.56 % Apply 1 application  topically 2 (two) times daily. 60 g 2   fluticasone (FLONASE) 50 MCG/ACT nasal spray Place 2 sprays into both nostrils daily. 48 g 3   fluticasone furoate-vilanterol (BREO ELLIPTA) 200-25 MCG/INH AEPB Inhale 1 puff into the lungs daily. 60 each 5   hydrochlorothiazide (HYDRODIURIL) 25 MG tablet TAKE 1 TABLET(25 MG) BY MOUTH DAILY 90 tablet 1   Lancets (ONETOUCH DELICA PLUS [id number]) MISC USE TO CHECK BLOOD SUGAR ONCE DAILY 100 each 12   mometasone (NASONEX) 50 MCG/ACT nasal spray Place 2 sprays into the nose daily. 17 g 12   nystatin (MYCOSTATIN) 100000 UNIT/ML suspension Take 5 mLs (500,000 Units total) by mouth 4 (four) times daily. 60 mL 0   ONETOUCH VERIO test strip TEST BLOOD SUGAR ONCE DAILY 100 strip 12   potassium chloride (KLOR-CON) 10 MEQ tablet TAKE 1 TABLET BY MOUTH DAILY 90 tablet 1   No current facility-administered medications for this visit.    Allergies:   Lisinopril, Penicillins, and Losartan    Social History:  The patient  reports that she has been smoking cigarettes. She has a 60.00 pack-year smoking history. She has never used smokeless tobacco. She reports that she does not currently use drugs after having used the following drugs: Marijuana. She reports that she does not drink alcohol.   Family History:  The patient's family history includes Cancer in her mother; Heart disease in her father; Hemachromatosis in an other family member; Hodgkin's lymphoma in her cousin.    ROS:  Please see the history of present illness.   Otherwise, review of systems are positive for none.   All other systems are reviewed and negative.    PHYSICAL EXAM: VS:  BP (!) 148/60 (BP Location: Left Arm, Patient Position: Sitting, Cuff Size: Normal)   Pulse 68   Ht $R'5\' 7"'Iv$  (1.702 m)   Wt 186 lb 2 oz (84.4 kg)   SpO2 97%   BMI 29.15 kg/m  , BMI Body mass index is 29.15 kg/m. GEN: Well nourished, well developed, in no acute distress  HEENT: normal  Neck: no JVD, carotid bruits, or  masses Cardiac: RRR; no rubs, or gallops,no edema . 2/6 crescendo decrescendo systolic murmur in the aortic area which is early peaking. Respiratory:  clear to auscultation bilaterally, normal work of breathing GI: soft, nontender, nondistended, + BS MS: no deformity or atrophy  Skin: warm and dry, no rash Neuro:  Strength and sensation are intact Psych: euthymic mood, full affect Distal pedal pulses are palpable bilaterally.   EKG:  EKG is  ordered today. EKG showed normal sinus rhythm with no significant ST or T wave changes.  Recent Labs: No results found for requested labs within last 365 days.    Lipid  Panel    Component Value Date/Time   CHOL 135 03/03/2020 1351   TRIG 132 03/03/2020 1351   HDL 49 03/03/2020 1351   CHOLHDL 2.5 12/20/2018 1136   CHOLHDL 4 03/10/2015 1128   VLDL 37.4 03/10/2015 1128   LDLCALC 63 03/03/2020 1351   LDLDIRECT 162.7 08/18/2011 1007      Wt Readings from Last 3 Encounters:  11/12/21 186 lb 2 oz (84.4 kg)  02/20/21 174 lb (78.9 kg)  10/16/20 174 lb 4 oz (79 kg)        ASSESSMENT AND PLAN:  1.  Peripheral arterial disease: Status post bilateral common iliac artery stent placement .  Most recent Doppler showed normal ABI and patent iliac stents with moderately elevated velocities.  Repeat studies in August of next year.  Currently with no claudication.    2. Essential hypertension: Blood pressure is reasonably controlled on current medications.  3. Hyperlipidemia: She stopped taking atorvastatin for unclear reasons.  She did not have any side effects with it and her lipid profile was optimal on that medication.  Thus, I elected to resume atorvastatin at 40 mg daily.  4. Tobacco use: I again discussed with her the importance of smoking cessation.   5.  Cardiac murmur: Most recent echocardiogram showed aortic sclerosis with no significant stenosis.   Disposition:   FU with me in  12 months  Signed,  Kathlyn Sacramento, MD  11/12/2021  4:05 PM    [facility]

## 2021-11-12 NOTE — Patient Instructions (Signed)
Medication Instructions:  Your physician has recommended you make the following change in your medication:   RESUME Atorvastatin 40 mg daily. An Rx has been sent to your pharmacy.  *If you need a refill on your cardiac medications before your next appointment, please call your pharmacy*   Lab Work: None ordered If you have labs (blood work) drawn today and your tests are completely normal, you will receive your results only by: [place] (if you have MyChart) OR A paper copy in the mail If you have any lab test that is abnormal or we need to change your treatment, we will call you to review the results.   Testing/Procedures: None ordered   Follow-Up: At [facility], you and your health needs are our priority.  As part of our continuing mission to provide you with exceptional heart care, we have created designated Provider Care Teams.  These Care Teams include your primary Cardiologist (physician) and Advanced Practice Providers (APPs -  Physician Assistants and Nurse Practitioners) who all work together to provide you with the care you need, when you need it.  We recommend signing up for the patient portal called "MyChart".  Sign up information is provided on this After Visit Summary.  MyChart is used to connect with patients for Virtual Visits (Telemedicine).  Patients are able to view lab/test results, encounter notes, upcoming appointments, etc.  Non-urgent messages can be sent to your provider as well.   To learn more about what you can do with MyChart, go to [email or web address].    Your next appointment:   Your physician wants you to follow-up in: 1 year You will receive a reminder letter in the mail two months in advance. If you don't receive a letter, please call our office to schedule the follow-up appointment.   The format for your next appointment:   In Person  Provider:   Kathlyn Sacramento, MD     Other Instructions  Steps to Quit Smoking Smoking  tobacco is the leading cause of preventable death. It can affect almost every organ in the body. Smoking puts you and people around you at risk for many serious, long-lasting (chronic) diseases. Quitting smoking can be hard, but it is one of the best things that you can do for your health. It is never too late to quit. Do not give up if you cannot quit the first time. Some people need to try many times to quit. Do your best to stick to your quit plan, and talk with your doctor if you have any questions or concerns. How do I get ready to quit? Pick a date to quit. Set a date within the next 2 weeks to give you time to prepare. Write down the reasons why you are quitting. Keep this list in places where you will see it often. Tell your family, friends, and co-workers that you are quitting. Their support is important. Talk with your doctor about the choices that may help you quit. Find out if your health insurance will pay for these treatments. Know the people, places, things, and activities that make you want to smoke (triggers). Avoid them. What first steps can I take to quit smoking? Throw away all cigarettes at home, at work, and in your car. Throw away the things that you use when you smoke, such as ashtrays and lighters. Clean your car. Empty the ashtray. Clean your home, including curtains and carpets. What can I do to help me quit smoking? Talk with your doctor  about taking medicines and seeing a counselor. You are more likely to succeed when you do both. If you are pregnant or breastfeeding: Talk with your doctor about counseling or other ways to quit smoking. Do not take medicine to help you quit smoking unless your doctor tells you to. Quit right away Quit smoking completely, instead of slowly cutting back on how much you smoke over a period of time. Stopping smoking right away may be more successful than slowly quitting. Go to counseling. In-person is best if this is an option. You are  more likely to quit if you go to counseling sessions regularly. Take medicine You may take medicines to help you quit. Some medicines need a prescription, and some you can buy over-the-counter. Some medicines may contain a drug called nicotine to replace the nicotine in cigarettes. Medicines may: Help you stop having the desire to smoke (cravings). Help to stop the problems that come when you stop smoking (withdrawal symptoms). Your doctor may ask you to use: Nicotine patches, gum, or lozenges. Nicotine inhalers or sprays. Non-nicotine medicine that you take by mouth. Find resources Find resources and other ways to help you quit smoking and remain smoke-free after you quit. They include: Online chats with a Veterinary surgeon. Phone quitlines. Printed Materials engineer. Support groups or group counseling. Text messaging programs. Mobile phone apps. Use apps on your mobile phone or tablet that can help you stick to your quit plan. Examples of free services include Quit Guide from the CDC and smokefree.gov  What can I do to make it easier to quit?  Talk to your family and friends. Ask them to support and encourage you. Call a phone quitline, such as 1-800-QUIT-NOW, reach out to support groups, or work with a Veterinary surgeon. Ask people who smoke to not smoke around you. Avoid places that make you want to smoke, such as: Bars. Parties. Smoke-break areas at work. Spend time with people who do not smoke. Lower the stress in your life. Stress can make you want to smoke. Try these things to lower stress: Getting regular exercise. Doing deep-breathing exercises. Doing yoga. Meditating. What benefits will I see if I quit smoking? Over time, you may have: A better sense of smell and taste. Less coughing and sore throat. A slower heart rate. Lower blood pressure. Clearer skin. Better breathing. Fewer sick days. Summary Quitting smoking can be hard, but it is one of the best things that you can do  for your health. Do not give up if you cannot quit the first time. Some people need to try many times to quit. When you decide to quit smoking, make a plan to help you succeed. Quit smoking right away, not slowly over a period of time. When you start quitting, get help and support to keep you smoke-free. This information is not intended to replace advice given to you by your health care provider. Make sure you discuss any questions you have with your health care provider. Document Revised: 01/16/2021 Document Reviewed: 01/16/2021 Elsevier Patient Education  2023 Elsevier Inc.   Important Information About Sugar

## 2021-11-23 DIAGNOSIS — I517 Cardiomegaly: Secondary | ICD-10-CM | POA: Diagnosis not present

## 2021-11-23 DIAGNOSIS — E559 Vitamin D deficiency, unspecified: Secondary | ICD-10-CM | POA: Diagnosis not present

## 2021-11-23 DIAGNOSIS — R319 Hematuria, unspecified: Secondary | ICD-10-CM | POA: Diagnosis not present

## 2021-11-23 DIAGNOSIS — E785 Hyperlipidemia, unspecified: Secondary | ICD-10-CM | POA: Diagnosis not present

## 2021-11-23 DIAGNOSIS — E119 Type 2 diabetes mellitus without complications: Secondary | ICD-10-CM | POA: Diagnosis not present

## 2021-11-23 DIAGNOSIS — R011 Cardiac murmur, unspecified: Secondary | ICD-10-CM | POA: Diagnosis not present

## 2021-11-23 DIAGNOSIS — E7800 Pure hypercholesterolemia, unspecified: Secondary | ICD-10-CM | POA: Diagnosis not present

## 2021-11-23 DIAGNOSIS — Z79899 Other long term (current) drug therapy: Secondary | ICD-10-CM | POA: Diagnosis not present

## 2021-11-23 DIAGNOSIS — I10 Essential (primary) hypertension: Secondary | ICD-10-CM | POA: Diagnosis not present

## 2021-12-29 ENCOUNTER — Other Ambulatory Visit: Payer: Self-pay | Admitting: Nurse Practitioner

## 2021-12-29 DIAGNOSIS — I10 Essential (primary) hypertension: Secondary | ICD-10-CM

## 2022-02-22 ENCOUNTER — Ambulatory Visit
Admission: RE | Admit: 2022-02-22 | Discharge: 2022-02-22 | Disposition: A | Discharge status: Home / Self Care | Payer: BC Managed Care – PPO | Source: Ambulatory Visit | Attending: Acute Care | Admitting: Acute Care

## 2022-02-22 DIAGNOSIS — F17210 Nicotine dependence, cigarettes, uncomplicated: Secondary | ICD-10-CM | POA: Diagnosis not present

## 2022-02-22 DIAGNOSIS — Z122 Encounter for screening for malignant neoplasm of respiratory organs: Secondary | ICD-10-CM | POA: Diagnosis not present

## 2022-02-22 DIAGNOSIS — I2510 Atherosclerotic heart disease of native coronary artery without angina pectoris: Secondary | ICD-10-CM | POA: Insufficient documentation

## 2022-02-22 DIAGNOSIS — J439 Emphysema, unspecified: Secondary | ICD-10-CM | POA: Diagnosis not present

## 2022-02-22 DIAGNOSIS — Z87891 Personal history of nicotine dependence: Secondary | ICD-10-CM

## 2022-02-22 DIAGNOSIS — I700 Atherosclerosis of aorta: Secondary | ICD-10-CM | POA: Insufficient documentation

## 2022-02-24 ENCOUNTER — Other Ambulatory Visit: Payer: Self-pay

## 2022-02-24 DIAGNOSIS — Z122 Encounter for screening for malignant neoplasm of respiratory organs: Secondary | ICD-10-CM

## 2022-02-24 DIAGNOSIS — Z87891 Personal history of nicotine dependence: Secondary | ICD-10-CM

## 2022-02-24 DIAGNOSIS — F17210 Nicotine dependence, cigarettes, uncomplicated: Secondary | ICD-10-CM

## 2022-08-09 ENCOUNTER — Other Ambulatory Visit: Payer: Self-pay | Admitting: *Deleted

## 2022-08-09 DIAGNOSIS — I739 Peripheral vascular disease, unspecified: Secondary | ICD-10-CM

## 2022-09-29 ENCOUNTER — Ambulatory Visit: Payer: BC Managed Care – PPO

## 2022-09-29 ENCOUNTER — Ambulatory Visit (INDEPENDENT_AMBULATORY_CARE_PROVIDER_SITE_OTHER): Payer: BC Managed Care – PPO

## 2022-09-29 DIAGNOSIS — Z95828 Presence of other vascular implants and grafts: Secondary | ICD-10-CM

## 2022-09-29 DIAGNOSIS — I739 Peripheral vascular disease, unspecified: Secondary | ICD-10-CM

## 2022-09-30 ENCOUNTER — Other Ambulatory Visit: Payer: Self-pay | Admitting: *Deleted

## 2022-09-30 DIAGNOSIS — I739 Peripheral vascular disease, unspecified: Secondary | ICD-10-CM

## 2022-09-30 LAB — VAS US ABI WITH/WO TBI
Left ABI: 1.1
Right ABI: 0.99

## 2022-11-12 ENCOUNTER — Encounter: Payer: Self-pay | Admitting: Nurse Practitioner

## 2022-11-12 ENCOUNTER — Ambulatory Visit: Payer: BC Managed Care – PPO | Attending: Nurse Practitioner | Admitting: Nurse Practitioner

## 2022-11-12 VITALS — BP 160/38 | HR 70 | Ht 67.0 in | Wt 189.2 lb

## 2022-11-12 DIAGNOSIS — I10 Essential (primary) hypertension: Secondary | ICD-10-CM | POA: Diagnosis not present

## 2022-11-12 DIAGNOSIS — I739 Peripheral vascular disease, unspecified: Secondary | ICD-10-CM | POA: Diagnosis not present

## 2022-11-12 DIAGNOSIS — E785 Hyperlipidemia, unspecified: Secondary | ICD-10-CM | POA: Diagnosis not present

## 2022-11-12 DIAGNOSIS — Z720 Tobacco use: Secondary | ICD-10-CM

## 2022-11-12 DIAGNOSIS — I358 Other nonrheumatic aortic valve disorders: Secondary | ICD-10-CM

## 2022-11-12 MED ORDER — CARVEDILOL 3.125 MG PO TABS
3.1250 mg | ORAL_TABLET | Freq: Two times a day (BID) | ORAL | 3 refills | Status: DC
Start: 2022-11-12 — End: 2023-08-05

## 2022-11-12 NOTE — Patient Instructions (Signed)
Medication Instructions:  START Carvedilol 3.125 mg twice daily  *If you need a refill on your cardiac medications before your next appointment, please call your pharmacy*   Lab Work: None ordered If you have labs (blood work) drawn today and your tests are completely normal, you will receive your results only by: MyChart Message (if you have MyChart) OR A paper copy in the mail If you have any lab test that is abnormal or we need to change your treatment, we will call you to review the results.   Testing/Procedures: None ordered   Follow-Up: At [facility], you and your health needs are our priority.  As part of our continuing mission to provide you with exceptional heart care, we have created designated Provider Care Teams.  These Care Teams include your primary Cardiologist (physician) and Advanced Practice Providers (APPs -  Physician Assistants and Nurse Practitioners) who all work together to provide you with the care you need, when you need it.  We recommend signing up for the patient portal called "MyChart".  Sign up information is provided on this After Visit Summary.  MyChart is used to connect with patients for Virtual Visits (Telemedicine).  Patients are able to view lab/test results, encounter notes, upcoming appointments, etc.  Non-urgent messages can be sent to your provider as well.   To learn more about what you can do with MyChart, go to [email or web address].    Your next appointment:   12 month(s)  Provider:   You may see Lorine Bears, MD or one of the following Advanced Practice Providers on your designated Care Team:   Nicolasa Ducking, NP

## 2022-11-12 NOTE — Progress Notes (Signed)
Office Visit    Patient Name: Shawntell Dixson Grupp Date of Encounter: 11/12/2022  Primary Care Provider:  Armando Gang, FNP Primary Cardiologist:  Lorine Bears, MD  Chief Complaint    62 y.o. female w/ a h/o PAD s/p bilat iliac stenting in 10/2013, hypertension, hyperlipidemia, type 2 diabetes mellitus (diet-controlled), tobacco abuse, and obesity, who presents for follow-up related to peripheral arterial disease.   Past Medical History    Past Medical History:  Diagnosis Date   Allergy    Anemia    Anxiety    Aortic valve sclerosis    a. w/ systolic murmur.   Diastolic dysfunction    a. 07/2014 Echo: EF 60-65%, no rwma, Gr1 DD. Mildly dil LA. Nl RV fxn; b. 11/2020 Echo: EF at 60-65% with grade 2 diastolic dysfunction, and mild aortic sclerosis.   Family history of early CAD    Heart murmur    History of stress test    a. 07/2014 Myoview: EF 55-65%. Small, mod apical defect w/o apical wma->likely apical thinning (vs apical infarct). No ischemia-->Low Risk   HOH (hard of hearing)    reads lips wears hearing aids   Hyperlipidemia    Hypertension    Peripheral vascular disease (HCC)    a. 10/2013 s/p dist Ao/bilat iliac kissing stents; b. 08/2019 ABI: R 1.05, L 1.07. Stable IVC/Iliac velocities w/ mild inc in LCIA and bilat Ext Iliacs; c. 09/2022 ABIs: R 0.99, L 1.1.   Substance abuse (HCC)    Quit drinking alcohol 6 years ago   Tobacco abuse    Past Surgical History:  Procedure Laterality Date   ABDOMINAL AORTAGRAM N/A 10/10/2013   Procedure: ABDOMINAL AORTAGRAM;  Surgeon: Iran Ouch, MD;  Location: [facility];  Service: Cardiovascular;  Laterality: N/A;   ABDOMINAL HYSTERECTOMY     CHOLECYSTECTOMY     COLONOSCOPY WITH PROPOFOL N/A 10/16/2015   Procedure: COLONOSCOPY WITH PROPOFOL;  Surgeon: Midge Minium, MD;  Location: [facility];  Service: Endoscopy;  Laterality: N/A;   LOWER EXTREMITY ANGIOGRAM Bilateral 10/10/2013   ILIAC   BILATERAL   BY DR ARDA    POLYPECTOMY  10/16/2015   Procedure: POLYPECTOMY;  Surgeon: Midge Minium, MD;  Location: [facility];  Service: Endoscopy;;   SKIN CANCER EXCISION      Allergies  Allergies  Allergen Reactions   Lisinopril     Rash    Penicillins Hives   Losartan Rash    History of Present Illness      62 y.o. y/o female with above complex past medical history including peripheral arterial disease, hypertension, hyperlipidemia, type 2 diabetes mellitus, tobacco abuse, and obesity. In September 2015, in the setting of severe claudication, she underwent bilateral kissing stent placement to the common iliac arteries/distal aorta. In June 2016, an echocardiogram was performed in the setting of finding of heart murmur. This showed an EF of 60-65% without regional wall motion abnormalities, and grade 1 diastolic dysfunction. The aortic valve was not well visualized. She also underwent stress testing in June 2016 which showed a small, moderate apical defect without apical wall motion abnormality, likely representing apical thinning. No ischemia was noted and the study was deemed low risk.   She was diagnosed with type II diabetes in 2019 with a blood glucose greater than 600.  She started on medications but reported improving her diet and was able to lose 80 pounds.  She was subsequently taken off of diabetes medications and subsequent A1c's have  been normal.  Most recent echo in October 2022 showed normal EF at 60-65% with grade 2 diastolic dysfunction, and mild aortic sclerosis.   Ms. Molzahn was last seen in clinic in 11/2021, at which time she was doing well.  ABI's in 09/2022 were normal.  She has done well without any claudication.  She has chronic dyspnea on exertion continues to smoke 2 packs of cigarettes a day.  She is not interested in quitting.  She denies chest pain, PND, orthopnea, palpitations, dizziness, syncope, edema, or early satiety.  Home Medications    Current Outpatient Medications   Medication Sig Dispense Refill   albuterol (VENTOLIN HFA) 108 (90 Base) MCG/ACT inhaler Inhale 2 puffs into the lungs every 6 (six) hours as needed for wheezing or shortness of breath. 18 g 4   ALPRAZolam (XANAX) 0.25 MG tablet Take 1 tablet (0.25 mg total) by mouth at bedtime as needed for anxiety. 90 tablet 0   amLODipine (NORVASC) 5 MG tablet TAKE 1 TABLET(5 MG) BY MOUTH IN THE MORNING AND AT BEDTIME 60 tablet 10   aspirin EC 81 MG tablet Take 1 tablet (81 mg total) by mouth daily. 90 tablet 3   atorvastatin (LIPITOR) 40 MG tablet Take 1 tablet (40 mg total) by mouth daily. 90 tablet 3   Blood Glucose Monitoring Suppl (ONETOUCH VERIO) w/Device KIT 1 kit by Does not apply route daily before breakfast. 1 kit 0   carvedilol (COREG) 3.125 MG tablet Take 1 tablet (3.125 mg total) by mouth 2 (two) times daily. 180 tablet 3   fluocinonide ointment (LIDEX) 0.05 % Apply 1 application topically 2 (two) times daily. 60 g 2   fluticasone (FLONASE) 50 MCG/ACT nasal spray Place 2 sprays into both nostrils daily. 48 g 3   fluticasone furoate-vilanterol (BREO ELLIPTA) 200-25 MCG/INH AEPB Inhale 1 puff into the lungs daily. 60 each 5   hydrochlorothiazide (HYDRODIURIL) 25 MG tablet TAKE 1 TABLET(25 MG) BY MOUTH DAILY 90 tablet 1   Lancets (ONETOUCH DELICA PLUS LANCET30G) MISC USE TO CHECK BLOOD SUGAR ONCE DAILY 100 each 12   mometasone (NASONEX) 50 MCG/ACT nasal spray Place 2 sprays into the nose daily. 17 g 12   ONETOUCH VERIO test strip TEST BLOOD SUGAR ONCE DAILY 100 strip 12   potassium chloride (KLOR-CON) 10 MEQ tablet TAKE 1 TABLET BY MOUTH DAILY 90 tablet 1   nystatin (MYCOSTATIN) 100000 UNIT/ML suspension Take 5 mLs (500,000 Units total) by mouth 4 (four) times daily. (Patient not taking: Reported on 11/12/2022) 60 mL 0   No current facility-administered medications for this visit.     Review of Systems    Chronic dyspnea exertion.  She denies chest pain, palpitations, PND, orthopnea, dizziness,  syncope, edema, claudication, or early satiety.  All other systems reviewed and are otherwise negative except as noted above.    Physical Exam    VS:  BP (!) 160/38 (BP Location: Left Arm, Cuff Size: Large)   Pulse 70   Ht 5\' 7"  (1.702 m)   Wt 189 lb 3.2 oz (85.8 kg)   SpO2 95%   BMI 29.63 kg/m  , BMI Body mass index is 29.63 kg/m.     Vitals:   11/12/22 1357 11/12/22 1415  BP: (!) 170/62 (!) 160/38  Pulse: 70   SpO2: 95%     GEN: Well nourished, well developed, in no acute distress. HEENT: normal. Neck: Supple, no JVD, carotid bruits, or masses. Cardiac: RRR, 2/6 systolic murmur at the upper sternal borders,  no rubs or gallops.  No clubbing, cyanosis, edema.  Radials 2+/PT 2+ and equal bilaterally.  Respiratory:  Respirations regular and unlabored, clear to auscultation bilaterally. GI: Soft, nontender, nondistended, BS + x 4. MS: no deformity or atrophy. Skin: warm and dry, no rash. Neuro:  Strength and sensation are intact. Psych: Normal affect.  Accessory Clinical Findings    ECG personally reviewed by me today -    Regular sinus rhythm.- no acute changes.  Lab Results  Component Value Date   WBC 14.3 (H) 03/03/2020   HGB 15.2 03/03/2020   HCT 43.5 03/03/2020   MCV 86 03/03/2020   PLT 406 03/03/2020   Lab Results  Component Value Date   CREATININE 0.83 03/03/2020   BUN 20 03/03/2020   NA 141 03/03/2020   K 4.0 03/03/2020   CL 102 03/03/2020   CO2 24 03/03/2020   Lab Results  Component Value Date   ALT 18 03/03/2020   AST 19 03/03/2020   ALKPHOS 76 03/03/2020   BILITOT 0.3 03/03/2020   Lab Results  Component Value Date   CHOL 135 03/03/2020   HDL 49 03/03/2020   LDLCALC 63 03/03/2020   LDLDIRECT 162.7 08/18/2011   TRIG 132 03/03/2020   CHOLHDL 2.5 12/20/2018    Lab Results  Component Value Date   HGBA1C 5.6 08/21/2020    Assessment & Plan    1.  Peripheral arterial disease: Status post bilateral common iliac stenting.  Recent ABIs were  normal in August 2024.  She is not experiencing claudication.  She continues to smoke and cessation has been advised, though she has not contemplating quitting at this time.  She remains on aspirin and statin therapy.  2.  Primary hypertension: Blood pressure elevated today.  Adding low-dose carvedilol.  I have asked her to follow blood pressures more closely at home.  She has follow-up with primary care next month and pressures can be reevaluated at that time as well.  3.  Hyperlipidemia: Remains on atorvastatin therapy.  Lipids are followed by primary care with plan for follow-up labs in a few weeks.  4.  Tobacco abuse: Continues to smoke 2 packs/day.  She is not interested in quitting.  Complete cessation advised.  5.  Systolic murmur/Ao sclerosis: Systolic murmur over the aortic area.  Echo 2022 showed aortic sclerosis without stenosis.  6.  Disposition: Follow-up ABIs in August 2025.  Follow-up in PV clinic in 1 year or sooner if necessary.  Nicolasa Ducking, NP 11/12/2022, 5:04 PM

## 2022-12-03 ENCOUNTER — Telehealth: Payer: Self-pay | Admitting: Cardiovascular Disease

## 2022-12-03 DIAGNOSIS — I10 Essential (primary) hypertension: Secondary | ICD-10-CM

## 2022-12-03 MED ORDER — AMLODIPINE BESYLATE 5 MG PO TABS: 5.0000 mg | ORAL_TABLET | Freq: Two times a day (BID) | ORAL | 5 refills | Status: DC

## 2022-12-03 NOTE — Telephone Encounter (Signed)
Pt c/o medication issue:  1. Name of Medication:   amLODipine (NORVASC) 5 MG tablet    2. How are you currently taking this medication (dosage and times per day)?    3. Are you having a reaction (difficulty breathing--STAT)? no  4. What is your medication issue? Patient calling to see if she suppose to continue taking this medication. If so she needs a new prescription sent. Please advise

## 2022-12-03 NOTE — Telephone Encounter (Signed)
Carvedilol added on top of prior medications, including amlodipine, in the setting of elevated BP.  Yes, she should cont amlodipine.  Please refill.

## 2022-12-03 NOTE — Telephone Encounter (Signed)
Patient has been made aware. Amlodipine refill has been sent in.

## 2022-12-03 NOTE — Telephone Encounter (Signed)
Left detailed message to call back.  After visit summary from last visit shows carvedilol 3.125mg  BID added but no mention to d/c amplodipine.

## 2022-12-14 IMAGING — CT CT CHEST LUNG CANCER SCREENING LOW DOSE W/O CM
2 of 5 series · 15 of 40 positions shown, 18 images · non-contrast
Comparison: No priors.

CLINICAL DATA: 60-year-old female current smoker with 70 pack-year
history of smoking. Lung cancer screening examination.



[Series 3: lung 1.00 · axial · 0.63mm/px · z∈[-1212,-927]mm · 12 of 315 slices shown, 15 images]
[im 15/315  mediastinal]
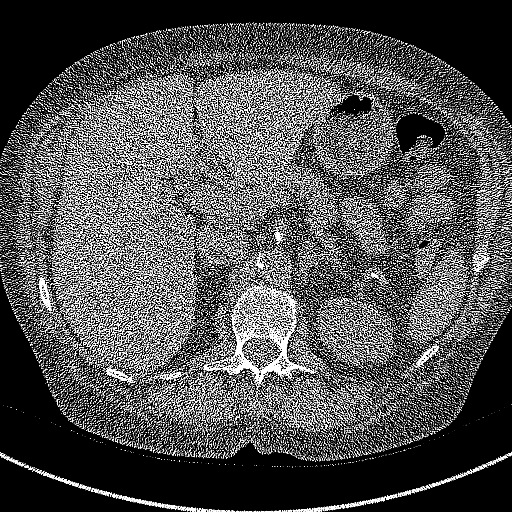
[im 15/315  lung]
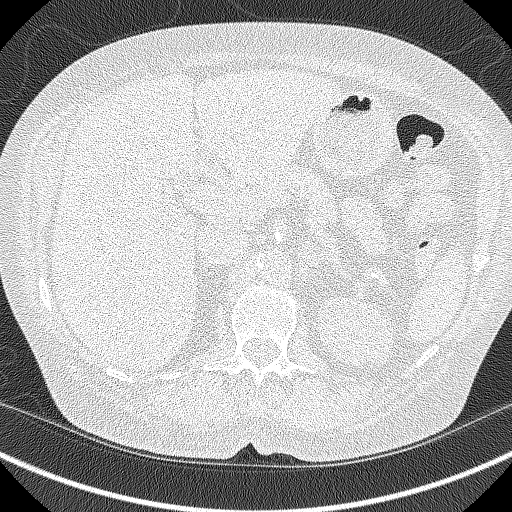
[im 43/315  lung]
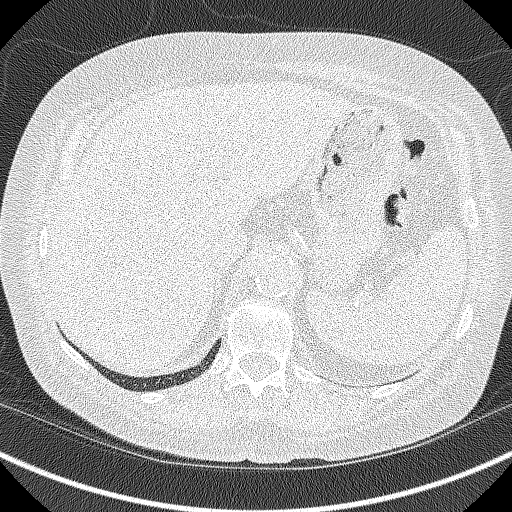
[im 72/315  lung]
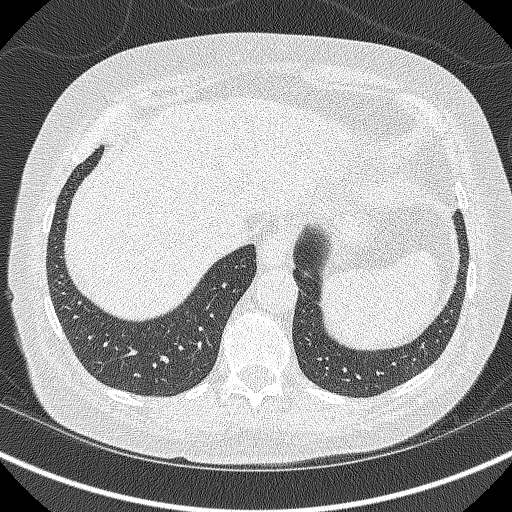
[im 100/315  lung]
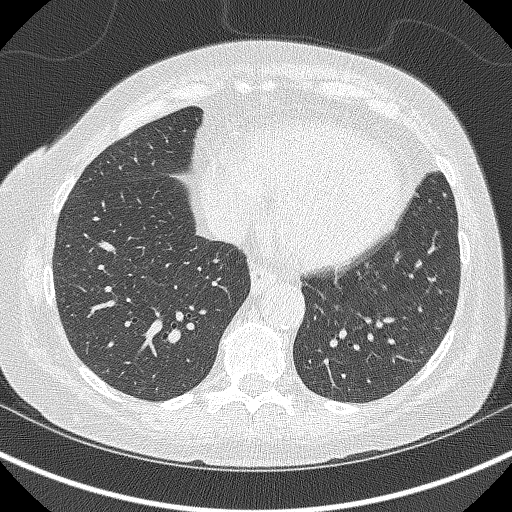
[im 115/315  mediastinal]
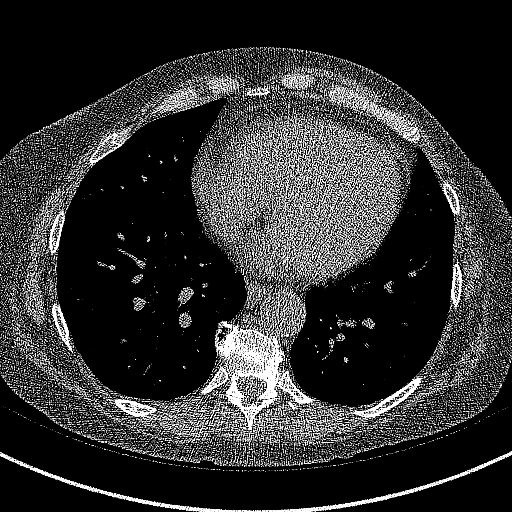
[im 115/315  lung]
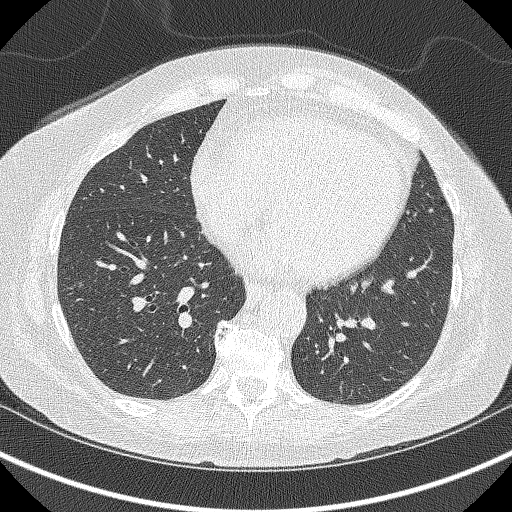
[im 143/315  lung]
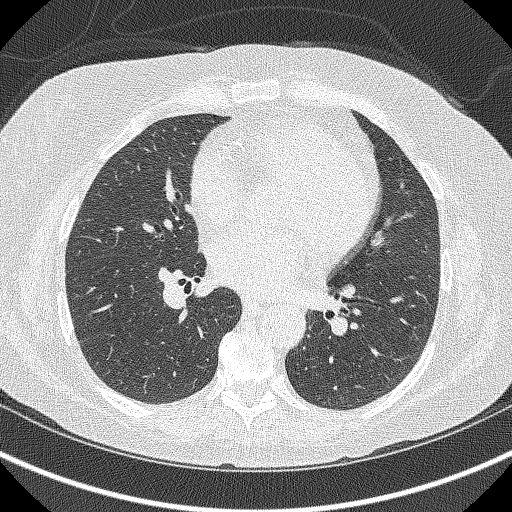
[im 172/315  lung]
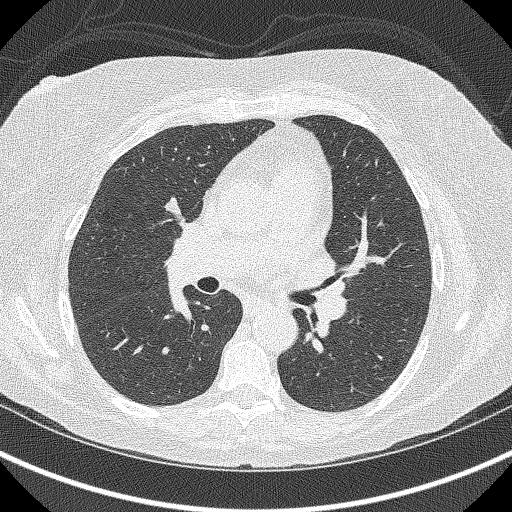
[im 200/315  lung]
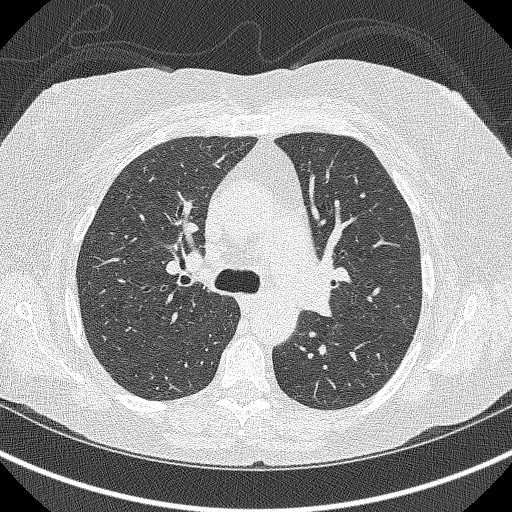
[im 215/315  mediastinal]
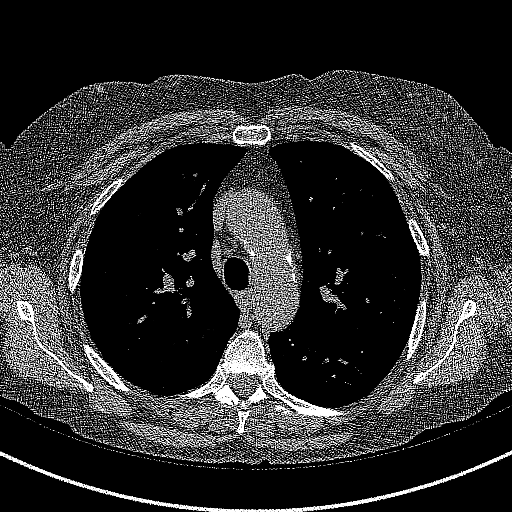
[im 215/315  lung]
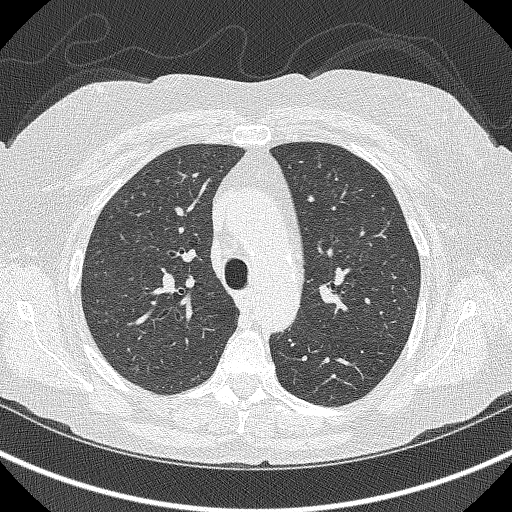
[im 243/315  lung]
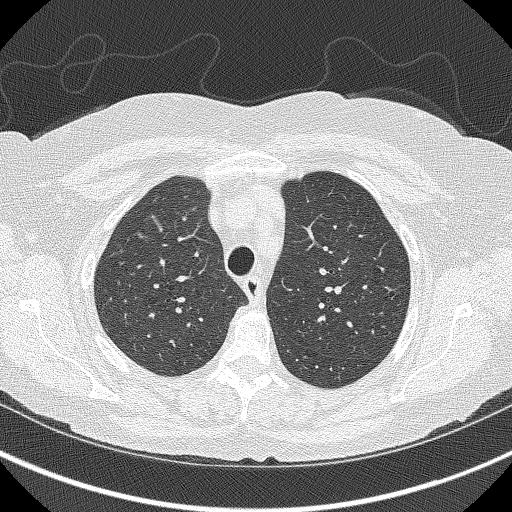
[im 272/315  lung]
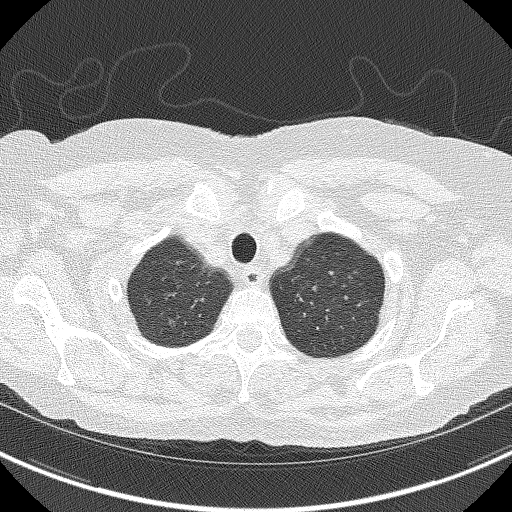
[im 300/315  lung]
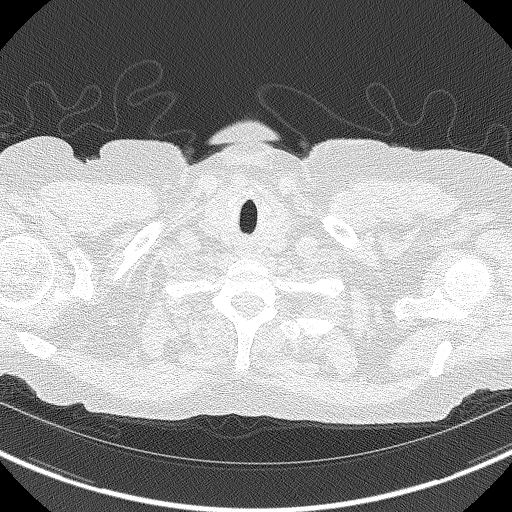

[Series 5: coronals lung 1.00 cor · coronal · 0.62mm/px · 3 of 298 slices shown]
[im 60/298  lung]
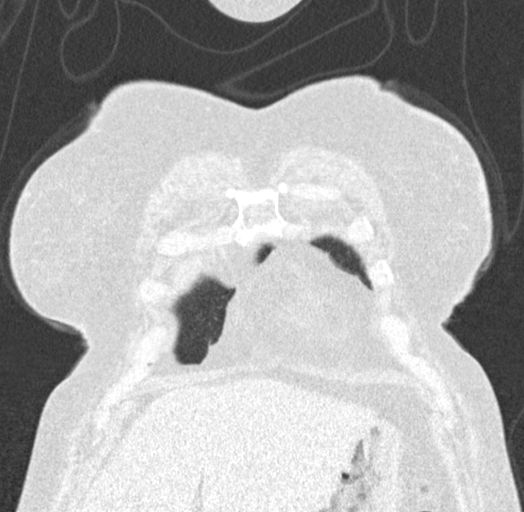
[im 119/298  lung]
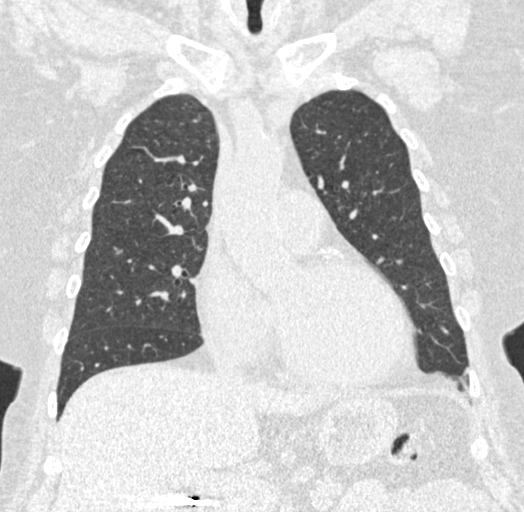
[im 179/298  lung]
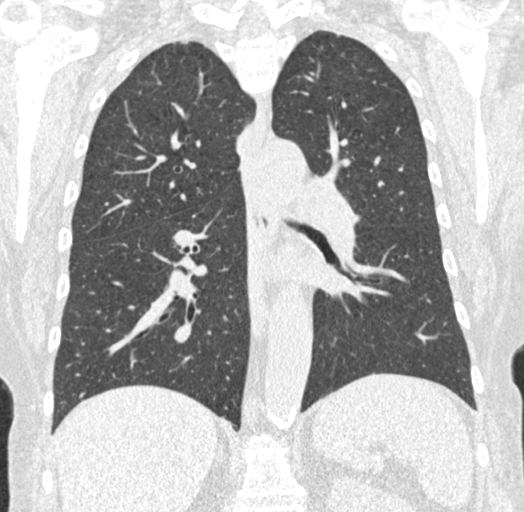

[15 of 40 positions shown; findings below may reference images not displayed]

FINDINGS: Cardiovascular: Heart size is normal. There is no significant
pericardial fluid, thickening or pericardial calcification. There is
aortic atherosclerosis, as well as atherosclerosis of the great
vessels of the mediastinum and the coronary arteries, including
calcified atherosclerotic plaque in the left main, left anterior
descending, left circumflex and right coronary arteries.

Mediastinum/Nodes: No pathologically enlarged mediastinal or hilar
lymph nodes. Esophagus is unremarkable in appearance. No axillary
lymphadenopathy.

Lungs/Pleura: Small right lower lobe pulmonary nodule (axial image
200 of series 3), with a volume derived mean diameter of 1.5 mm. No
other larger more suspicious appearing pulmonary nodules or masses
are noted. No acute consolidative airspace disease. No pleural
effusions. Mild diffuse bronchial wall thickening with mild
centrilobular and paraseptal emphysema.

Upper Abdomen: 3 mm nonobstructive calculus in the upper pole
collecting system of left kidney. Atherosclerosis in the abdominal
aorta. Status post cholecystectomy.

Musculoskeletal: There are no aggressive appearing lytic or blastic
lesions noted in the visualized portions of the skeleton.
IMPRESSION: 1. Lung-RADS 2S, benign appearance or behavior. Continue annual
screening with low-dose chest CT without contrast in 12 months.
2. The "S" modifier above refers to potentially clinically
significant non lung cancer related findings. Specifically, there is
aortic atherosclerosis, in addition to left main and three-vessel
coronary artery disease. Please note that although the presence of
coronary artery calcium documents the presence of coronary artery
disease, the severity of this disease and any potential stenosis
cannot be assessed on this non-gated CT examination. Assessment for
potential risk factor modification, dietary therapy or pharmacologic
therapy may be warranted, if clinically indicated.
3. Mild diffuse bronchial wall thickening with mild centrilobular
and paraseptal emphysema; imaging findings suggestive of underlying
COPD.
4. 3 mm nonobstructive calculus in the upper pole collecting system
of left kidney.

Aortic Atherosclerosis (DOOO6-Q4X.X) and Emphysema (DOOO6-WGU.Z).

## 2023-02-24 ENCOUNTER — Ambulatory Visit
Admission: RE | Admit: 2023-02-24 | Discharge: 2023-02-24 | Disposition: A | Discharge status: Home / Self Care | Payer: BC Managed Care – PPO | Source: Ambulatory Visit | Attending: Acute Care | Admitting: Acute Care

## 2023-02-24 DIAGNOSIS — Z122 Encounter for screening for malignant neoplasm of respiratory organs: Secondary | ICD-10-CM | POA: Insufficient documentation

## 2023-02-24 DIAGNOSIS — F17210 Nicotine dependence, cigarettes, uncomplicated: Secondary | ICD-10-CM | POA: Insufficient documentation

## 2023-02-24 DIAGNOSIS — Z87891 Personal history of nicotine dependence: Secondary | ICD-10-CM | POA: Insufficient documentation

## 2023-03-07 ENCOUNTER — Other Ambulatory Visit: Payer: Self-pay

## 2023-03-07 DIAGNOSIS — Z122 Encounter for screening for malignant neoplasm of respiratory organs: Secondary | ICD-10-CM

## 2023-03-07 DIAGNOSIS — Z87891 Personal history of nicotine dependence: Secondary | ICD-10-CM

## 2023-03-07 DIAGNOSIS — F17210 Nicotine dependence, cigarettes, uncomplicated: Secondary | ICD-10-CM

## 2023-04-26 ENCOUNTER — Other Ambulatory Visit: Payer: Self-pay | Admitting: Nurse Practitioner

## 2023-04-26 DIAGNOSIS — I10 Essential (primary) hypertension: Secondary | ICD-10-CM

## 2023-08-05 ENCOUNTER — Other Ambulatory Visit: Payer: Self-pay | Admitting: Nurse Practitioner

## 2023-08-30 ENCOUNTER — Other Ambulatory Visit: Payer: Self-pay | Admitting: Nurse Practitioner

## 2023-08-30 DIAGNOSIS — I10 Essential (primary) hypertension: Secondary | ICD-10-CM

## 2023-11-02 ENCOUNTER — Other Ambulatory Visit: Payer: Self-pay

## 2023-11-02 DIAGNOSIS — M7989 Other specified soft tissue disorders: Secondary | ICD-10-CM

## 2023-11-02 DIAGNOSIS — Z7689 Persons encountering health services in other specified circumstances: Secondary | ICD-10-CM

## 2023-11-04 ENCOUNTER — Ambulatory Visit
Admission: RE | Admit: 2023-11-04 | Discharge: 2023-11-04 | Disposition: A | Discharge status: Home / Self Care | Source: Ambulatory Visit

## 2023-11-04 DIAGNOSIS — Z7689 Persons encountering health services in other specified circumstances: Secondary | ICD-10-CM | POA: Insufficient documentation

## 2023-11-04 DIAGNOSIS — M7989 Other specified soft tissue disorders: Secondary | ICD-10-CM | POA: Diagnosis present

## 2023-11-04 MED ORDER — IOHEXOL 300 MG/ML  SOLN
75.0000 mL | Freq: Once | INTRAMUSCULAR | Status: AC | PRN
  Administered 2023-11-04: 75 mL via INTRAVENOUS

## 2023-11-16 ENCOUNTER — Ambulatory Visit: Attending: Nurse Practitioner | Admitting: Nurse Practitioner

## 2023-11-16 ENCOUNTER — Encounter: Payer: Self-pay | Admitting: Nurse Practitioner

## 2023-11-16 VITALS — BP 146/72 | HR 68 | Ht 67.0 in | Wt 184.1 lb

## 2023-11-16 DIAGNOSIS — I152 Hypertension secondary to endocrine disorders: Secondary | ICD-10-CM | POA: Diagnosis not present

## 2023-11-16 DIAGNOSIS — E785 Hyperlipidemia, unspecified: Secondary | ICD-10-CM

## 2023-11-16 DIAGNOSIS — I739 Peripheral vascular disease, unspecified: Secondary | ICD-10-CM | POA: Diagnosis not present

## 2023-11-16 DIAGNOSIS — E1159 Type 2 diabetes mellitus with other circulatory complications: Secondary | ICD-10-CM

## 2023-11-16 DIAGNOSIS — Z720 Tobacco use: Secondary | ICD-10-CM

## 2023-11-16 DIAGNOSIS — E1169 Type 2 diabetes mellitus with other specified complication: Secondary | ICD-10-CM | POA: Diagnosis not present

## 2023-11-16 MED ORDER — CARVEDILOL 6.25 MG PO TABS: 6.2500 mg | ORAL_TABLET | Freq: Two times a day (BID) | ORAL | 3 refills | Status: AC

## 2023-11-16 NOTE — Progress Notes (Signed)
 Office Visit    Patient Name: Sara Roy Date of Encounter: 11/16/2023  Primary Care Provider:  Roxanna Rocks, [place] Primary Cardiologist:  Deatrice Cage, MD    Chief Complaint    63 y.o. female w/ a h/o PAD s/p bilat iliac stenting in 10/2013, hypertension, hyperlipidemia, type 2 diabetes mellitus (diet-controlled), tobacco abuse, and obesity, who presents for follow-up related to peripheral arterial disease.   Past Medical History   Subjective   Past Medical History:  Diagnosis Date   Allergy    Anemia    Anxiety    Aortic valve sclerosis    a. w/ systolic murmur.   Diastolic dysfunction    a. 07/2014 Echo: EF 60-65%, no rwma, Gr1 DD. Mildly dil LA. Nl RV fxn; b. 11/2020 Echo: EF at 60-65% with grade 2 diastolic dysfunction, and mild aortic sclerosis.   Family history of early CAD    Heart murmur    History of stress test    a. 07/2014 Myoview: EF 55-65%. Small, mod apical defect w/o apical wma->likely apical thinning (vs apical infarct). No ischemia-->Low Risk   HOH (hard of hearing)    reads lips wears hearing aids   Hyperlipidemia    Hypertension    Peripheral vascular disease    a. 10/2013 s/p dist Ao/bilat iliac kissing stents; b. 08/2019 ABI: R 1.05, L 1.07. Stable IVC/Iliac velocities w/ mild inc in LCIA and bilat Ext Iliacs; c. 09/2022 ABIs: R 0.99, L 1.1.   Substance abuse (HCC)    Quit drinking alcohol 6 years ago   Tobacco abuse    Past Surgical History:  Procedure Laterality Date   ABDOMINAL AORTAGRAM N/A 10/10/2013   Procedure: ABDOMINAL AORTAGRAM;  Surgeon: Deatrice DELENA Cage, MD;  Location: [facility];  Service: Cardiovascular;  Laterality: N/A;   ABDOMINAL HYSTERECTOMY     CHOLECYSTECTOMY     COLONOSCOPY WITH PROPOFOL  N/A 10/16/2015   Procedure: COLONOSCOPY WITH PROPOFOL ;  Surgeon: Rogelia Copping, MD;  Location: [facility];  Service: Endoscopy;  Laterality: N/A;   LOWER EXTREMITY ANGIOGRAM Bilateral 10/10/2013   ILIAC   BILATERAL   BY DR ARDA    POLYPECTOMY  10/16/2015   Procedure: POLYPECTOMY;  Surgeon: Rogelia Copping, MD;  Location: [facility];  Service: Endoscopy;;   SKIN CANCER EXCISION      Allergies  Allergies  Allergen Reactions   Lisinopril      Rash    Penicillins Hives   Losartan  Rash       History of Present Illness      63 y.o. y/o female with above complex past medical history including peripheral arterial disease, hypertension, hyperlipidemia, type 2 diabetes mellitus, tobacco abuse, and obesity. In September 2015, in the setting of severe claudication, she underwent bilateral kissing stent placement to the common iliac arteries/distal aorta. In June 2016, an echocardiogram was performed in the setting of finding of heart murmur. This showed an EF of 60-65% without regional wall motion abnormalities, and grade 1 diastolic dysfunction. The aortic valve was not well visualized. She also underwent stress testing in June 2016 which showed a small to moderate apical defect without apical wall motion abnormality, likely representing apical thinning. No ischemia was noted and the study was deemed low risk.   She was diagnosed with type II diabetes in 2019 with a blood glucose greater than 600.  She started on medications but reported improving her diet and was able to lose 80 pounds.  She was subsequently taken off of  diabetes medications and subsequent A1c's have been normal.  Most recent echo in October 2022 showed normal EF at 60-65% with grade 2 diastolic dysfunction, and mild aortic sclerosis. Her most recent bilateral ABIs in August 2024 were normal with patent stents on US .      Sara Roy presents today in the office for yearly f/u and reports she is doing well. She has no symptoms of claudication. 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 She continues to smoke 1.5 -2 PPD with no real interest in quitting. She does report two incidences of hemoptysis in August 2025 with normal  CT of the head/neck in Sept. She denies chest pain, PND, orthopnea, palpitations, dizziness, syncope, edema or early satiety.  Objective   Home Medications    Prior to Admission medications   Medication Sig Start Date End Date Taking? Authorizing Provider  albuterol  (VENTOLIN  HFA) 108 (90 Base) MCG/ACT inhaler Inhale 2 puffs into the lungs every 6 (six) hours as needed for wheezing or shortness of breath. 02/28/20  Yes Vivienne Delon HERO, PA-C  ALPRAZolam  (XANAX ) 0.25 MG tablet Take 1 tablet (0.25 mg total) by mouth at bedtime as needed for anxiety. 08/21/20  Yes Bacigalupo, Angela M, MD  amLODipine  (NORVASC ) 5 MG tablet TAKE 1 TABLET(5 MG) BY MOUTH TWICE DAILY 04/26/23  Yes Vivienne Lonni Ingle, NP  aspirin  EC 81 MG tablet Take 1 tablet (81 mg total) by mouth daily. 10/26/13  Yes Darron Deatrice LABOR, MD  atorvastatin  (LIPITOR) 40 MG tablet Take 1 tablet (40 mg total) by mouth daily. 11/12/21  Yes Darron Deatrice LABOR, MD  Blood Glucose Monitoring Suppl (ONETOUCH VERIO) w/Device KIT 1 kit by Does not apply route daily before breakfast. 08/03/17  Yes Burnette, Jennifer M, PA-C  FARXIGA 10 MG TABS tablet Take 10 mg by mouth daily.   Yes [provider]  fluticasone  (FLONASE ) 50 MCG/ACT nasal spray Place 2 sprays into both nostrils daily. 01/12/19  Yes Vivienne Delon HERO, PA-C  fluticasone  furoate-vilanterol (BREO ELLIPTA ) 200-25 MCG/INH AEPB Inhale 1 puff into the lungs daily. 07/18/17  Yes Vivienne Delon HERO, PA-C  hydrochlorothiazide  (HYDRODIURIL ) 25 MG tablet TAKE 1 TABLET(25 MG) BY MOUTH DAILY 10/15/20  Yes Emilio Kelly DASEN, FNP  Lancets [facility] DELICA PLUS LANCET30G) MISC USE TO CHECK BLOOD SUGAR ONCE DAILY 09/06/18  Yes Burnette, Delon HERO, PA-C  ONETOUCH VERIO test strip TEST BLOOD SUGAR ONCE DAILY 09/07/19  Yes Burnette, Jennifer M, PA-C  potassium chloride  (KLOR-CON ) 10 MEQ tablet TAKE 1 TABLET BY MOUTH DAILY 06/16/20  Yes Chrismon, Marinda BRAVO, PA-C  carvedilol  (COREG ) 6.25 MG tablet Take  1 tablet (6.25 mg total) by mouth 2 (two) times daily with a meal. 11/16/23   Vivienne Lonni Ingle, NP  fluocinonide  ointment (LIDEX ) 0.05 % Apply 1 application topically 2 (two) times daily. Patient not taking: Reported on 11/16/2023 08/21/20   Bacigalupo, Angela M, MD  mometasone  (NASONEX ) 50 MCG/ACT nasal spray Place 2 sprays into the nose daily. Patient not taking: Reported on 11/16/2023 08/03/17   Vivienne Delon HERO, PA-C  nystatin  (MYCOSTATIN ) 100000 UNIT/ML suspension Take 5 mLs (500,000 Units total) by mouth 4 (four) times daily. Patient not taking: Reported on 11/16/2023 08/10/17   Vivienne Delon HERO, PA-C       Physical Exam    VS:  BP (!) 146/72   Pulse 68   Ht 5' 7 (1.702 m)   Wt 184 lb 2 oz (83.5 kg)   SpO2 97%  BMI 28.84 kg/m  , BMI Body mass index is 28.84 kg/m.    Vitals:   11/16/23 0802 11/16/23 1135  BP: (!) 150/60 (!) 146/72  Pulse: 68   SpO2: 97%           GEN: Well nourished, well developed, in no acute distress. HEENT: normal. Neck: Supple, no JVD, carotid bruits, or masses. Cardiac: RRR, 2/6 systolic murmur @ upper sternal border, no rubs, or gallops. No clubbing, cyanosis, edema.  Radials 2+. Right DP/PT 2+, left DP/PT 1+  Respiratory:  Respirations regular and unlabored, diminshed bilaterally throughout. GI: Soft, nontender, nondistended, BS + x 4. MS: no deformity or atrophy. Skin: warm and dry, no rash on exposed skin Neuro:  Strength and sensation are intact. Psych: Normal affect.  Accessory Clinical Findings    ECG personally reviewed by me today - EKG Interpretation Date/Time:  Wednesday November 16 2023 08:07:15 EDT Ventricular Rate:  68 PR Interval:  168 QRS Duration:  94 QT Interval:  412 QTC Calculation: 438 R Axis:   66  Text Interpretation: Sinus rhythm with PVC Confirmed by Vivienne Bruckner [phone]) on 11/16/2023 8:12:59 AM    Lab Results  Component Value Date   WBC 14.3 (H) 03/03/2020   HGB 15.2 03/03/2020   HCT 43.5  03/03/2020   MCV 86 03/03/2020   PLT 406 03/03/2020   Lab Results  Component Value Date   CREATININE 0.83 03/03/2020   BUN 20 03/03/2020   NA 141 03/03/2020   K 4.0 03/03/2020   CL 102 03/03/2020   CO2 24 03/03/2020   Lab Results  Component Value Date   ALT 18 03/03/2020   AST 19 03/03/2020   ALKPHOS 76 03/03/2020   BILITOT 0.3 03/03/2020   Lab Results  Component Value Date   CHOL 135 03/03/2020   HDL 49 03/03/2020   LDLCALC 63 03/03/2020   LDLDIRECT 162.7 08/18/2011   TRIG 132 03/03/2020   CHOLHDL 2.5 12/20/2018    Lab Results  Component Value Date   HGBA1C 5.6 08/21/2020   Lab Results  Component Value Date   TSH 0.635 03/03/2020       Assessment & Plan    1.  Peripheral arterial disease: Bilateral arterial stenting 10/2013. Her previous ABIs were normal (09/2022). No claudication symptoms. DP/PT> in R vs L. She continues to smoke 1.5-2ppd with no desire to quit. Remains on aspirin  and statin therapy. ABIs ordered. Smoking cessation advised.    2. Primary hypertension: Blood pressure elevated today 150/60. Repeat in office 146/72.  Carvedilol  was added at last visit 11/12/2022.  She does not take her blood pressure at home. Will increase Carvedilol  to 6.25 mg BID (no wheezing on exam). Allergies to lisinopril  and losartan , consider hydralazine if she continues to have poor control.   3. Hyperlipidemia: Remains on atorvastatin  therapy. Last lipids were 2022. She is scheduled to have labs at the end of the month with primary care.  4. Tobacco abuse: Continues to smoke 1.5-2ppd. No interest in quitting smoking. Cessation encouraged.   5. Systolic murmur: Known systolic murmur w/ hx of aortic sclerosis without stenosis. Last echo 10/22.   6. Hemoptysis: 2 episodes of hemoptysis in setting of cough in August 2025, w/ concern for neck swelling.  Follow up CT neck unremarkable. No further hemoptysis.  Followed by primary.   7. DMII:  On farxiga.  Pending f/u labs w/  PCP.  8.  Disoposition: f/u ABIs. Labs to be drawn by PCP 10/29.  F/u in  office in 1 year or sooner if necessary.   Lonni Meager, NP 11/16/2023, 11:36 AM

## 2023-11-16 NOTE — Patient Instructions (Signed)
 Medication Instructions:   Your physician recommends the following medication changes.  INCREASE: Carvedilol  6.25 mg 2 times daily   *If you need a refill on your cardiac medications before your next appointment, please call your pharmacy*  Lab Work:  None ordered at this time   If you have labs (blood work) drawn today and your tests are completely normal, you will receive your results only by:  MyChart Message (if you have MyChart) OR  A paper copy in the mail If you have any lab test that is abnormal or we need to change your treatment, we will call you to review the results.  Testing/Procedures:  Your physician has requested that you have an ankle brachial index (ABI). During this test an ultrasound and blood pressure cuff are used to evaluate the arteries that supply the arms and legs with blood.  Allow thirty minutes for this exam.  There are no restrictions or special instructions.  This will take place at [address] [facility] [address] [facility], [place] [address]  Please note: We ask at that you not bring children with you during ultrasound (echo/ vascular) testing. Due to room size and safety concerns, children are not allowed in the ultrasound rooms during exams. Our front office staff cannot provide observation of children in our lobby area while testing is being conducted. An adult accompanying a patient to their appointment will only be allowed in the ultrasound room at the discretion of the ultrasound technician under special circumstances. We apologize for any inconvenience.   Your physician has requested that you have an abdominal aorta duplex. During this test, an ultrasound is used to evaluate the aorta. Allow 30 minutes for this exam. Do not eat after midnight the day before and avoid carbonated beverages. This will take place at [address] [address], [place] [address]   Referrals:  None ordered at this time   Follow-Up:  At  [facility], you and your health needs are our priority.  As part of our continuing mission to provide you with exceptional heart care, our providers are all part of one team.  This team includes your primary Cardiologist (physician) and Advanced Practice Providers or APPs (Physician Assistants and Nurse Practitioners) who all work together to provide you with the care you need, when you need it.  Your next appointment:   1 year(s)  Provider:    Deatrice Cage, MD    We recommend signing up for the patient portal called MyChart.  Sign up information is provided on this After Visit Summary.  MyChart is used to connect with patients for Virtual Visits (Telemedicine).  Patients are able to view lab/test results, encounter notes, upcoming appointments, etc.  Non-urgent messages can be sent to your provider as well.   To learn more about what you can do with MyChart, go to [email or web address].

## 2023-11-28 ENCOUNTER — Other Ambulatory Visit: Payer: Self-pay | Admitting: Nurse Practitioner

## 2023-11-28 DIAGNOSIS — I10 Essential (primary) hypertension: Secondary | ICD-10-CM

## 2023-12-01 MED ORDER — AMLODIPINE BESYLATE 5 MG PO TABS: 5.0000 mg | ORAL_TABLET | Freq: Every day | ORAL | 3 refills | Status: DC

## 2023-12-02 ENCOUNTER — Other Ambulatory Visit: Payer: Self-pay

## 2023-12-02 DIAGNOSIS — I10 Essential (primary) hypertension: Secondary | ICD-10-CM

## 2023-12-02 MED ORDER — AMLODIPINE BESYLATE 5 MG PO TABS: 5.0000 mg | ORAL_TABLET | Freq: Every day | ORAL | 3 refills | Status: DC

## 2023-12-02 MED ORDER — AMLODIPINE BESYLATE 5 MG PO TABS: 5.0000 mg | ORAL_TABLET | Freq: Two times a day (BID) | ORAL | 3 refills | Status: AC

## 2023-12-30 ENCOUNTER — Ambulatory Visit: Attending: Nurse Practitioner

## 2023-12-30 ENCOUNTER — Ambulatory Visit (INDEPENDENT_AMBULATORY_CARE_PROVIDER_SITE_OTHER)

## 2023-12-30 DIAGNOSIS — I739 Peripheral vascular disease, unspecified: Secondary | ICD-10-CM

## 2024-01-01 LAB — VAS US ABI WITH/WO TBI
Left ABI: 1.09
Right ABI: 1.04

## 2024-01-02 ENCOUNTER — Ambulatory Visit: Payer: Self-pay | Admitting: Nurse Practitioner

## 2024-01-02 DIAGNOSIS — I739 Peripheral vascular disease, unspecified: Secondary | ICD-10-CM

## 2024-02-27 ENCOUNTER — Ambulatory Visit: Admission: RE | Admit: 2024-02-27 | Source: Ambulatory Visit

## 2024-02-28 ENCOUNTER — Ambulatory Visit
Admission: RE | Admit: 2024-02-28 | Discharge: 2024-02-28 | Disposition: A | Discharge status: Home / Self Care | Source: Ambulatory Visit | Attending: Acute Care | Admitting: Acute Care

## 2024-02-28 DIAGNOSIS — F17210 Nicotine dependence, cigarettes, uncomplicated: Secondary | ICD-10-CM | POA: Insufficient documentation

## 2024-02-28 DIAGNOSIS — Z122 Encounter for screening for malignant neoplasm of respiratory organs: Secondary | ICD-10-CM | POA: Diagnosis present

## 2024-02-28 DIAGNOSIS — Z87891 Personal history of nicotine dependence: Secondary | ICD-10-CM | POA: Diagnosis present

## 2024-03-06 ENCOUNTER — Other Ambulatory Visit: Payer: Self-pay

## 2024-03-06 DIAGNOSIS — Z122 Encounter for screening for malignant neoplasm of respiratory organs: Secondary | ICD-10-CM

## 2024-03-06 DIAGNOSIS — F17210 Nicotine dependence, cigarettes, uncomplicated: Secondary | ICD-10-CM

## 2024-03-06 DIAGNOSIS — Z87891 Personal history of nicotine dependence: Secondary | ICD-10-CM
